# Patient Record
Sex: Male | Born: 1960 | Race: White | Hispanic: No | Marital: Married | Smoking: Former smoker
Health system: Southern US, Community
[De-identification: ages and names within clinical notes are randomized; demographics above are authoritative.]

## PROBLEM LIST (undated history)

## (undated) DIAGNOSIS — G4730 Sleep apnea, unspecified: Secondary | ICD-10-CM

## (undated) DIAGNOSIS — T7840XA Allergy, unspecified, initial encounter: Secondary | ICD-10-CM

## (undated) DIAGNOSIS — E663 Overweight: Secondary | ICD-10-CM

## (undated) DIAGNOSIS — E782 Mixed hyperlipidemia: Secondary | ICD-10-CM

## (undated) DIAGNOSIS — I10 Essential (primary) hypertension: Secondary | ICD-10-CM

## (undated) DIAGNOSIS — K219 Gastro-esophageal reflux disease without esophagitis: Secondary | ICD-10-CM

## (undated) DIAGNOSIS — I219 Acute myocardial infarction, unspecified: Secondary | ICD-10-CM

## (undated) DIAGNOSIS — I2510 Atherosclerotic heart disease of native coronary artery without angina pectoris: Secondary | ICD-10-CM

## (undated) HISTORY — DX: Acute myocardial infarction, unspecified: I21.9

## (undated) HISTORY — PX: OTHER SURGICAL HISTORY: SHX169

## (undated) HISTORY — DX: Sleep apnea, unspecified: G47.30

## (undated) HISTORY — DX: Allergy, unspecified, initial encounter: T78.40XA

## (undated) HISTORY — DX: Atherosclerotic heart disease of native coronary artery without angina pectoris: I25.10

## (undated) HISTORY — DX: Essential (primary) hypertension: I10

## (undated) HISTORY — DX: Overweight: E66.3

## (undated) HISTORY — DX: Gastro-esophageal reflux disease without esophagitis: K21.9

## (undated) HISTORY — DX: Mixed hyperlipidemia: E78.2

## (undated) HISTORY — PX: CORONARY ANGIOPLASTY: SHX604

## (undated) HISTORY — PX: CORONARY STENT PLACEMENT: SHX1402

---

## 2006-02-13 DIAGNOSIS — I219 Acute myocardial infarction, unspecified: Secondary | ICD-10-CM

## 2006-02-13 HISTORY — DX: Acute myocardial infarction, unspecified: I21.9

## 2006-06-08 ENCOUNTER — Ambulatory Visit: Payer: Self-pay | Admitting: Cardiology

## 2006-06-13 ENCOUNTER — Encounter: Payer: Self-pay | Admitting: Physician Assistant

## 2006-06-14 ENCOUNTER — Encounter: Payer: Self-pay | Admitting: Physician Assistant

## 2006-06-14 ENCOUNTER — Ambulatory Visit: Payer: Self-pay | Admitting: Cardiology

## 2006-07-20 ENCOUNTER — Ambulatory Visit: Payer: Self-pay | Admitting: Cardiology

## 2006-09-21 ENCOUNTER — Encounter: Payer: Self-pay | Admitting: Cardiology

## 2007-02-15 ENCOUNTER — Encounter: Payer: Self-pay | Admitting: Physician Assistant

## 2007-02-26 ENCOUNTER — Ambulatory Visit: Payer: Self-pay | Admitting: Cardiology

## 2007-07-05 ENCOUNTER — Encounter: Payer: Self-pay | Admitting: Cardiology

## 2007-10-04 ENCOUNTER — Ambulatory Visit: Payer: Self-pay | Admitting: Cardiology

## 2008-10-07 DIAGNOSIS — I10 Essential (primary) hypertension: Secondary | ICD-10-CM | POA: Insufficient documentation

## 2008-10-07 DIAGNOSIS — E663 Overweight: Secondary | ICD-10-CM | POA: Insufficient documentation

## 2008-10-07 DIAGNOSIS — E785 Hyperlipidemia, unspecified: Secondary | ICD-10-CM | POA: Insufficient documentation

## 2008-10-07 DIAGNOSIS — E1159 Type 2 diabetes mellitus with other circulatory complications: Secondary | ICD-10-CM | POA: Insufficient documentation

## 2008-10-07 DIAGNOSIS — I2510 Atherosclerotic heart disease of native coronary artery without angina pectoris: Secondary | ICD-10-CM | POA: Insufficient documentation

## 2008-10-07 DIAGNOSIS — Z9861 Coronary angioplasty status: Secondary | ICD-10-CM

## 2008-10-08 ENCOUNTER — Encounter (INDEPENDENT_AMBULATORY_CARE_PROVIDER_SITE_OTHER): Payer: Self-pay | Admitting: *Deleted

## 2008-11-12 ENCOUNTER — Encounter: Payer: Self-pay | Admitting: Physician Assistant

## 2008-11-12 ENCOUNTER — Ambulatory Visit: Payer: Self-pay | Admitting: Cardiology

## 2008-11-12 DIAGNOSIS — Z716 Tobacco abuse counseling: Secondary | ICD-10-CM | POA: Insufficient documentation

## 2008-12-01 ENCOUNTER — Encounter (INDEPENDENT_AMBULATORY_CARE_PROVIDER_SITE_OTHER): Payer: Self-pay | Admitting: *Deleted

## 2009-06-01 ENCOUNTER — Encounter (INDEPENDENT_AMBULATORY_CARE_PROVIDER_SITE_OTHER): Payer: Self-pay | Admitting: *Deleted

## 2009-06-24 ENCOUNTER — Encounter: Payer: Self-pay | Admitting: Cardiology

## 2010-03-02 ENCOUNTER — Ambulatory Visit
Admission: RE | Admit: 2010-03-02 | Discharge: 2010-03-02 | Payer: Self-pay | Source: Home / Self Care | Attending: Cardiology | Admitting: Cardiology

## 2010-03-02 ENCOUNTER — Encounter (INDEPENDENT_AMBULATORY_CARE_PROVIDER_SITE_OTHER): Payer: Self-pay | Admitting: *Deleted

## 2010-03-02 ENCOUNTER — Encounter: Payer: Self-pay | Admitting: Cardiology

## 2010-03-02 DIAGNOSIS — R072 Precordial pain: Secondary | ICD-10-CM | POA: Insufficient documentation

## 2010-03-04 ENCOUNTER — Encounter: Payer: Self-pay | Admitting: Cardiology

## 2010-03-15 NOTE — Letter (Signed)
Summary: Certified Letter-Returned "Unclaimed"  Certified Letter-Returned "Unclaimed"   Imported By: Cyril Loosen, RN, BSN 07/16/2009 10:19:50  _____________________________________________________________________  External Attachment:    Type:   Image     Comment:   External Document  Appended Document: Certified Letter-Returned "Unclaimed" Left message for pt to call regarding lab work that has been pending since 11/12/09.

## 2010-03-15 NOTE — Letter (Signed)
Summary: Generic Engineer, agricultural at [facility] [address]   [place], [place] [address]   Phone: [phone]  Fax: [phone]        June 01, 2009 MRN: [id number]    Scott Hatfield [address] [place], [place]  [address]    Dear [name],   You are past due for lab work to check your cholesterol and liver function. This was actually due the first of October. However, it does not appear this has been done.   Please take the enclosed order to the [facility] AS SOON AS POSSIBLE to have this done. Do not eat or drink after midnight. If you will not be able to do this lab work or have done it with your primary MD, please notify our office so that we can properly document this in your chart.    Sincerely,  Cyril Loosen, RN, BSN  This letter has been electronically signed by your physician.

## 2010-03-17 NOTE — Letter (Signed)
Summary: Stress Echocardiography  [facility] [address]   [place], [place] [address]   Phone: [phone]  Fax: [phone]      [facility] Cardiovascular Services  Stress Echocardiography    Vivi Martens  Appointment Date:_  Appointment Time:_   Your doctor has ordered a stress echo to help determine the condition of your heart during exercise. If you take blood pressure medication, ask your doctor if you should take it the day of your test. You should not have anything to eat or drink at least 4 hours before your test is scheduled.  You will be asked to undress from the waist up and given a hospital gown to wear, so dress comfortably from the waist down for example: Sweat pants, shorts, or skirt Rubber soled lace up shoes (tennis shoes)  You will need to register at the Outpatient/Main Entrance at the hospital 15 minutes before your appointment time. It is a good idea to bring a copy of your order with you. They will direct you to the Cardiovascular Department on the third floor.   Plan on about an hour and a half  from registration to release from the hospital

## 2010-03-17 NOTE — Assessment & Plan Note (Signed)
Summary: 1 yr ful  Medications Added AMLODIPINE BESYLATE 5 MG TABS (AMLODIPINE BESYLATE) Take 1 tablet by mouth once a day      Allergies Added: NKDA  Visit Type:  Follow-up Primary Provider:  Prudy Feeler   History of Present Illness: the patient is a 50 year old male with history of single vessel coronary artery disease status post stent placement to the circumflex in 2008. The patient had a negative Cardiolite stress study in May of 2008 following his procedure. The patient is scheduled for a DOT physical and has been noted to have high blood pressures of 180/87. He also needs a stress test prior to this encounter.  Cardiovascularperspective the patient appears to be doing well. He does report occasional intermittent symptoms of chest discomfort which appeared to be nonexertional. Had been going on for a few months. They have been very short lived in duration. There has been some pain also in the left arm but no associated shortness of breath or diaphoresis. He reports also no palpitations or syncope.  Unfortunate patient continues to smoke. He also has not had his lipid panel checked and liver function tests in quite some time.  Preventive Screening-Counseling & Management  Alcohol-Tobacco     Smoking Status: current     Smoking Cessation Counseling: yes     Packs/Day: 1 PPD  Current Medications (verified): 1)  Metoprolol Succinate 25 Mg Xr24h-Tab (Metoprolol Succinate) .... Take One Tablet By Mouth Daily 2)  Simvastatin 80 Mg Tabs (Simvastatin) .... Take 1 Tablet By Mouth Once A Day At Bedtime 3)  Lisinopril-Hydrochlorothiazide 20-25 Mg Tabs (Lisinopril-Hydrochlorothiazide) .... Take 1 Tablet By Mouth Once A Day 4)  Aspirin 81 Mg Tbec (Aspirin) .... Take One Tablet By Mouth Daily 5)  Fish Oil 1000 Mg Caps (Omega-3 Fatty Acids) .... Take 1 Tablet By Mouth Once A Day 6)  Nitroglycerin 0.4 Mg Subl (Nitroglycerin) .... One Tablet Under Tongue Every 5 Minutes As Needed For Chest  Pain---May Repeat Times Three 7)  Amlodipine Besylate 5 Mg Tabs (Amlodipine Besylate) .... Take 1 Tablet By Mouth Once A Day  Allergies (verified): No Known Drug Allergies  Comments:  Nurse/Medical Assistant: The patient's medication bottles and allergies were reviewed with the patient and were updated in the Medication and Allergy Lists.  Past History:  Family History: Last updated: 10/07/2008 Family History of Coronary Artery Disease:   Social History: Last updated: 10/07/2008 Full Time Married  Tobacco Use - Yes.  Alcohol Use - yes Regular Exercise - no Drug Use - no  Risk Factors: Smoking Status: current (03/02/2010) Packs/Day: 1 PPD (03/02/2010)  Past Medical History: OVERWEIGHT/OBESITY (ICD-278.02) HYPERTENSION, UNSPECIFIED (ICD-401.9) HYPERLIPIDEMIA-MIXED (ICD-272.4) CAD, NATIVE VESSEL (ICD-414.01) Single-vessel coronary artery disease with stent placement to the circumflex coronary artery in 2008 May 2008 negative stress Cardiolite study. GERD  Social History: Packs/Day:  1 PPD  Review of Systems       The patient complains of chest pain.  The patient denies fatigue, malaise, fever, weight gain/loss, vision loss, decreased hearing, hoarseness, palpitations, shortness of breath, prolonged cough, wheezing, sleep apnea, coughing up blood, abdominal pain, blood in stool, nausea, vomiting, diarrhea, heartburn, incontinence, blood in urine, muscle weakness, joint pain, leg swelling, rash, skin lesions, headache, fainting, dizziness, depression, anxiety, enlarged lymph nodes, easy bruising or bleeding, and environmental allergies.    Vital Signs:  Patient profile:   50 year old male Height:      71 inches Weight:      260 pounds BMI:  36.39 Pulse rate:   87 / minute BP sitting:   149 / 97  (left arm) Cuff size:   large  Vitals Entered By: Carlye Grippe (March 02, 2010 8:24 AM)  Nutrition Counseling: Patient's BMI is greater than 25 and therefore  counseled on weight management options.  Physical Exam  Additional Exam:  General: Well-developed, well-nourished in no distress head: Normocephalic and atraumatic eyes PERRLA/EOMI intact, conjunctiva and lids normal nose: No deformity or lesions mouth normal dentition, normal posterior pharynx neck: Supple, no JVD.  No masses, thyromegaly or abnormal cervical nodes lungs: Normal breath sounds bilaterally without wheezing.  Normal percussion heart: regular rate and rhythm with normal S1 and S2, no S3 or S4.  PMI is normal.  No pathological murmurs abdomen: Normal bowel sounds, abdomen is soft and nontender without masses, organomegaly or hernias noted.  No hepatosplenomegaly musculoskeletal: Back normal, normal gait muscle strength and tone normal pulsus: Pulse is normal in all 4 extremities Extremities: No peripheral pitting edema neurologic: Alert and oriented x 3 skin: Intact without lesions or rashes cervical nodes: No significant adenopathy psychologic: Normal affect    EKG  Procedure date:  03/02/2010  Findings:      normal sinus rhythm. Nonspecific ST-T wave changes. Heart rate 90 beats per minute  Impression & Recommendations:  Problem # 1:  HYPERTENSION, UNSPECIFIED (ICD-401.9) blood pressure poorly controlled. We will add amlodipine 5 mg p.o. q. daily. His updated medication list for this problem includes:    Metoprolol Succinate 25 Mg Xr24h-tab (Metoprolol succinate) .Marland Kitchen... Take one tablet by mouth daily    Lisinopril-hydrochlorothiazide 20-25 Mg Tabs (Lisinopril-hydrochlorothiazide) .Marland Kitchen... Take 1 tablet by mouth once a day    Aspirin 81 Mg Tbec (Aspirin) .Marland Kitchen... Take one tablet by mouth daily    Amlodipine Besylate 5 Mg Tabs (Amlodipine besylate) .Marland Kitchen... Take 1 tablet by mouth once a day  Problem # 2:  PRECORDIAL PAIN (ICD-786.51) chest pain appears atypical. 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 I have ordered an exercise echocardiogram and specifically told the patient not to take his beta blocker the day before and today off the procedure. He can continue his other antihypertensive medications prior to stress testing. His updated medication list for this problem includes:    Metoprolol Succinate 25 Mg Xr24h-tab (Metoprolol succinate) .Marland Kitchen... Take one tablet by mouth daily    Lisinopril-hydrochlorothiazide 20-25 Mg Tabs (Lisinopril-hydrochlorothiazide) .Marland Kitchen... Take 1 tablet by mouth once a day    Aspirin 81 Mg Tbec (Aspirin) .Marland Kitchen... Take one tablet by mouth daily    Nitroglycerin 0.4 Mg Subl (Nitroglycerin) ..... One tablet under tongue every 5 minutes as needed for chest pain---may repeat times three    Amlodipine Besylate 5 Mg Tabs (Amlodipine besylate) .Marland Kitchen... Take 1 tablet by mouth once a day  Orders: Echo- Stress (Stress Echo)  Problem # 3:  CAD, NATIVE VESSEL (ICD-414.01) single-vessel coronary artery disease with stent placement to circumflex coronary artery 2008. 12-lead electrocardiogram in the office today shows no acute ischemic changes. His updated medication list for this problem includes:    Metoprolol Succinate 25 Mg Xr24h-tab (Metoprolol succinate) .Marland Kitchen... Take one tablet by mouth daily    Lisinopril-hydrochlorothiazide 20-25 Mg Tabs (Lisinopril-hydrochlorothiazide) .Marland Kitchen... Take 1 tablet by mouth once a day    Aspirin 81 Mg Tbec (Aspirin) .Marland Kitchen... Take one tablet by mouth daily    Nitroglycerin 0.4 Mg Subl (Nitroglycerin) ..... One tablet under  tongue every 5 minutes as needed for chest pain---may repeat times three    Amlodipine Besylate 5 Mg Tabs (Amlodipine besylate) .Marland Kitchen... Take 1 tablet by mouth once a day  Orders: EKG w/ Interpretation ([address]) T-Lipid Profile ([address]-60454) T-Hepatic Function ([address]11-91478) Echo- Stress (Stress Echo)  Problem # 4:  HYPERLIPIDEMIA-MIXED (ICD-272.4) lipid panel and LFTs will be obtained. His updated medication list for this  problem includes:    Simvastatin 80 Mg Tabs (Simvastatin) .Marland Kitchen... Take 1 tablet by mouth once a day at bedtime  Orders: T-Lipid Profile ([address]562-13086) T-Hepatic Function [phone])  Patient Instructions: 1)  Begin Amlodipine 5mg  dailyl 2)  Stress Echo for DOT  3)  Labs:  FLP, LFT 4)  Follow up in  6 months Prescriptions: NITROGLYCERIN 0.4 MG SUBL (NITROGLYCERIN) One tablet under tongue every 5 minutes as needed for chest pain---may repeat times three  #25 x 2   Entered by:   Hoover Brunette, LPN   Authorized by:   Lewayne Bunting, MD, [facility]   Signed by:   Hoover Brunette, LPN on 28/41/3244   Method used:   Electronically to        [facility]  [address] (retail)       [address]       [place], [place]  [address]       Ph: [phone]       Fax: [phone]   RxID:   [id number] LISINOPRIL-HYDROCHLOROTHIAZIDE 20-25 MG TABS (LISINOPRIL-HYDROCHLOROTHIAZIDE) Take 1 tablet by mouth once a day  #30 Each x 6   Entered by:   Hoover Brunette, LPN   Authorized by:   Lewayne Bunting, MD, [facility]   Signed by:   Hoover Brunette, LPN on 63/02/6008   Method used:   Electronically to        [facility]  [address] (retail)       [address]       [place], [place]  [address]       Ph: [phone]       Fax: [phone]   RxID:   [id number] SIMVASTATIN 80 MG TABS (SIMVASTATIN) Take 1 tablet by mouth once a day at bedtime  #30 Each x 6   Entered by:   Hoover Brunette, LPN   Authorized by:   Lewayne Bunting, MD, [facility]   Signed by:   Hoover Brunette, LPN on 94/85/4627   Method used:   Electronically to        [facility]  [address] (retail)       [address]       [place], [place]  [address]       Ph: [phone]       Fax: [phone]   RxID:   [id number] METOPROLOL SUCCINATE 25 MG XR24H-TAB (METOPROLOL SUCCINATE) Take one tablet by mouth daily  #30 Each x 6   Entered by:   Hoover Brunette, LPN   Authorized by:   Lewayne Bunting, MD, [facility]   Signed by:   Hoover Brunette, LPN on  23/53/6144   Method used:   Electronically to        [facility]  [address] (retail)       [address]       [place], [place]  [address]       Ph: [phone]  Fax: [phone]   RxID:   [id number] AMLODIPINE BESYLATE 5 MG TABS (AMLODIPINE BESYLATE) Take 1 tablet by mouth once a day  #30 x 6   Entered by:   Hoover Brunette, LPN   Authorized by:   Lewayne Bunting, MD, [facility]   Signed by:   Hoover Brunette, LPN on 02/15/7251   Method used:   Electronically to        [facility]  [address] (retail)       [address]       [place], [place]  [address]       Ph: [phone]       Fax: [phone]   RxID:   [id number]   Handout requested.

## 2010-06-28 NOTE — Assessment & Plan Note (Signed)
[facility]                          [facility] NOTE   Scott Hatfield, Scott Hatfield                       MRN:          [id number]  DATE:10/04/2007                            DOB:          11-11-1960    PRIMARY CARDIOLOGIST:  Learta Codding, MD,FACC   REASON FOR VISIT:  A 51-month followup.   Scott Hatfield reports no interim development of symptoms suggestive of  unstable angina pectoris.  He has lost 16 pounds since his last visit.  Unfortunately, he continues to smoke.  He is otherwise compliant with  his medications.  He also continues to work as a Naval architect, but  reports that his hours have been diminished somewhat.   EKG in our office today shows  NSR at 80 bpm with nonspecific ST  changes.   CURRENT MEDICATIONS:  1. Plavix.  2. Aspirin 81 daily.  3. Toprol-XL 25 daily.  4. Lisinopril and hydrochlorothiazide 20/25 daily.  5. Simvastatin 80 daily.  6. Fish oil 1000 daily.   PHYSICAL EXAMINATION:  VITAL SIGNS:  Blood pressure 134/85, pulse 78,  regular, weight 259 (down 16).  GENERAL:  A 50 year old male, moderately obese, sitting upright, no  distress.  HEENT:  Normocephalic and atraumatic.  NECK:  Palpable bilateral carotid pulses without bruits; no JVD.  LUNGS:  Diminished breath sounds at bases, but without crackles or  wheezes.  HEART:  RRR (S1S2), no significant murmurs.  ABDOMEN:  Protuberant, nontender.  EXTREMITIES:  No significant edema.  NEURO:  No focal deficit.   IMPRESSION:  1. Single-vessel CAD, quiescent.      a.     Status post drug-eluting stenting of circumflex artery,       February 2008, [place], [place].      b.     Preserved LVF.      c.     Normal adequate exercise stress Cardiolite; EF 57%, May       2008.  2. Hypertension.  3. Dyslipidemia.  4. Ongoing tobacco.  5. Gastroesophageal reflux disease.  6. Obesity.   PLAN:  1. Complete current supply of Plavix, then stop.  The patient is now      one and half years out since undergoing drug-eluting stenting.  He      has significant financial constraints and, therefore, I feel that      he does not need to continue on this medication.  He has satisfied      the minimal requirements for remaining on Plavix for at least 1      year, as had been outlined at the time of his procedure.  He is,      however, to remain on low-dose aspirin indefinitely.  2. Aggressive lipid management as recommended.  Most recent profile      from this past May shows an LDL of 140 with an HDL of 24,      triglyceride 122, and cholesterol of 188.  He is currently on      simvastatin 80 daily and 1 g of fish oil.  We will increase fish  oil to 2 g a day and repeat a FLP/LFT in 12 weeks.  3. The patient is strongly advised to cut back on his saturated fat      intake.  He is encouraged to engage in a regular exercise program.  4. Prescribe Nicoderm transdermal nicotine patches (21 mg/hour), to      avail himself of the program which is currently being supported by      [facility].  5. Renew prescription for p.r.n. nitroglycerin.  6. Schedule return clinic follow up with myself and Dr. Andee Lineman in 1      year, or sooner as needed.      Rozell Searing, PA-C  Electronically Signed      Luis Abed, MD, [facility]  Electronically Signed   GS/MedQ  DD: 10/04/2007  DT: 10/05/2007  Job #: [id number]   cc:   Samuel Jester

## 2010-06-28 NOTE — Assessment & Plan Note (Signed)
[facility]                          [facility] NOTE   LUZ, BURCHER                       MRN:          [id number]  DATE:07/20/2006                            DOB:          1960-12-04    HISTORY OF PRESENT ILLNESS:  The patient is a 50 year old male with a  history of single-vessel coronary artery disease.  Patient is status  post stent placement to the circumflex at [facility].  The  patient is on aspirin and Plavix.  He has been doing well.  He did  report substernal chest pain during his last office visit, and was set  up for a Cardiolite perfusion study.  The ejection fraction was 57%, and  there was no ischemia.  The patient stated that the Saturday his stress  test, he had a single episode of chest pain lasting less than an hour.  However, he reports no recurrence, and no exertional chest pain.  He  also has no shortness of breath, orthopnea, or PND.  He was also started  on beta blocker last office visit, as well as Nexium.  He stated he is  doing quite well now.   MEDICATIONS:  1. Simvastatin 40 mg nightly.  2. Chantix 1mg  p.o. b.i.d.  3. Plavix 75 mg daily.  4. Lisinopril/hydrochlorothiazide 20/25 daily.  5. Aspirin 81 daily.  6. Toprol XL 25 daily.  7. Nexium 40 mg p.o. daily.   PHYSICAL EXAMINATION:  VITAL SIGNS:  Blood pressure 116/77.  Heart rate  85.  Weight is 265 pounds.  GENERAL:  A well-nourished white male, in no apparent distress.  HEENT:  Eyes are clear.  Conjunctivae clear.  NECK:  Supple.  Normal carotid upstroke.  No carotid bruits.  LUNGS:  Clear breath sounds bilaterally.  HEART:  Regular rate and rhythm.  Normal S1 and S2.  ABDOMEN:  Soft.  EXTREMITY EXAM:  No edema.   PROBLEM LIST:  1. Single-vessel coronary artery disease.      a.     Status post drug-eluting stent to the circumflex in       [place], [place] February 2008.      b.     Preserved left ventricular  function.      c.     Negative Cardiolite for ischemia 1 week ago.  2. Chest pain, resolved.  3. Hypertension.  4. Dyslipidemia.  5. Tobacco abuse.   PLAN:  1. Lipid panel was reviewed.  His LDL is 112.  He will continue to      adjust his diet.  We will check his lipids in 8 weeks.  If still      elevated LDL, we will increase simvastatin.  2. The patient's chest pain has resolved, and had no ischemic by      Cardiolite.  We will continue with current medical therapy.  3. The patient will follow up with [place] in 6 months.     Learta Codding, MD,FACC  Electronically Signed    GED/MedQ  DD: 07/20/2006  DT: 07/20/2006  Job #: [id number]   cc:  Dr. Charm Barges

## 2010-06-28 NOTE — Assessment & Plan Note (Signed)
[facility]                          [facility]   IRAM, LUNDBERG                       MRN:          [id number]  DATE:02/26/2007                            DOB:          [birth year]    HISTORY OF PRESENT ILLNESS:  Patient is a 50 year old male with history  of single vessel coronary artery status post stent placement to  circumflex coronary artery at [facility].  The patient had  last year a Cardiolite study done which was negative for ischemia.  Since that time he had one single episode of left-sided arm pain and  chest pain.  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  An EKG was done  which showed no acute ischemic changes.  The patient states he has been  doing well.  He has had no recurrent substernal chest on exertion.  Unfortunately, he is noncompliant with medical therapy, particularly his  cholesterol-lowering medications, and he also continues to smoke.  He  had recent laboratory work done including lipid panel, liver function  test and has demonstrated an LDL of 160 and a total cholesterol of 221,  HDL of 25.   Remainder of review of systems essentially within normal limits.  The  patient denies any orthopnea or PND.  I did note that his blood pressure  was elevated today but the patient assured me that on his blood pressure  cuff at home his readings have been otherwise within normal limits.   MEDICATIONS:  1. Simvastatin 40 mg p.o. nightly but the patient is not compliant.  2. Plavix 75 mg p.o. daily.  3. Lisinopril/hydrochlorothiazide 25 mg p.o. daily.  4. Aspirin 81 mg a day.  5. Toprol XL 25 mg a day.  6. Fish oil 1000 mg p.o. daily.   PHYSICAL EXAMINATION:  VITAL SIGNS:  Blood pressure 152/94, heart rate  89, weight 275 pounds.  NECK:  Normal carotid upstroke and no carotid bruits.  LUNGS:  Clear breath sounds bilaterally.  HEART:  Regular rate and rhythm with normal S1-S2.  No murmur, rubs or  gallops.  ABDOMEN:  Soft, nontender, no rebound or guarding.  Good bowel sounds.  EXTREMITY:  No cyanosis, clubbing or edema.   A 12 lead echocardiogram normal sinus rhythm with nonspecific T-wave  changes.   PROBLEM LIST:  1. Single vessel coronary artery disease.      a.     Status post drug-eluting stent to the circumflex in       [place], [place] February 2008.      b.     Normal left ventricular function.      c.     Negative Cardiolite stress study 1 year ago.  2. Atypical chest pain, resolved.  3. Hypertension.  4. Dyslipidemia.  5. Ongoing tobacco use.   PLAN:  The patient has had no recurrent substernal chest pain for the  last several weeks.  His EKG is within normal limits.  He does not need  any further stress testing at the present time.  1. The most important issues are the patient's noncompliance with his  statin drug therapy and his diet.  I have again stressed to the      patient that he needs to change his lifestyle and stop smoking.  He      also needs to start an exercise program.  The patient will increase      simvastatin to 80 mg p.o. nightly.  2. The patient will follow-up also with [place] in 6 months.  I asked him      to carefully review his blood pressure readings at home and to call      [place] back in case the blood pressure is elevated and we will give him      prescription of amlodipine 10 mg daily.     Learta Codding, MD,FACC  Electronically Signed    GED/MedQ  DD: 02/26/2007  DT: 02/26/2007  Job #: [id number]   cc:   Dr. Charm Barges

## 2010-07-01 NOTE — Assessment & Plan Note (Signed)
[facility] ([facility]) HEALTHCARE                          [facility] NOTE   Scott Hatfield, Scott Hatfield                       MRN:          [id number]  DATE:06/08/2006                            DOB:          [birth year]    REFERRING PHYSICIAN:  Samuel Jester   REASON FOR CONSULTATION:  Scott Hatfield is a very pleasant 50 year old male  with no prior history of heart disease, but with recently-documented  single-vessel coronary artery disease, treated with a drug eluting stent  of the circumflex artery at [facility] [place].   Patient has numerous cardiac risk factors, including tobacco-smoking,  and apparently was in [place] to be with his daughter, who was  expecting her first child at that time.  While he was in the hospital,  and in fact, was getting ready to leave, he experienced severe chest  pain with radiation down the left arm and was subsequently evaluated in  the emergency room.  He recalls having relief after the second  nitroglycerin tablet and was instructed to be admitted for overnight  observation, given that his resting electrocardiogram showed some  abnormalities, as well.  He was then cleared to proceed with an exercise  stress test, and he denies any associated chest pain, but does recall  feeling quite fatigued.  The perfusion images apparently were abnormal  and he was referred for a cardiac catheterization the following day,  revealing a significant mid-circumflex lesion.  A photo of the results,  as well as the accompanying DVD, have been provided, but the hospital  records are currently pending.   Since his hospitalization, patient denies any exertional chest  discomfort; however, he has had at least two episodes of chest pain,  which have been particularly significant and which he feels are new.  However, these both occurred at rest and the last one was about a week  ago, while he was  lying on the couch.  It is not similar to the  heartburn that he has experienced in the past, nor to the symptoms that  he had prior to his stenting.  Of note, there was no radiation down the  left arm, however, as there had been in the past.  The episode of last  week lasted approximately a few hours and he has not had any recurrent  symptoms.  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  He is also trying very hard to stop smoking and was discharged on  Chantix.   Of note, patient was not discharged with p.r.n. nitroglycerin.   ALLERGIES:  No known drug allergies.   CURRENT MEDICATIONS:  1. Plavix.  2. Aspirin 81 daily.  3. Lisinopril/HCTZ 20/25 daily.  4. Chantix as directed.  5. Simvastatin 40 daily.   PAST MEDICAL HISTORY:  1. Coronary artery disease.      a.     As outlined above.  2. Hypertension.  3. Hyperlipidemia.  4. Obesity.  5. GERD.   SOCIAL HISTORY:  Patient is married, has a total of three children.  He  has  resumed working as an Tax inspector.  He continues to smoke,  but is taking Chantix.  He drinks alcohol on occasion.   FAMILY HISTORY:  Mother age 37, status post myocardial infarction in her  early fifties.   REVIEW OF SYSTEMS:  Patient denies any current symptoms suggestive of  reflux disease.  Otherwise as per HPI.  Remaining systems negative.   PHYSICAL EXAM:  Blood pressure currently 147/85, pulse 91, weight 266.8.  GENERAL:  A 50 year old male, obese, sitting upright, in no distress.  HEENT:  Normocephalic, atraumatic.  NECK:  Palpable bilateral carotid pulses, without bruits.  LUNGS:  Clear to auscultation in all fields.  HEART:  Regular rate and rhythm (S1, S2).  No significant murmurs.  ABDOMEN:  Protuberant, nontender, with intact bowel sounds.  EXTREMITIES:  Palpable bilateral femoral pulses without bruits.  No  hematoma or ecchymoses overlying the right groin.  Distal extremities  with intact pulses and no  edema.  NEUROLOGIC:  No focal deficits.   IMPRESSION:  1. Single-vessel coronary artery disease.      a.     Status post drug eluting stent, mid-circumflex artery, in       [place], [place], February 2008.      b.     Preserved left ventricular function.  2. Recurrent chest pain.      a.     Atypical features.  3. GERD.  4. Hypertension.  5. Hyperlipidemia.  6. Ongoing tobacco.  7. Obesity.   PLAN:  1. Schedule exercise stress Cardiolite to exclude significant      underlying ischemia, particularly in the region corresponding with      recent stenting.  We feel, however, that the probability that these      symptoms represent ischemia is low.  2. Adjust medication regimen with the addition of Toprol XL 25 daily      for both blood pressure and high basal heart rate control, as well      as prophylaxis against recurrent angina pectoris.  3. Check followup fasting lipids, as well as liver profile.  4. Add Nexium 40 daily for reflux prophylaxis.  5. Encourage smoking cessation and compliance with Chantix.  6. Encourage initiation of a diet/exercise program.  7. Request records from [facility].  8. Schedule early clinic followup with myself and Dr. Andee Lineman in the      next several weeks, for review of stress test results and further      recommendations.      Gene Serpe, PA-C       Learta Codding, MD,FACC    GS/MedQ  DD: 06/08/2006  DT: 06/08/2006  Job #: [id number]   cc:   Prudy Feeler, P.A.-C.

## 2010-10-14 ENCOUNTER — Other Ambulatory Visit: Payer: Self-pay | Admitting: Orthopedic Surgery

## 2010-10-14 DIAGNOSIS — M25532 Pain in left wrist: Secondary | ICD-10-CM

## 2010-10-24 ENCOUNTER — Other Ambulatory Visit: Payer: Self-pay

## 2010-11-07 ENCOUNTER — Ambulatory Visit
Admission: RE | Admit: 2010-11-07 | Discharge: 2010-11-07 | Disposition: A | Discharge status: Home / Self Care | Payer: Private Health Insurance - Indemnity | Source: Ambulatory Visit | Attending: Orthopedic Surgery | Admitting: Orthopedic Surgery

## 2010-11-07 ENCOUNTER — Other Ambulatory Visit: Payer: Self-pay

## 2010-11-07 DIAGNOSIS — M25532 Pain in left wrist: Secondary | ICD-10-CM

## 2010-11-07 MED ORDER — IOHEXOL 180 MG/ML  SOLN
7.5000 mL | Freq: Once | INTRAMUSCULAR | Status: AC | PRN
  Administered 2010-11-07: 7.5 mL via INTRA_ARTICULAR

## 2010-11-18 ENCOUNTER — Encounter (HOSPITAL_BASED_OUTPATIENT_CLINIC_OR_DEPARTMENT_OTHER)
Admission: RE | Admit: 2010-11-18 | Discharge: 2010-11-18 | Disposition: A | Discharge status: Home / Self Care | Payer: Private Health Insurance - Indemnity | Source: Ambulatory Visit | Attending: Orthopedic Surgery | Admitting: Orthopedic Surgery

## 2010-11-18 LAB — BASIC METABOLIC PANEL
CO2: 27 mEq/L (ref 19–32)
Calcium: 9.5 mg/dL (ref 8.4–10.5)
Glucose, Bld: 105 mg/dL — ABNORMAL HIGH (ref 70–99)
Potassium: 4.1 mEq/L (ref 3.5–5.1)
Sodium: 138 mEq/L (ref 135–145)

## 2010-11-22 ENCOUNTER — Ambulatory Visit (HOSPITAL_BASED_OUTPATIENT_CLINIC_OR_DEPARTMENT_OTHER)
Admission: RE | Admit: 2010-11-22 | Discharge: 2010-11-22 | Disposition: A | Discharge status: Home / Self Care | Payer: Private Health Insurance - Indemnity | Source: Ambulatory Visit | Attending: Orthopedic Surgery | Admitting: Orthopedic Surgery

## 2010-11-22 DIAGNOSIS — I10 Essential (primary) hypertension: Secondary | ICD-10-CM | POA: Insufficient documentation

## 2010-11-22 DIAGNOSIS — F17200 Nicotine dependence, unspecified, uncomplicated: Secondary | ICD-10-CM | POA: Insufficient documentation

## 2010-11-22 DIAGNOSIS — I2510 Atherosclerotic heart disease of native coronary artery without angina pectoris: Secondary | ICD-10-CM | POA: Insufficient documentation

## 2010-11-22 DIAGNOSIS — M249 Joint derangement, unspecified: Secondary | ICD-10-CM | POA: Insufficient documentation

## 2010-11-22 DIAGNOSIS — Z01812 Encounter for preprocedural laboratory examination: Secondary | ICD-10-CM | POA: Insufficient documentation

## 2010-11-29 NOTE — Op Note (Signed)
Scott Hatfield, Scott Hatfield              ACCOUNT NO.:  [id number]  MEDICAL RECORD NO.:  [id number]  LOCATION:                                 FACILITY:  PHYSICIAN:  Cindee Salt, M.D.            DATE OF BIRTH:  DATE OF PROCEDURE:  11/22/2010 DATE OF DISCHARGE:                              OPERATIVE REPORT   PREOPERATIVE DIAGNOSIS:  Scapholunate tear, left wrist.  POSTOPERATIVE DIAGNOSIS:  Scapholunate tear, left wrist.  OPERATION:  Arthroscopy of the left wrist with shrinkage of scapholunate ligament.  SURGEON:  Cindee Salt, MD  ASSIST:  Betha Loa, MD  ANESTHESIA:  General with local infiltration.  HISTORY:  The patient is a 50 year old male with a history of injury to his left wrist with wrist pain.  He has had an MRI of his wrist revealing a injury to the scapholunate ligament.  He is admitted now for arthroscopy of the wrist with possible shrinkage and debridement as dictated by findings.  He is aware that there is no guarantee with the surgery; possibility of infection; recurrence injury to arteries, nerves, tendons; incomplete relief of symptoms; dystrophy; possibility of further procedures being necessary for the type of injury he has. Preoperative area, the patient is seen.  The extremity marked by both the patient and surgeon.  Antibiotic given.  PROCEDURE:  The patient was brought to the operating room where a general anesthetic was carried out without difficulty.  He was prepped using ChloraPrep, supine position, left arm free.  A 3-minute dry time was allowed.  Time-out taken confirming the patient and procedure.  The left arm was placed in the ARC arthroscopy tower and 10 pounds traction applied.  The joint inflated to 3/4 portal.  A transverse incision was made and deepened with a hemostat.  Blunt trocar used to enter the joint.  Joint was inspected.  The scapholunate ligament showed a Geissler type 1 with significant stretching with a very patulous SL ligament.   A moderate synovitis was present around the entire joint. The cartilage showed no significant changes on the of the distal radius or the proximal carpal row.  Lunotriquetral joint was intact.  An irrigation catheter was placed in 6-UA, 6-R portal then opened after localization with a 22-gauge needle.  Transverse incision made, again deepened with a hemostat.  Blunt trocar used to enter the joint.  An [place] Border wand was then inserted with adequate flow.  A debridement of the synovial tissue was then performed.  The scope was introduced in the 6R portal.  The patulous nature of the scapholunate was immediately apparent.  There was fraying of the volar radiocarpal ligaments, but these appeared to be intact.  The midcarpal joint was inspected next after localization with a 22-gauge needle inflation of the joint.  The joint was entered on the ulnar midcarpal portal.  Transverse incision was made deepened with a hemostat.  Blunt trocar used to enter the joint.  Joint was inspected.  The instability of scapholunate was apparent.  Cartilage showed no changes on the proximal or distal carpal row.  The hamate showed no changes.  There was no instability of the lunotriquetral joint.  The scope was reintroduced in the 6R portal.  The Arthur wand inserted through the 3/4 and a shrinkage to the scapholunate ligament complex was then performed and dorsal portion was then packed. A debridement of the volar synovial tissue was then performed.  No significant shrinkage of the volar ligaments was performed.  This significantly tightened the scapholunate ligament.  The joint was then inspected through the midcarpal portal.  No gross instability was noted. The instruments were removed.  The portals were closed with interrupted 5-0 Vicryl Rapide and a local infiltration with 0.25% Marcaine without epinephrine was then given, approximately 3 mL was used.  This was injected only into the portals not into the joint.   A dorsal-palmar- radial-thumb spica splint was then applied.  The tourniquet was not inflated throughout the procedure.  The limb removed from the arthroscopy tower and the patient was taken to the recovery room for observation in satisfactory condition.  He will be discharged home to return to the [facility] [place] in 1 week, on Vicodin.          ______________________________ Cindee Salt, M.D.     GK/MEDQ  D:  11/22/2010  T:  11/22/2010  Job:  [id number]  Electronically Signed by Cindee Salt M.D. on 11/29/2010 04:27:08 PM

## 2010-12-03 ENCOUNTER — Inpatient Hospital Stay (HOSPITAL_COMMUNITY)
Admission: EM | Admit: 2010-12-03 | Discharge: 2010-12-06 | DRG: 247 | Disposition: A | Discharge status: Home / Self Care | Payer: Private Health Insurance - Indemnity | Source: Ambulatory Visit | Attending: Cardiology | Admitting: Cardiology

## 2010-12-03 DIAGNOSIS — I2129 ST elevation (STEMI) myocardial infarction involving other sites: Principal | ICD-10-CM | POA: Diagnosis present

## 2010-12-03 DIAGNOSIS — Z23 Encounter for immunization: Secondary | ICD-10-CM

## 2010-12-03 DIAGNOSIS — K219 Gastro-esophageal reflux disease without esophagitis: Secondary | ICD-10-CM | POA: Diagnosis present

## 2010-12-03 DIAGNOSIS — R079 Chest pain, unspecified: Secondary | ICD-10-CM

## 2010-12-03 DIAGNOSIS — I2510 Atherosclerotic heart disease of native coronary artery without angina pectoris: Secondary | ICD-10-CM

## 2010-12-03 DIAGNOSIS — Z79899 Other long term (current) drug therapy: Secondary | ICD-10-CM

## 2010-12-03 DIAGNOSIS — E785 Hyperlipidemia, unspecified: Secondary | ICD-10-CM | POA: Diagnosis present

## 2010-12-03 DIAGNOSIS — Z7982 Long term (current) use of aspirin: Secondary | ICD-10-CM

## 2010-12-03 DIAGNOSIS — Z9861 Coronary angioplasty status: Secondary | ICD-10-CM

## 2010-12-03 DIAGNOSIS — Z8249 Family history of ischemic heart disease and other diseases of the circulatory system: Secondary | ICD-10-CM

## 2010-12-03 DIAGNOSIS — I10 Essential (primary) hypertension: Secondary | ICD-10-CM | POA: Diagnosis present

## 2010-12-03 DIAGNOSIS — F17200 Nicotine dependence, unspecified, uncomplicated: Secondary | ICD-10-CM | POA: Diagnosis present

## 2010-12-03 DIAGNOSIS — E669 Obesity, unspecified: Secondary | ICD-10-CM | POA: Diagnosis present

## 2010-12-03 LAB — APTT: aPTT: 29 seconds (ref 24–37)

## 2010-12-03 LAB — POCT I-STAT, CHEM 8
BUN: 20 mg/dL (ref 6–23)
Creatinine, Ser: 0.9 mg/dL (ref 0.50–1.35)
Glucose, Bld: 160 mg/dL — ABNORMAL HIGH (ref 70–99)
Sodium: 139 mEq/L (ref 135–145)
TCO2: 21 mmol/L (ref 0–100)

## 2010-12-03 LAB — COMPREHENSIVE METABOLIC PANEL
AST: 16 U/L (ref 0–37)
Albumin: 3.6 g/dL (ref 3.5–5.2)
Alkaline Phosphatase: 60 U/L (ref 39–117)
BUN: 18 mg/dL (ref 6–23)
Chloride: 103 mEq/L (ref 96–112)
Potassium: 3.7 mEq/L (ref 3.5–5.1)
Total Bilirubin: 0.3 mg/dL (ref 0.3–1.2)

## 2010-12-03 LAB — PROTIME-INR: INR: 0.89 (ref 0.00–1.49)

## 2010-12-03 LAB — CBC
MCH: 32.8 pg (ref 26.0–34.0)
MCV: 93.3 fL (ref 78.0–100.0)
Platelets: 225 10*3/uL (ref 150–400)
RBC: 5.25 MIL/uL (ref 4.22–5.81)

## 2010-12-04 LAB — BASIC METABOLIC PANEL
BUN: 15 mg/dL (ref 6–23)
Chloride: 105 mEq/L (ref 96–112)
Glucose, Bld: 111 mg/dL — ABNORMAL HIGH (ref 70–99)
Potassium: 4 mEq/L (ref 3.5–5.1)

## 2010-12-04 LAB — CBC
HCT: 46.9 % (ref 39.0–52.0)
Hemoglobin: 16.1 g/dL (ref 13.0–17.0)
WBC: 11.2 10*3/uL — ABNORMAL HIGH (ref 4.0–10.5)

## 2010-12-04 LAB — CARDIAC PANEL(CRET KIN+CKTOT+MB+TROPI)
Relative Index: 8 — ABNORMAL HIGH (ref 0.0–2.5)
Troponin I: 24.86 ng/mL (ref <0.30)

## 2010-12-04 LAB — MRSA PCR SCREENING: MRSA by PCR: NEGATIVE

## 2010-12-06 ENCOUNTER — Other Ambulatory Visit: Payer: Self-pay | Admitting: Cardiology

## 2010-12-06 DIAGNOSIS — I2129 ST elevation (STEMI) myocardial infarction involving other sites: Secondary | ICD-10-CM

## 2010-12-07 ENCOUNTER — Telehealth: Payer: Self-pay | Admitting: Cardiology

## 2010-12-07 NOTE — Telephone Encounter (Signed)
Pt needs to get effient filled at [facility] in [place] needs authorization they have faxed [place] twice please call back  Is there any coupons for this med?   Also check on chantix  Pills he needs

## 2010-12-07 NOTE — Telephone Encounter (Signed)
I have not received any faxed requests for this pt and in fact am in the [place] office today. (the faxes go to the [place] office).  I will call the pharmacy to see what they need.  Called pharmacy.  They need a prior authorization for Effient.  They will fax the paperwork to this office

## 2010-12-07 NOTE — Telephone Encounter (Signed)
Called insurance company at [phone] he was approved for Effient.  Called pharmacy and they are aware.

## 2010-12-08 ENCOUNTER — Telehealth: Payer: Self-pay | Admitting: Cardiology

## 2010-12-13 NOTE — H&P (Signed)
NAMEBRODAN, Scott Hatfield NO.:  [id number]  MEDICAL RECORD NO.:  [id number]  LOCATION:  2304                         FACILITY:  [facility]  PHYSICIAN:  Rollene Rotunda, MD, FACCDATE OF BIRTH:  September 21, 1960  DATE OF ADMISSION:  12/03/2010 DATE OF DISCHARGE:                             HISTORY & PHYSICAL   PRIMARY PHYSICIAN:  Samuel Jester, MD  CARDIOLOGIST:  Learta Codding, MD,FACC  REASON FOR PRESENTATION:  Evaluate the patient with chest pain.  HISTORY OF PRESENT ILLNESS:  The patient is a pleasant 50 year old gentleman with a past history of stenting to his circumflex in 2008 in [place].  He had been doing well up until about 8 p.m. this evening. He developed severe chest discomfort in his left chest with radiation down into his left arm.  This was persistent.  He was slightly short of breath.  He had a mild clamminess.  EMS was called.  There was inferior ST depression with some mild elevation in lead AVR.  The patient was treated with aspirin and sublingual nitroglycerin with some improvement. His pain was 4/10 by the time he presented to the emergency room at [facility].  Prior to this, he had not been having any shortness of breath, PND, or orthopnea.  He has not been complaining of any palpitations, presyncope or syncope.  This pain maybe similar to his previous angina, but is worst.  PAST MEDICAL HISTORY:  Hypertension, tobacco abuse, gastroesophageal reflux disease, dyslipidemia, obesity, coronary artery disease (stent to the circumflex in 2008), tendon surgery on his left wrist about 2 weeks ago.  ALLERGIES:  None.  MEDICATIONS: 1. Metoprolol 25 mg daily. 2. Zocor 80 mg daily. 3. Aspirin 81 mg daily. 4. Fish oil. 5. Amlodipine 5 mg daily. 6. Lisinopril/HCT 20/25.  SOCIAL HISTORY:  The patient works in [employer].  He is married.  He smokes 1 pack of cigarettes per day currently.  FAMILY HISTORY:  Contributory for his mother having  coronary artery disease in her 44s.  REVIEW OF SYSTEMS:  As stated in the HPI, negative for all other systems.  PHYSICAL EXAMINATION:  GENERAL:  The patient is in mild distress secondary to discomfort. VITAL SIGNS:  Blood pressure 151/90, heart rate 102 and 98% saturation on 2 L. HEENT:  Eyes are unremarkable.  Pupils are equal, round and reactive to light.  Fundi not visualized.  Oral mucosa unremarkable. NECK:  No jugular venous distention at 45 degrees.  Carotid upstroke brisk and symmetrical.  No bruits.  No thyromegaly. LYMPHATICS:  No cervical, axillary, inguinal adenopathy. LUNGS:  Clear to auscultation bilaterally. BACK:  No costovertebral angle tenderness. CHEST:  Unremarkable. HEART:  PMI not displaced or sustained, S1 and S2 within normal limits. No S3, no S4.  No clicks, no rubs, no murmurs. ABDOMEN:  Obese, positive bowel sounds.  Normal in frequency and pitch. No bruits, no rebound, no guarding, no midline pulsatile mass.  No hepatomegaly.  No splenomegaly. SKIN:  No rashes, no nodules. EXTREMITIES:  Pulses 2+ throughout.  No edema, no cyanosis, no clubbing. NEUROLOGIC:  Oriented to person, place and time.  Cranial nerves II through XII grossly intact, motor grossly intact.  DIAGNOSTIC STUDIES:  EKG sinus tachycardia, rate 102, axis within normal limits, intervals within normal limits, inferior ST depression about 3- mm, 2-3 aVF with 1-mm ST-elevation in lead AVL, poor anterior R-wave progression.  LABORATORY DATA:  Pending.  ASSESSMENT AND PLAN: 1. Acute coronary syndrome.  The patient has acute coronary syndrome,     possible circ involvement.  He is going recently to the cardiac     cath lab.  He will be started on heparin.  We will try and get a     nitroglycerin drip started.  Further treatment will be per Dr.     Kirke Corin. 2. Hypertension.  He will continue the meds as listed with adjustments     as indicated. 3. Dyslipidemia.  We will check a lipid profile  and treat accordingly. 4. Tobacco, probably most important will be cessation and we will     educate him.     Rollene Rotunda, MD, [facility]     JH/MEDQ  D:  12/03/2010  T:  12/04/2010  Job:  [id number]  cc:   Samuel Jester, MD  Electronically Signed by Rollene Rotunda MD [facility] on 12/13/2010 05:46:41 PM

## 2010-12-13 NOTE — Cardiovascular Report (Signed)
NAMEISSAIH, KAUS NO.:  [id number]  MEDICAL RECORD NO.:  [id number]  LOCATION:  2304                         FACILITY:  [facility]  PHYSICIAN:  Lorine Bears, MD     DATE OF BIRTH:  06-25-1960  DATE OF PROCEDURE:  12/03/2010 DATE OF DISCHARGE:                           CARDIAC CATHETERIZATION   PRIMARY CARDIOLOGIST:  Learta Codding, MD, [facility].  PROCEDURES PERFORMED: 1. Left heart catheterization. 2. Coronary angiography. 3. Left ventricular angiography. 4. Second diagonal angioplasty and drug-eluting stent placement.  ACCESS:  Right radial artery.  INDICATION AND CLINICAL HISTORY:  This is a 50 year old gentleman with known history of coronary artery disease, status post angioplasty and stent placement to the left circumflex 3 years ago.  The patient presented with acute symptoms of chest pain.  ECG showed some lateral ST elevation in lead 1 and aVL with ST depression in the inferior leads. The code STEMI was called and the patient was transferred for emergent cardiac catheterization.  STUDY DETAILS:  The right radial area was prepped in a sterile fashion. It was anesthetized with 1% lidocaine.  A 6-French sheath was placed in the right radial artery after an anterior puncture.  A 3 mg of verapamil was given through the sheath.  The patient was already received heparin before arrival to the Catheterization Lab.  IV Angiomax was initiated with therapeutic ACT.  He was given 60 mg of Effient orally.  Coronary angiography was performed with an Ikari 3.5 left, which engaged the left main coronary artery.  Angioplasty was performed through the guiding catheter.  Right coronary angiography was performed after the angioplasty with a JR4 diagnostic catheter.  Left ventricular angiography was performed with a pigtail catheter.  INTERVENTIONAL PROCEDURE NOTE:  The lesion in the 2nd diagonal was crossed with a [place] wire without much difficulty.  It was  dilated with a 2.5 x 15 mm balloon to 6 atmospheres x3 inflations.  I then placed a 2.5 x 20 mm PROMUS element stent and deployed it at 12 atmospheres.  This was post dilated with an [place] Quantum balloon 2.75 x 15 mm to 12 atmospheres x2 inflations.  Angiography showed good results. However, the ostium of the diagonal seemed to have residual 30-40% stenosis.  The location and the angulation with the LAD does not really allow ostial stent placement.  Thus, I decided to do a balloon angioplasty on the ostium with the same 2.75 balloon, which was inflated to 6 atmospheres.  The plaque rupture in the diagonal was did not really involve the ostium.  The location is where there is a moderate LAD stenosis.  STUDY FINDINGS:  Hemodynamic findings: Left ventricular pressure is 120/11 with a left ventricular end- diastolic pressure of 21 mmHg.  Aortic pressure is 122/83 with a mean pressure of 102 mmHg.  Left ventricular angiography:  This showed normal LV systolic function with an estimated ejection fraction of 55%.  Coronary angiography: 1. Left main coronary artery:  The vessel is normal in size without     any significant disease. 2. Left circumflex artery: The vessel is normal in size and     nondominant.  There is a 30% tubular stenosis in  the mid segment     after a small OM 1.  The rest of the left circumflex does not have     significant disease.  There is a stent noted in the distal segment     extending into OM 3, which does not have any significant     restenosis.  OM 2 and OM 3 are overall small in size. 3. Left anterior descending artery: The vessel is normal in size.  In     the mid segment, there is a diffuse 40-50% stenosis at the origin     of the occluded second diagonal.  The rest of the LAD is free of     significant disease.  The first diagonal is a large-sized branch     and gives 2 medium-sized branches.  It is free of significant     disease.  Second diagonal is normal  in size and appears to be     occluded right after the ostium with plaque rupture. 4. Right coronary artery: The vessel is normal in size and dominant.     It has minor luminal irregularities, but no evidence of obstructive     disease.  STUDY CONCLUSIONS: 1. Acute lateral ST-elevation myocardial infarction due to occlusion     of the second diagonal branch of the LAD. 2. Patent stent in the left circumflex. 3. Moderate disease in the mid left anterior descending artery. 4. Mildly elevated left ventricular end-diastolic pressure and normal     LV systolic function. 5. Successful angioplasty and drug-eluting stent placement to the     second diagonal with a 2.5 x 20 mm PROMUS Element drug-eluting     stent.  There was residual disease at the ostium, which was treated     with balloon angioplasty alone without stent placement.  The     location is not ideal for placing another stent due to the ostial     location and angulation with a left anterior descending artery.  RECOMMENDATIONS:  Recommend dual-antiplatelet therapy for at least 1 year.  Continue aggressive treatment of risk factors and smoking cessation.     Lorine Bears, MD     MA/MEDQ  D:  12/03/2010  T:  12/04/2010  Job:  [id number]  cc:   Learta Codding, MD,FACC  Electronically Signed by Lorine Bears MD on 12/13/2010 91:47:82 PM

## 2010-12-16 ENCOUNTER — Encounter: Payer: Self-pay | Admitting: Cardiology

## 2010-12-16 NOTE — Discharge Summary (Signed)
  NAMELAURIER, JASPERSON NO.:  [id number]  MEDICAL RECORD NO.:  [id number]  LOCATION:  3729                         FACILITY:  [facility]  PHYSICIAN:  Jesse Sans. Ewan Grau, MD, FACCDATE OF BIRTH:  [birth year]  DATE OF ADMISSION:  12/03/2010 DATE OF DISCHARGE:  12/06/2010                              DISCHARGE SUMMARY   ADDENDUM:  Mr. Chiappetta requested a prescription for Chantix to help him come off tobacco and a prescription for a Chantix starter kit was given to him.  All other discharge instructions and medications are unchanged.     Theodore Demark, PA-C   ______________________________ Jesse Sans Daleen Squibb, MD, [facility]    RB/MEDQ  D:  12/06/2010  T:  12/06/2010  Job:  [id number]  Electronically Signed by Theodore Demark PA-C on 12/15/2010 04:00:57 PM Electronically Signed by Valera Castle MD [facility] on 12/16/2010 01:39:02 PM

## 2010-12-16 NOTE — Discharge Summary (Signed)
NAMEADARIAN, BUR NO.:  [id number]  MEDICAL RECORD NO.:  [id number]  LOCATION:  3729                         FACILITY:  [facility]  PHYSICIAN:  Jesse Sans. Andrika Peraza, MD, FACCDATE OF BIRTH:  26-Jan-1961  DATE OF ADMISSION:  12/03/2010 DATE OF DISCHARGE:  12/06/2010                              DISCHARGE SUMMARY   PROCEDURES: 1. Cardiac catheterization. 2. Coronary arteriogram. 3. Left ventriculogram. 4. Percutaneous transluminal coronary angioplasty and 2.5 x 20 mm     Promus drug-eluting stent to Scott second diagonal.  FINAL DISCHARGE DIAGNOSIS:  Lateral ST-elevation myocardial infarction.  SECONDARY DIAGNOSES: 1. History of stent to Scott circumflex in 2008 in [place]. 2. Hypertension. 3. Ongoing tobacco use. 4. Gastroesophageal reflux disease. 5. History of dyslipidemia. 6. Obesity. 7. Status post left wrist tendon surgery. 8. Family history of coronary artery disease in his mother.  TIME AT DISCHARGE:  32 minutes.  HOSPITAL COURSE:  Scott Hatfield is a 50 year old male with a history of coronary artery disease.  He had chest pain and came [facility] where his EKG was abnormal and he was taken to Scott cath lab.  Scott cardiac catheterization showed Scott second diagonal totalled which was treated with PTCA and drug-eluting stent reducing Scott stenosis to zero.  Residual coronary artery disease of 50% in Scott LAD and 30% in Scott circumflex (circumflex stent patent) is to be treated medically.  EF of 55%.  Scott Hatfield was counseled on smoking cessation and seen by Cardiac Rehab. He is to follow up with Phase 2.  He is on Chantix for smoking cessation.  He was encouraged to increase his exercise tolerance per Cardiac Rehab guidelines and lose some weight.  On December 06, 2010, Scott Hatfield had progressed and was ambulating without chest pain or shortness of breath.  He was evaluated by Dr. Daleen Squibb and considered stable for discharge in improved condition, to follow up  as an outpatient.  BRIEF DISCHARGE INSTRUCTIONS:  His activity level is to be increased gradually with no driving for 4 days, no sexual activity for a week, and no lifting for 3 weeks.  He is encouraged to stick to a low-sodium, heart healthy diet.  He is not to use tobacco.  He is to call our office for problems with Scott cath site.  He wishes to do Phase 2 Cardiac Rehab in [name].  He will follow up with Dr. Antoine Poche and an appointment will be arranged.  He is to follow up with Dr. Charm Barges as needed.  DISCHARGE MEDICATIONS: 1. Lisinopril and hydrochlorothiazide is on hold. 2. Lopressor 50 mg b.i.d. 3. Toprol-XL 25 mg is discontinued. 4. Amlodipine 5 mg is discontinued. 5. Artificial Tears t.i.d. p.r.n. 6. Zocor 80 mg a day. 7. Sublingual nitroglycerin p.r.n. 8. Vicodin q.6 hours p.r.n. 9. Aspirin 81 mg a day. 10.Effient 10 mg a day.     Theodore Demark, PA-C   ______________________________ Jesse Sans Daleen Squibb, MD, [facility]    RB/MEDQ  D:  12/06/2010  T:  12/06/2010  Job:  [id number]  cc:   Samuel Jester, MD Electronically Signed by Theodore Demark PA-C on 12/15/2010 04:00:48 PM Electronically Signed by Valera Castle MD [facility] on 12/16/2010 01:38:58 PM

## 2010-12-21 ENCOUNTER — Ambulatory Visit (INDEPENDENT_AMBULATORY_CARE_PROVIDER_SITE_OTHER): Payer: Managed Care, Other (non HMO) | Admitting: Cardiology

## 2010-12-21 ENCOUNTER — Encounter: Payer: Self-pay | Admitting: Cardiology

## 2010-12-21 VITALS — BP 158/104 | HR 94 | Resp 16 | Ht 72.0 in | Wt 267.0 lb

## 2010-12-21 DIAGNOSIS — E663 Overweight: Secondary | ICD-10-CM

## 2010-12-21 DIAGNOSIS — I2510 Atherosclerotic heart disease of native coronary artery without angina pectoris: Secondary | ICD-10-CM

## 2010-12-21 DIAGNOSIS — I10 Essential (primary) hypertension: Secondary | ICD-10-CM

## 2010-12-21 DIAGNOSIS — Z720 Tobacco use: Secondary | ICD-10-CM

## 2010-12-21 DIAGNOSIS — E785 Hyperlipidemia, unspecified: Secondary | ICD-10-CM

## 2010-12-21 DIAGNOSIS — F17200 Nicotine dependence, unspecified, uncomplicated: Secondary | ICD-10-CM

## 2010-12-21 MED ORDER — LISINOPRIL 10 MG PO TABS: 10.0000 mg | ORAL_TABLET | Freq: Every day | ORAL | Status: DC

## 2010-12-21 NOTE — Assessment & Plan Note (Signed)
We discussed weight loss with diet and exercise and I will revisit this at future appointments.

## 2010-12-21 NOTE — Assessment & Plan Note (Signed)
We discussed at length the need for aggressive risk reduction given this his second MI by the age of 14. Hopefully he can comply.

## 2010-12-21 NOTE — Assessment & Plan Note (Signed)
I reviewed the hospital records but did not see a lipid profile. He will have one fasting. The goal will be an LDL less than 70 and HDL greater than 50.

## 2010-12-21 NOTE — Assessment & Plan Note (Signed)
I told him that this is his most significant modifiable risk factor at this point. I gave him a prescription for Chantix. I gave him a prescription for nicotine patches. He will use whichever one his insurance covers. He knows not to use both.   (Greater than 40 minutes reviewing all data with greater than 50% face to face with the patient).

## 2010-12-21 NOTE — Progress Notes (Signed)
HPI Patient presents for evaluation of coronary artery disease. He had a history of circumflex stenting in another town in 2008. He presented urgently recently with unstable angina and ST segment elevation and was found to have an occluded diagonal. He had a drug-eluting stent placed. He had 50% LAD stenosis and 30% circumflex in-stent stenosis that was managed medically. Since going home he has had none of the discomfort that was his previous angina. He has had some soreness with deep breathing.  He has tried to stop smoking but he is finding it difficult and still smokes a few cigarettes. He's not having any new shortness of breath, PND or orthopnea. He's not having any new palpitations, presyncope or syncope. He has had no new weight gain or edema.  No Known Allergies  Current Outpatient Prescriptions  Medication Sig Dispense Refill  . aspirin 81 MG tablet Take 81 mg by mouth daily.        . metoprolol (LOPRESSOR) 50 MG tablet Take 50 mg by mouth 2 (two) times daily.        . nitroGLYCERIN (NITROSTAT) 0.4 MG SL tablet Place 0.4 mg under the tongue every 5 (five) minutes as needed. May repeat for up to 3 doses.       . prasugrel (EFFIENT) 10 MG TABS Take 10 mg by mouth daily.        . simvastatin (ZOCOR) 80 MG tablet TAKE ONE TABLET BY MOUTH AT BEDTIME  30 tablet  1    Past Medical History  Diagnosis Date  . Overweight   . Hypertension   . Hyperlipidemia, mixed   . CAD in native artery     Single-vessel CAD with stent placement to the circumflex coronary artery in 2008, neg stress Cardiolite study  . GERD (gastroesophageal reflux disease)     Past Surgical History  Procedure Date  . Coronary stent placement 2008    ROS:  As stated in the HPI and negative for all other systems.   PHYSICAL EXAM BP 158/104  Pulse 94  Resp 16  Ht 6' (1.829 m)  Wt 267 lb (121.11 kg)  BMI 36.21 kg/m2 GENERAL:  Well appearing HEENT:  Pupils equal round and reactive, fundi not visualized, oral  mucosa unremarkable NECK:  No jugular venous distention, waveform within normal limits, carotid upstroke brisk and symmetric, no bruits, no thyromegaly LYMPHATICS:  No cervical, inguinal adenopathy LUNGS:  Clear to auscultation bilaterally BACK:  No CVA tenderness CHEST:  Unremarkable HEART:  PMI not displaced or sustained,S1 and S2 within normal limits, no S3, no S4, no clicks, no rubs, no murmurs ABD:  Flat, positive bowel sounds normal in frequency in pitch, no bruits, no rebound, no guarding, no midline pulsatile mass, no hepatomegaly, no splenomegaly EXT:  2 plus pulses throughout, no edema, no cyanosis no clubbing SKIN:  No rashes no nodules NEURO:  Cranial nerves II through XII grossly intact, motor grossly intact throughout PSYCH:  Cognitively intact, oriented to person place and time   ASSESSMENT AND PLAN

## 2010-12-21 NOTE — Assessment & Plan Note (Signed)
I will restart lisinopril 10 mg. In 2 weeks he will get a basic metabolic profile.

## 2010-12-21 NOTE — Patient Instructions (Addendum)
Please start Lisinopril 10 mg. Continue all other medications as listed. Please have fasting lipid profile and BMP in 2 weeks. Follow up with Dr Antoine Poche in 3 months

## 2011-01-10 ENCOUNTER — Encounter: Payer: Self-pay | Admitting: Cardiology

## 2011-01-27 ENCOUNTER — Other Ambulatory Visit: Payer: Self-pay | Admitting: *Deleted

## 2011-01-27 MED ORDER — ATORVASTATIN CALCIUM 80 MG PO TABS: 80.0000 mg | ORAL_TABLET | Freq: Every day | ORAL | Status: DC

## 2011-03-22 ENCOUNTER — Ambulatory Visit: Payer: Managed Care, Other (non HMO) | Admitting: Cardiology

## 2011-04-07 ENCOUNTER — Encounter: Payer: Self-pay | Admitting: Cardiology

## 2011-04-12 ENCOUNTER — Ambulatory Visit (INDEPENDENT_AMBULATORY_CARE_PROVIDER_SITE_OTHER): Payer: Managed Care, Other (non HMO) | Admitting: Cardiology

## 2011-04-12 ENCOUNTER — Encounter: Payer: Self-pay | Admitting: Cardiology

## 2011-04-12 DIAGNOSIS — F17200 Nicotine dependence, unspecified, uncomplicated: Secondary | ICD-10-CM

## 2011-04-12 DIAGNOSIS — E785 Hyperlipidemia, unspecified: Secondary | ICD-10-CM

## 2011-04-12 DIAGNOSIS — I10 Essential (primary) hypertension: Secondary | ICD-10-CM

## 2011-04-12 DIAGNOSIS — E663 Overweight: Secondary | ICD-10-CM

## 2011-04-12 DIAGNOSIS — I2510 Atherosclerotic heart disease of native coronary artery without angina pectoris: Secondary | ICD-10-CM

## 2011-04-12 NOTE — Assessment & Plan Note (Signed)
His lipids were drawn a few days ago with a total 142, LDL 84 and HDL 38. He will remain on the meds as listed participated risk reduction lifestyle changes. He is near target goal.

## 2011-04-12 NOTE — Patient Instructions (Signed)
The current medical regimen is effective;  continue present plan and medications.  Follow up in 6 months with Dr Hochrein.  You will receive a letter in the mail 2 months before you are due.  Please call us when you receive this letter to schedule your follow up appointment.  

## 2011-04-12 NOTE — Assessment & Plan Note (Signed)
The patient has no new sypmtoms.  No further cardiovascular testing is indicated.  We will continue with aggressive risk reduction and meds as listed.  

## 2011-04-12 NOTE — Assessment & Plan Note (Signed)
The patient understands the need to lose weight with diet and exercise. We have discussed specific strategies for this.  

## 2011-04-12 NOTE — Assessment & Plan Note (Signed)
His blood pressure is upper limits of normal here but he reports well controlled at home. I will make no change to his regimen.

## 2011-04-12 NOTE — Progress Notes (Signed)
   HPI Patient presents for evaluation of coronary artery disease. He had a history of circumflex stenting in another town in 2008. He presented urgently recently with unstable angina and ST segment elevation and was found to have an occluded diagonal. He had a drug-eluting stent placed. He had 50% LAD stenosis and 30% circumflex in-stent stenosis that was managed medically.   At the last appointment we had a long discussion about secondary risk reduction. We discussed tobacco cessation. I added a low dose of ACE inhibitor for blood pressure control. Since I last saw him he has continued to have some very mild dyspnea but he's not having any other cardiovascular symptoms. The patient denies any new symptoms such as chest discomfort, neck or arm discomfort. There has been no new PND or orthopnea. There have been no reported palpitations, presyncope or syncope.  He unfortunately is not participating in secondary risk reduction.  He is still smoking a few cigarettes and is not exercising.  No Known Allergies  Current Outpatient Prescriptions  Medication Sig Dispense Refill  . aspirin 81 MG tablet Take 81 mg by mouth daily.        Marland Kitchen atorvastatin (LIPITOR) 80 MG tablet Take 1 tablet (80 mg total) by mouth daily.  30 tablet  11  . lisinopril (PRINIVIL,ZESTRIL) 20 MG tablet Take 20 mg by mouth daily.      . metoprolol (LOPRESSOR) 50 MG tablet Take 50 mg by mouth 2 (two) times daily.        . nitroGLYCERIN (NITROSTAT) 0.4 MG SL tablet Place 0.4 mg under the tongue every 5 (five) minutes as needed. May repeat for up to 3 doses.       . prasugrel (EFFIENT) 10 MG TABS Take 10 mg by mouth daily.          Past Medical History  Diagnosis Date  . Overweight   . Hypertension   . Hyperlipidemia, mixed   . CAD in native artery     Single-vessel CAD with stent placement to the circumflex coronary artery in 2008,  DES to Diag 10/12  . GERD (gastroesophageal reflux disease)   . Tobacco abuse     Past  Surgical History  Procedure Date  . Coronary stent placement 2008, 2012   ROS:  As stated in the HPI and negative for all other systems.  PHYSICAL EXAM BP 140/92  Pulse 86  Ht 6' (1.829 m)  Wt 270 lb (122.471 kg)  BMI 36.62 kg/m2 GENERAL:  Well appearing HEENT:  Pupils equal round and reactive, fundi not visualized, oral mucosa unremarkable NECK:  No jugular venous distention, waveform within normal limits, carotid upstroke brisk and symmetric, no bruits, no thyromegaly LYMPHATICS:  No cervical, inguinal adenopathy LUNGS:  Clear to auscultation bilaterally BACK:  No CVA tenderness CHEST:  Unremarkable HEART:  PMI not displaced or sustained,S1 and S2 within normal limits, no S3, no S4, no clicks, no rubs, no murmurs ABD:  Flat, positive bowel sounds normal in frequency in pitch, no bruits, no rebound, no guarding, no midline pulsatile mass, no hepatomegaly, no splenomegaly, obese EXT:  2 plus pulses throughout, no edema, no cyanosis no clubbing SKIN:  No rashes no nodules NEURO:  Cranial nerves II through XII grossly intact, motor grossly intact throughout [facility]:  Cognitively intact, oriented to person place and time  EKG:  Sinus rhythm, rate 87, axis within normal limits, intervals within normal limits, nonspecific T-wave flattening 04/12/2011   ASSESSMENT AND PLAN

## 2011-04-12 NOTE — Assessment & Plan Note (Signed)
We discussed a specific strategy for tobacco cessation.  (Greater than three minutes discussing tobacco cessation.)  

## 2011-06-07 NOTE — Telephone Encounter (Signed)
Close  

## 2011-11-22 ENCOUNTER — Encounter: Payer: Self-pay | Admitting: Cardiology

## 2011-11-22 ENCOUNTER — Ambulatory Visit (INDEPENDENT_AMBULATORY_CARE_PROVIDER_SITE_OTHER): Payer: BC Managed Care – PPO | Admitting: Cardiology

## 2011-11-22 VITALS — BP 160/120 | HR 82 | Ht 72.0 in | Wt 270.0 lb

## 2011-11-22 DIAGNOSIS — R0989 Other specified symptoms and signs involving the circulatory and respiratory systems: Secondary | ICD-10-CM

## 2011-11-22 DIAGNOSIS — I10 Essential (primary) hypertension: Secondary | ICD-10-CM

## 2011-11-22 DIAGNOSIS — F17200 Nicotine dependence, unspecified, uncomplicated: Secondary | ICD-10-CM

## 2011-11-22 DIAGNOSIS — R0683 Snoring: Secondary | ICD-10-CM

## 2011-11-22 DIAGNOSIS — E785 Hyperlipidemia, unspecified: Secondary | ICD-10-CM

## 2011-11-22 DIAGNOSIS — I2510 Atherosclerotic heart disease of native coronary artery without angina pectoris: Secondary | ICD-10-CM

## 2011-11-22 DIAGNOSIS — R0609 Other forms of dyspnea: Secondary | ICD-10-CM

## 2011-11-22 DIAGNOSIS — E663 Overweight: Secondary | ICD-10-CM

## 2011-11-22 MED ORDER — VARENICLINE TARTRATE 0.5 MG X 11 & 1 MG X 42 PO MISC: ORAL | Status: DC

## 2011-11-22 MED ORDER — LISINOPRIL 40 MG PO TABS: 40.0000 mg | ORAL_TABLET | Freq: Every day | ORAL | Status: DC

## 2011-11-22 MED ORDER — VARENICLINE TARTRATE 1 MG PO TABS: 1.0000 mg | ORAL_TABLET | Freq: Two times a day (BID) | ORAL | Status: DC

## 2011-11-22 NOTE — Progress Notes (Signed)
HPI Patient presents for evaluation of coronary artery disease. He had a history of circumflex stenting in another town in 2008. He presented urgently in 10/12 with unstable angina and ST segment elevation and was found to have an occluded diagonal. He had a drug-eluting stent placed. He had 50% LAD stenosis and 30% circumflex in-stent stenosis that was managed medically.   Since I last saw him the patient has had no new symptoms consistent with his previous angina. He's had some fleeting chest discomfort. He unfortunately has not been able to quit smoking though he does want to. He's gained some weight. He denies any new shortness of breath, PND or orthopnea. He has had no new palpitations, presyncope or syncope. He has had some headaches and thinks his blood pressure has been elevated but he also might have some sinus trouble.  No Known Allergies  Current Outpatient Prescriptions  Medication Sig Dispense Refill  . aspirin 81 MG tablet Take 81 mg by mouth daily.        Marland Kitchen lisinopril (PRINIVIL,ZESTRIL) 20 MG tablet Take 20 mg by mouth daily.      . metoprolol (LOPRESSOR) 50 MG tablet Take 50 mg by mouth 2 (two) times daily.        . nitroGLYCERIN (NITROSTAT) 0.4 MG SL tablet Place 0.4 mg under the tongue every 5 (five) minutes as needed. May repeat for up to 3 doses.       . prasugrel (EFFIENT) 10 MG TABS Take 10 mg by mouth daily.        . simvastatin (ZOCOR) 80 MG tablet Take 80 mg by mouth at bedtime.        Past Medical History  Diagnosis Date  . Overweight   . Hypertension   . Hyperlipidemia, mixed   . CAD in native artery     Single-vessel CAD with stent placement to the circumflex coronary artery in 2008,  DES to Diag 10/12  . GERD (gastroesophageal reflux disease)   . Tobacco abuse     Past Surgical History  Procedure Date  . Coronary stent placement 2008, 2012   ROS: Positive for snoring, headaches, daytime somnolence.  Otherwise as stated in the HPI and negative for all  other systems.  PHYSICAL EXAM BP 160/120  Pulse 82  Ht 6' (1.829 m)  Wt 122.471 kg (270 lb)  BMI 36.62 kg/m2 GENERAL:  Well appearing HEENT:  Pupils equal round and reactive, fundi not visualized, oral mucosa unremarkable NECK:  No jugular venous distention, waveform within normal limits, carotid upstroke brisk and symmetric, no bruits, no thyromegaly LYMPHATICS:  No cervical, inguinal adenopathy LUNGS:  Clear to auscultation bilaterally BACK:  No CVA tenderness CHEST:  Unremarkable HEART:  PMI not displaced or sustained,S1 and S2 within normal limits, no S3, no S4, no clicks, no rubs, no murmurs ABD:  Flat, positive bowel sounds normal in frequency in pitch, no bruits, no rebound, no guarding, no midline pulsatile mass, no hepatomegaly, no splenomegaly, obese EXT:  2 plus pulses throughout, no edema, no cyanosis no clubbing   EKG:  Sinus rhythm, rate 82, axis within normal limits, intervals within normal limits, nonspecific T-wave flattening.  No change from previous.  11/22/2011   ASSESSMENT AND PLAN   CAD, NATIVE VESSEL -  The patient has no new sypmtoms. No further cardiovascular testing is indicated. We will continue with aggressive risk reduction and meds as listed. I will likely stop his Effient at the next visit. However, given the significant ongoing  risk factors I will continue it for now.  HYPERTENSION, UNSPECIFIED -  His blood pressure is not at target and I will increase his lisinopril to 40 mg daily.  HYPERLIPIDEMIA-MIXED -  His lipids most recently were total 142, LDL 84 and HDL 38. He will remain on the meds as listed participated risk reduction lifestyle changes. He is near target goal.  TOBACCO ABUSE -  We discussed a specific strategy for tobacco cessation. He does want to try Chantix.  We discussed all of the potential side effects and in particular I reviewed the [employer].  He has no depression.  He understands and wishes to try this  medication.  OVERWEIGHT/OBESITY -  He has gained about 30 pounds in the last year or so. We will continue discussed with specific suggestions and plans.  SLEEP APNEA -  He has symptoms consistent with sleep apnea and he will have a sleep study.

## 2011-11-22 NOTE — Patient Instructions (Addendum)
Increase Lisinopril to 40 mg a day Start Chantix as instructed Continue all other medications as listed  Your physician has recommended that you have a sleep study. This test records several body functions during sleep, including: brain activity, eye movement, oxygen and carbon dioxide blood levels, heart rate and rhythm, breathing rate and rhythm, the flow of air through your mouth and nose, snoring, body muscle movements, and chest and belly movement.  Follow up with Dr Antoine Poche in 3 months.

## 2011-12-08 ENCOUNTER — Other Ambulatory Visit: Payer: Self-pay | Admitting: Physician Assistant

## 2012-02-09 ENCOUNTER — Other Ambulatory Visit: Payer: Self-pay | Admitting: Cardiology

## 2012-02-21 ENCOUNTER — Encounter: Payer: Self-pay | Admitting: Cardiology

## 2012-02-21 ENCOUNTER — Ambulatory Visit (INDEPENDENT_AMBULATORY_CARE_PROVIDER_SITE_OTHER): Payer: BC Managed Care – PPO | Admitting: Cardiology

## 2012-02-21 VITALS — BP 168/110 | HR 86 | Ht 72.0 in | Wt 269.0 lb

## 2012-02-21 DIAGNOSIS — R0683 Snoring: Secondary | ICD-10-CM

## 2012-02-21 DIAGNOSIS — I10 Essential (primary) hypertension: Secondary | ICD-10-CM

## 2012-02-21 DIAGNOSIS — R0609 Other forms of dyspnea: Secondary | ICD-10-CM

## 2012-02-21 DIAGNOSIS — E663 Overweight: Secondary | ICD-10-CM

## 2012-02-21 DIAGNOSIS — E785 Hyperlipidemia, unspecified: Secondary | ICD-10-CM

## 2012-02-21 DIAGNOSIS — F17200 Nicotine dependence, unspecified, uncomplicated: Secondary | ICD-10-CM

## 2012-02-21 DIAGNOSIS — I2510 Atherosclerotic heart disease of native coronary artery without angina pectoris: Secondary | ICD-10-CM

## 2012-02-21 MED ORDER — VARENICLINE TARTRATE 1 MG PO TABS: 1.0000 mg | ORAL_TABLET | Freq: Two times a day (BID) | ORAL | Status: DC

## 2012-02-21 MED ORDER — VARENICLINE TARTRATE 0.5 MG X 11 & 1 MG X 42 PO MISC: ORAL | Status: DC

## 2012-02-21 MED ORDER — ATORVASTATIN CALCIUM 40 MG PO TABS: 40.0000 mg | ORAL_TABLET | Freq: Every day | ORAL | Status: DC

## 2012-02-21 MED ORDER — AMLODIPINE BESYLATE 5 MG PO TABS: 5.0000 mg | ORAL_TABLET | Freq: Every day | ORAL | Status: DC

## 2012-02-21 NOTE — Patient Instructions (Addendum)
Please stop Effient Stop Zocor (simvastatin)  Start Lipitor 40 mg a day Start Norvasc 5 mg a day  Take Chantix as directed  Continue all other medications as listed  Your physician has recommended that you have a sleep study. This test records several body functions during sleep, including: brain activity, eye movement, oxygen and carbon dioxide blood levels, heart rate and rhythm, breathing rate and rhythm, the flow of air through your mouth and nose, snoring, body muscle movements, and chest and belly movement. You will be contacted with at appointment for this.  Follow up with Dr Antoine Poche in 2 months.

## 2012-02-21 NOTE — Progress Notes (Signed)
HPI Patient presents for evaluation of coronary artery disease. He had a history of circumflex stenting in another town in 2008. He presented urgently in 10/12 with unstable angina and ST segment elevation and was found to have an occluded diagonal. He had a drug-eluting stent placed. He had 50% LAD stenosis and 30% circumflex in-stent stenosis that was managed medically.   Since I last saw him the patient has had no new symptoms consistent with his previous angina. Unfortunately he's really not compliant with any of his lifestyle changes that I suggested. He is unable to stop smoking. He is  eating poorly.  He doesn't exercise. Unfortunately his job as a [employer] does make all of this more difficult. The patient denies any new symptoms such as chest discomfort, neck or arm discomfort. There has been no new shortness of breath, PND or orthopnea. There have been no reported palpitations, presyncope or syncope.  No Known Allergies  Current Outpatient Prescriptions  Medication Sig Dispense Refill  . aspirin 81 MG tablet Take 81 mg by mouth daily.        Marland Kitchen atorvastatin (LIPITOR) 80 MG tablet TAKE ONE TABLET BY MOUTH EVERY DAY  30 tablet  10  . EFFIENT 10 MG TABS TAKE ONE TABLET BY MOUTH EVERY DAY  30 each  10  . lisinopril (PRINIVIL,ZESTRIL) 40 MG tablet Take 1 tablet (40 mg total) by mouth daily.  30 tablet  11  . metoprolol (LOPRESSOR) 50 MG tablet Take 50 mg by mouth 2 (two) times daily.        . nitroGLYCERIN (NITROSTAT) 0.4 MG SL tablet Place 0.4 mg under the tongue every 5 (five) minutes as needed. May repeat for up to 3 doses.       . simvastatin (ZOCOR) 80 MG tablet Take 80 mg by mouth at bedtime.      . varenicline (CHANTIX) 1 MG tablet Take 1 tablet (1 mg total) by mouth 2 (two) times daily.  60 tablet  3    Past Medical History  Diagnosis Date  . Overweight   . Hypertension   . Hyperlipidemia, mixed   . CAD in native artery     Single-vessel CAD with stent placement to the  circumflex coronary artery in 2008,  DES to Diag 10/12  . GERD (gastroesophageal reflux disease)   . Tobacco abuse     Past Surgical History  Procedure Date  . Coronary stent placement 2008, 2012   ROS: Positive for snoring, headaches, daytime somnolence.  Otherwise as stated in the HPI and negative for all other systems.  PHYSICAL EXAM BP 168/110  Pulse 86  Ht 6' (1.829 m)  Wt 269 lb (122.018 kg)  BMI 36.48 kg/m2  SpO2 94% GENERAL:  Well appearing HEENT:  Pupils equal round and reactive, fundi not visualized, oral mucosa unremarkable NECK:  No jugular venous distention, waveform within normal limits, carotid upstroke brisk and symmetric, no bruits, no thyromegaly LYMPHATICS:  No cervical, inguinal adenopathy LUNGS:  Clear to auscultation bilaterally BACK:  No CVA tenderness CHEST:  Unremarkable HEART:  PMI not displaced or sustained,S1 and S2 within normal limits, no S3, no S4, no clicks, no rubs, no murmurs ABD:  Flat, positive bowel sounds normal in frequency in pitch, no bruits, no rebound, no guarding, no midline pulsatile mass, no hepatomegaly, no splenomegaly, obese EXT:  2 plus pulses throughout, no edema, no cyanosis no clubbing   ASSESSMENT AND PLAN   CAD, NATIVE VESSEL -  The patient has  no new sypmtoms. No further cardiovascular testing is indicated. We will continue with aggressive risk reduction and meds as listed. Given the fact that he has now been greater than 12 months he can stop his Effient.   We had another long and very frank discussion about his risk of future cardiovascular events if he has not changed his lifestyle.   HYPERTENSION, UNSPECIFIED -  His blood pressure is not at target and I will add Norvasc 5 mg daily.  HYPERLIPIDEMIA-MIXED -  As on changing the Norvasc I will stop his Zocor and start Lipitor 40 mg daily.  TOBACCO ABUSE -  We had another long discussion about this. He didn't start the Chantix. He thinks he will try it now. He does want  to try Chantix.  We discussed all of the potential side effects and in particular I reviewed the [employer].  He has no depression.  He understands and wishes to try this medication.  OVERWEIGHT/OBESITY -  He lost 10 pounds when he had his teeth extracted. He gained 10 pounds over Christmas.  SLEEP APNEA -  He has symptoms consistent with sleep apnea but he canceled a recent sleep study. I will reschedule this.

## 2012-02-22 ENCOUNTER — Telehealth: Payer: Self-pay | Admitting: Cardiology

## 2012-02-22 NOTE — Telephone Encounter (Signed)
New problem:   Seen in the [facility] office  on yesterday. Clarification of medication  Atorvastatin 40 mg. Was taken 80 mg.

## 2012-02-22 NOTE — Telephone Encounter (Signed)
Tami, pts wife, is concerned b/c pt brought in the wrong medication list to his OV with Dr. Antoine Poche yesterday and there were adjustments made on his meds. She states that the pt was already taking Atorvastatin 80 mg (not Simvastatin as was listed on his med. List) and pt was advised to take Atorvastatin 40 mg. She wants to know what amount he should take. Lieutenant Diego is advised that pt. may take 40 mg until Dr. Antoine Poche gives recommendation, she verbalized understanding. Note forwarded to Dr. Antoine Poche.

## 2012-02-24 ENCOUNTER — Other Ambulatory Visit: Payer: Self-pay | Admitting: Physician Assistant

## 2012-02-25 NOTE — Telephone Encounter (Signed)
The patient should take the 80 of atorvastatin.

## 2012-02-28 NOTE — Telephone Encounter (Signed)
Pt is on 40 mg a day of Lipitor and will continue this.  Per wife- they will continue to monitor his BP at home as it continues to be elevated. Pt has not been contacted about scheduling sleep study. Staff message sent for pt to be called to be scheduled.

## 2012-03-04 ENCOUNTER — Other Ambulatory Visit: Payer: Self-pay

## 2012-03-15 ENCOUNTER — Ambulatory Visit: Payer: BC Managed Care – PPO | Attending: Cardiology | Admitting: Sleep Medicine

## 2012-03-15 DIAGNOSIS — Z6837 Body mass index (BMI) 37.0-37.9, adult: Secondary | ICD-10-CM | POA: Insufficient documentation

## 2012-03-15 DIAGNOSIS — G4733 Obstructive sleep apnea (adult) (pediatric): Secondary | ICD-10-CM | POA: Insufficient documentation

## 2012-03-15 DIAGNOSIS — R0683 Snoring: Secondary | ICD-10-CM

## 2012-03-24 NOTE — Procedures (Signed)
[facility] Sasha Rogel [name], MD     [email or web address]        [employer]              ACCOUNT NO.:  [id number]  MEDICAL RECORD NO.:  [id number]          PATIENT TYPE:  OUT  LOCATION:  SLEEP LAB                     FACILITY:  [facility]  PHYSICIAN:  Shivank Pinedo [name], M.D. DATE OF BIRTH:  1960/04/29  DATE OF STUDY:  03/15/2012                           NOCTURNAL POLYSOMNOGRAM  REFERRING PHYSICIAN:  Rollene Rotunda, MD, [facility]  INDICATIONS:  A 52 year old, who presents with hypersomnia, fatigue and obesity.  The study is being done to evaluate for obstructive sleep apnea syndrome.   MEDICATIONS:  Prinivil, aspirin, metoprolol, Lipitor, nitroglycerin, Norvasc, Chantix.  Epworth Sleepiness Scale 9.  BMI 37.  ARCHITECTURAL SUMMARY:  The total recording time is 418 minutes.  Sleep efficiency 73%.  Sleep latency 7.5 minutes.  REM latency 110 minutes. Stage N1 15%, N2 42%, N3 11%, and REM sleep 6%.  RESPIRATORY SUMMARY:  Baseline oxygen saturation is 93.  Lowest saturation 85 during REM sleep.  Diagnostic AHI is 12 and RDI 15.  LIMB MOVEMENT SUMMARY:  PLM index 0.  ELECTROCARDIOGRAM SUMMARY:  Average heart rate is 84 with no significant dysrhythmias observed.  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 Consider formal CPAP titration study.  Thanks for this referral.    [name] [name], M.D.    KAD/MEDQ  D:  03/24/2012 17:02:34  T:  03/24/2012 17:46:38  Job:  [id number]

## 2012-04-03 ENCOUNTER — Telehealth: Payer: Self-pay | Admitting: *Deleted

## 2012-04-03 DIAGNOSIS — G479 Sleep disorder, unspecified: Secondary | ICD-10-CM

## 2012-04-03 NOTE — Telephone Encounter (Signed)
Message copied by Eustace Moore on Wed Apr 03, 2012 10:36 AM ------      Message from: Rollene Rotunda      Created: Mon Apr 01, 2012  9:13 PM       Refer to Dr. Gerilyn Pilgrim for sleep management. ------

## 2012-04-03 NOTE — Telephone Encounter (Signed)
Left message for patient to call office.  

## 2012-04-11 NOTE — Telephone Encounter (Signed)
Left message for pt to call the office to discuss results of sleep study and need for referral to Dr Gerilyn Pilgrim for possible CPAP.

## 2012-04-12 ENCOUNTER — Telehealth: Payer: Self-pay | Admitting: Cardiology

## 2012-04-12 NOTE — Telephone Encounter (Signed)
New Problem:     Patient called in wanting to know the results of his recent sleep study.  Please call back.

## 2012-04-15 NOTE — Telephone Encounter (Signed)
Pt aware of results according to documentation by Carlye Grippe.  Referral has been placed for pt to go to be evaluated for CPAP  Left message for pt of this information.

## 2012-04-15 NOTE — Telephone Encounter (Signed)
Patient informed. 

## 2012-04-15 NOTE — Addendum Note (Signed)
Addended by: Sharin Grave on: 04/15/2012 11:28 AM   Modules accepted: Orders

## 2012-04-17 ENCOUNTER — Ambulatory Visit (INDEPENDENT_AMBULATORY_CARE_PROVIDER_SITE_OTHER): Payer: BC Managed Care – PPO | Admitting: Cardiology

## 2012-04-17 ENCOUNTER — Encounter: Payer: Self-pay | Admitting: Cardiology

## 2012-04-17 VITALS — BP 159/99 | HR 81 | Ht 72.0 in | Wt 270.0 lb

## 2012-04-17 DIAGNOSIS — I10 Essential (primary) hypertension: Secondary | ICD-10-CM

## 2012-04-17 DIAGNOSIS — R072 Precordial pain: Secondary | ICD-10-CM

## 2012-04-17 DIAGNOSIS — E663 Overweight: Secondary | ICD-10-CM

## 2012-04-17 DIAGNOSIS — I2510 Atherosclerotic heart disease of native coronary artery without angina pectoris: Secondary | ICD-10-CM

## 2012-04-17 DIAGNOSIS — F17200 Nicotine dependence, unspecified, uncomplicated: Secondary | ICD-10-CM

## 2012-04-17 DIAGNOSIS — E785 Hyperlipidemia, unspecified: Secondary | ICD-10-CM

## 2012-04-17 MED ORDER — HYDROCHLOROTHIAZIDE 12.5 MG PO CAPS: 12.5000 mg | ORAL_CAPSULE | Freq: Every day | ORAL | Status: DC

## 2012-04-17 NOTE — Patient Instructions (Addendum)
Please start hydrochlorothiazide 12.5 mg a day Continue all other medications as listed.  You have been scheduled to see Dr Jackelyn Knife in [place], [place] for sleep apnea The appointment is for March 25 at 3 pm [address] [place], [place]  [phone]  Follow up with Dr Antoine Poche in 4 months.

## 2012-04-17 NOTE — Progress Notes (Signed)
HPI Patient presents for evaluation of coronary artery disease. He had a history of circumflex stenting in another town in 2008. He presented urgently in 10/12 with unstable angina and ST segment elevation and was found to have an occluded diagonal. He had a drug-eluting stent placed. He had 50% LAD stenosis and 30% circumflex in-stent stenosis that was managed medically.   At the last visit we discussed tobacco cessation. He has quit smoking! I added Norvasc for blood pressure control. I ordered a sleep study which demonstrated moderate sleep apnea. I switched him from simvastatin to Lipitor. He has done well. He's had some fleeting chest discomfort at rest unlike his previous angina. He's not having any of his previous chest pressure and has no neck or arm discomfort. He's not having any palpitations, presyncope or syncope. He has no PND or orthopnea. He's had no new shortness of breath, weight gain or edema.   No Known Allergies  Current Outpatient Prescriptions  Medication Sig Dispense Refill  . AMLODIPINE BESYLATE PO Take 5 mg by mouth daily.      Marland Kitchen aspirin 81 MG tablet Take 81 mg by mouth daily.        Marland Kitchen atorvastatin (LIPITOR) 40 MG tablet Take 1 tablet (40 mg total) by mouth daily.  30 tablet  11  . lisinopril (PRINIVIL,ZESTRIL) 40 MG tablet Take 1 tablet (40 mg total) by mouth daily.  30 tablet  11  . metoprolol (LOPRESSOR) 50 MG tablet TAKE ONE TABLET BY MOUTH TWICE DAILY  60 tablet  11  . nitroGLYCERIN (NITROSTAT) 0.4 MG SL tablet Place 0.4 mg under the tongue every 5 (five) minutes as needed. May repeat for up to 3 doses.       . varenicline (CHANTIX) 1 MG tablet Take 1 tablet (1 mg total) by mouth 2 (two) times daily.  60 tablet  3   No current facility-administered medications for this visit.    Past Medical History  Diagnosis Date  . Overweight   . Hypertension   . Hyperlipidemia, mixed   . CAD in native artery     Single-vessel CAD with stent placement to the circumflex  coronary artery in 2008,  DES to Diag 10/12  . GERD (gastroesophageal reflux disease)   . Tobacco abuse     Past Surgical History  Procedure Laterality Date  . Coronary stent placement  2008, 2012   ROS: Positive for snoring, headaches, daytime somnolence.  Otherwise as stated in the HPI and negative for all other systems.  PHYSICAL EXAM BP 159/99  Pulse 81  Ht 6' (1.829 m)  Wt 270 lb (122.471 kg)  BMI 36.61 kg/m2 GENERAL:  Well appearing HEENT:  Pupils equal round and reactive, fundi not visualized, oral mucosa unremarkable NECK:  No jugular venous distention, waveform within normal limits, carotid upstroke brisk and symmetric, no bruits, no thyromegaly LYMPHATICS:  No cervical, inguinal adenopathy LUNGS:  Clear to auscultation bilaterally BACK:  No CVA tenderness CHEST:  Unremarkable HEART:  PMI not displaced or sustained,S1 and S2 within normal limits, no S3, no S4, no clicks, no rubs, no murmurs ABD:  Flat, positive bowel sounds normal in frequency in pitch, no bruits, no rebound, no guarding, no midline pulsatile mass, no hepatomegaly, no splenomegaly, obese EXT:  2 plus pulses throughout, no edema, no cyanosis no clubbing   ASSESSMENT AND PLAN   CAD, NATIVE VESSEL -  The patient has no new sypmtoms. No further cardiovascular testing is indicated. We will continue with aggressive  risk reduction and meds as listed.   I am proud of his progress.  HYPERTENSION, UNSPECIFIED -  His blood pressure is not at target I will HCTZ 12.5 mg daily.  HYPERLIPIDEMIA-MIXED -  He will continue on the meds as listed.  TOBACCO ABUSE -  He quit smoking!! I congratulated him.  OVERWEIGHT/OBESITY -  The patient understands the need to lose weight with diet and exercise.   SLEEP APNEA -  He had mild to moderate sleep apnea and it has requested followup with his sleep physician.

## 2012-05-16 ENCOUNTER — Other Ambulatory Visit: Payer: Self-pay

## 2012-05-16 DIAGNOSIS — G4730 Sleep apnea, unspecified: Secondary | ICD-10-CM

## 2012-06-07 ENCOUNTER — Ambulatory Visit: Payer: BC Managed Care – PPO | Attending: Cardiology | Admitting: Sleep Medicine

## 2012-06-07 ENCOUNTER — Other Ambulatory Visit: Payer: Self-pay

## 2012-06-07 DIAGNOSIS — G4730 Sleep apnea, unspecified: Secondary | ICD-10-CM

## 2012-06-07 DIAGNOSIS — G4733 Obstructive sleep apnea (adult) (pediatric): Secondary | ICD-10-CM | POA: Insufficient documentation

## 2012-06-07 DIAGNOSIS — G4710 Hypersomnia, unspecified: Secondary | ICD-10-CM

## 2012-06-07 DIAGNOSIS — Z6836 Body mass index (BMI) 36.0-36.9, adult: Secondary | ICD-10-CM | POA: Insufficient documentation

## 2012-06-08 NOTE — Patient Instructions (Signed)
CPAP and BIPAP  CPAP and BIPAP are methods of helping you breathe. CPAP stands for "continuous positive airway pressure." BIPAP stands for "bi-level positive airway pressure." Both CPAP and BIPAP are provided by a small machine with a flexible plastic tube that attaches to a plastic mask that goes over your nose or mouth. Air is blown into your air passages through your nose or mouth. This helps to keep your airways open and helps to keep you breathing well.  The amount of pressure that is used to blow the air into your air passages can be set on the machine. The pressure setting is based on your needs. With CPAP, the amount of pressure stays the same while you breathe in and out. With BIPAP, the amount of pressure changes when you inhale and exhale. Your caregiver will recommend whether CPAP or BIPAP would be more helpful for you.    CPAP and BIPAP can be helpful for both adults and children with:   Sleep apnea.   Chronic Obstructive Pulmonary Disease (COPD), a condition like emphysema.   Diseases which weaken the muscles of the chest such as muscular dystrophy or neurological diseases.   Other problems that cause breathing to be weak or difficult.  USE OF CPAP OR BIPAP  The respiratory therapist or technician will help you get used to wearing the mask. Some people feel claustrophobic (a trapped or closed in feeling) at first, because the mask needs to be fairly snug on your face.     It may help you to get used to the mask gradually, by first holding the mask loosely over your nose or mouth using a low pressure setting on the machine. Gradually the mask can be applied more snugly with increased pressure. You can also gradually increase the amount of time the mask is used.   People with sleep apnea will use the mask and machine at night when they are sleeping. Others, like those with ALS or other breathing difficulties, may need the CPAP or BIPAP all the time.    If the first mask you try does not fit well, or is uncomfortable, there are other types and sizes that can be tried.   If you tend to breathe through your mouth, a chin strap may be applied to help keep your mouth closed (if you are using a nasal mask).   The CPAP and BIPAP machines have alarms that may sound if the mask comes off or develops a leak.   You should not eat or drink while the CPAP or BIPAP is on. Food or fluids could get pushed into your lungs by the pressure of the CPAP or BIPAP.  Sometimes CPAP or BIPAP machines are ordered for home use. If you are going to use the CPAP or BIPAP machine at home, follow these instructions   CPAP or BIPAP machines can be rented or purchased through home health care companies. There are many different brands of machines available. If you rent a machine before purchasing you may find which particular machine works well for you.   Ask questions if there is something you do not understand when picking out your machine.   Place your CPAP or BIPAP machine on a secure table or stand near an electrical outlet.   Know where the On/Off switch is.   Follow your doctor's instructions for how to set the pressure on your machine and when you should use it.   Do not smoke! Tobacco smoke residue can damage the   machine.  SEEK IMMEDIATE MEDICAL CARE IF:     You have redness or open areas around your nose or mouth.   You have trouble operating the CPAP or BIPAP machine.   You cannot tolerate wearing the CPAP or BIPAP mask.   You have any questions or concerns.  Document Released: 10/29/2003 Document Revised: 04/24/2011 Document Reviewed: 01/28/2008  [employer] Patient Information 2013 [employer].

## 2012-06-08 NOTE — Procedures (Signed)
[facility] Demetrio Leighty [name], MD     [email or web address]        NAM[employer], [place]              ACCOUNT NO.:  [id number]  MEDICAL RECORD NO.:  [id number]          PATIENT TYPE:  OUT  LOCATION:  SLEEP LAB                     FACILITY:  [facility]  PHYSICIAN:  Prentiss Hammett [name], M.D. DATE OF BIRTH:  01/24/1961  DATE OF STUDY:  06/07/2012                           NOCTURNAL POLYSOMNOGRAM  REFERRING PHYSICIAN:  Samuel Jester, MD  REFERRING PHYSICIAN:  Dr. Rollene Rotunda.  INDICATION:  A 52 year old, who had a previous diagnostic study showed an obstructive sleep apnea syndrome.  This is a CPAP titration recording.  MEDICATIONS:  Metoprolol, lisinopril, Norvasc, hydrochlorothiazide, aspirin, nitroglycerin, Lipitor.  Epworth Sleepiness Scale 9.  BMI 36.  ARCHITECTURAL SUMMARY:  The total recording time is 423 minutes.  Sleep efficiency 90%.  Sleep latency 14 minutes.  REM latency 34 minutes. Stage N1 is 8%, N2 is 50%, N3 is 21%, and REM sleep 21%.  RESPIRATORY SUMMARY:  Baseline oxygen saturation is 94, lowest saturation 87 during non-REM sleep.  The patient was placed on positive pressure that started at a pressure of 5 and increased to a high of 12. The patient did well anywhere from 10-12.  LIMB MOVEMENT SUMMARY:  PLM index 0.  ELECTROCARDIOGRAM SUMMARY:  Average heart rate is 73 with no significant arrhythmias observed.  IMPRESSION: 1. Obstructive sleep apnea syndrome which responds well to a     continuous positive airway pressure levels between 10 and 12.  I     recommend the lowest effective pressure of 10 however. 2. 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     The differential diagnosis includes rapid eye movement rebound     phenomenon or narcolepsy in this situation, rapid eye movement     rebound phenomenon is a most likely etiology.  Thanks for this referral.   [name] [name], M.D.    KAD/MEDQ  D:  06/08/2012  78:29:56  T:  06/08/2012 21:30:86  Job:  [id number]

## 2012-07-11 ENCOUNTER — Telehealth: Payer: Self-pay | Admitting: Cardiology

## 2012-07-11 DIAGNOSIS — I2510 Atherosclerotic heart disease of native coronary artery without angina pectoris: Secondary | ICD-10-CM

## 2012-07-11 DIAGNOSIS — R072 Precordial pain: Secondary | ICD-10-CM

## 2012-07-11 NOTE — Telephone Encounter (Signed)
Per wife - pt needs a GXT for DOT physical.  Would like to have it done in [place].  Aware I will obtain order from Dr Antoine Poche and we will call back with an appointment.

## 2012-07-11 NOTE — Telephone Encounter (Signed)
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

## 2012-07-15 NOTE — Telephone Encounter (Signed)
OK to have ETT at [facility].

## 2012-07-17 ENCOUNTER — Encounter: Payer: Self-pay | Admitting: *Deleted

## 2012-07-17 NOTE — Telephone Encounter (Signed)
F/u    pts wife calling about stress test

## 2012-07-17 NOTE — Telephone Encounter (Signed)
Doesn't need to hold any meds

## 2012-07-24 DIAGNOSIS — I2510 Atherosclerotic heart disease of native coronary artery without angina pectoris: Secondary | ICD-10-CM

## 2012-07-29 ENCOUNTER — Telehealth: Payer: Self-pay | Admitting: *Deleted

## 2012-07-29 NOTE — Telephone Encounter (Signed)
Message copied by Eustace Moore on Mon Jul 29, 2012 10:31 AM ------      Message from: Rollene Rotunda      Created: Mon Jul 29, 2012  8:12 AM       This was a normal result.  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  Call Mr. Allman with the results and send results to Samuel Jester, DO       ------

## 2012-08-05 ENCOUNTER — Encounter: Payer: Self-pay | Admitting: *Deleted

## 2012-08-05 NOTE — Telephone Encounter (Signed)
Letter mailed and copy sent to PCP. 

## 2013-03-28 ENCOUNTER — Other Ambulatory Visit: Payer: Self-pay

## 2013-03-28 MED ORDER — ATORVASTATIN CALCIUM 40 MG PO TABS: 40.0000 mg | ORAL_TABLET | Freq: Every day | ORAL | Status: DC

## 2013-03-28 MED ORDER — AMLODIPINE BESYLATE 5 MG PO TABS: 5.0000 mg | ORAL_TABLET | Freq: Every day | ORAL | Status: DC

## 2013-09-05 ENCOUNTER — Other Ambulatory Visit: Payer: Self-pay

## 2013-09-05 MED ORDER — HYDROCHLOROTHIAZIDE 12.5 MG PO CAPS: 12.5000 mg | ORAL_CAPSULE | Freq: Every day | ORAL | Status: DC

## 2013-11-26 ENCOUNTER — Other Ambulatory Visit: Payer: Self-pay

## 2013-11-26 MED ORDER — ATORVASTATIN CALCIUM 40 MG PO TABS: 40.0000 mg | ORAL_TABLET | Freq: Every day | ORAL | Status: DC

## 2013-11-27 ENCOUNTER — Telehealth: Payer: Self-pay | Admitting: *Deleted

## 2013-11-27 ENCOUNTER — Other Ambulatory Visit: Payer: Self-pay | Admitting: *Deleted

## 2013-11-27 MED ORDER — NITROGLYCERIN 0.4 MG SL SUBL: 0.4000 mg | SUBLINGUAL_TABLET | SUBLINGUAL | Status: DC | PRN

## 2013-11-27 NOTE — Telephone Encounter (Signed)
[facility] in [place] requests nitro refill for patient. He is more that one year overdue. Please advise. Thanks, MI

## 2013-11-27 NOTE — Telephone Encounter (Signed)
Fill the ntg and make him an appt.

## 2014-04-17 ENCOUNTER — Other Ambulatory Visit: Payer: Self-pay | Admitting: Cardiology

## 2014-04-17 MED ORDER — AMLODIPINE BESYLATE 5 MG PO TABS: 5.0000 mg | ORAL_TABLET | Freq: Every day | ORAL | Status: DC

## 2014-04-27 ENCOUNTER — Telehealth: Payer: Self-pay

## 2014-04-27 ENCOUNTER — Other Ambulatory Visit: Payer: Self-pay

## 2014-04-27 MED ORDER — AMLODIPINE BESYLATE 5 MG PO TABS: 5.0000 mg | ORAL_TABLET | Freq: Every day | ORAL | Status: DC

## 2014-04-27 MED ORDER — HYDROCHLOROTHIAZIDE 12.5 MG PO CAPS: 12.5000 mg | ORAL_CAPSULE | Freq: Every day | ORAL | Status: DC

## 2014-04-27 MED ORDER — NITROGLYCERIN 0.4 MG SL SUBL: 0.4000 mg | SUBLINGUAL_TABLET | SUBLINGUAL | Status: DC | PRN

## 2014-04-27 MED ORDER — ATORVASTATIN CALCIUM 40 MG PO TABS: ORAL_TABLET | ORAL | Status: DC

## 2014-04-27 MED ORDER — METOPROLOL TARTRATE 50 MG PO TABS: 50.0000 mg | ORAL_TABLET | Freq: Two times a day (BID) | ORAL | Status: DC

## 2014-04-27 MED ORDER — LISINOPRIL 40 MG PO TABS: 40.0000 mg | ORAL_TABLET | Freq: Every day | ORAL | Status: DC

## 2014-04-27 NOTE — Telephone Encounter (Signed)
You can add him on.

## 2014-04-27 NOTE — Telephone Encounter (Signed)
Pt is not having any medical problems.  Was concerned because he needs refills on his medication which were sent into pharmacy today.  Pt will keep the appointment as scheduled 05/13/14 in [place].

## 2014-05-13 ENCOUNTER — Encounter: Payer: Self-pay | Admitting: Cardiology

## 2014-05-13 ENCOUNTER — Ambulatory Visit (INDEPENDENT_AMBULATORY_CARE_PROVIDER_SITE_OTHER): Payer: BLUE CROSS/BLUE SHIELD | Admitting: Cardiology

## 2014-05-13 VITALS — BP 180/82 | HR 92 | Ht 73.0 in | Wt 272.0 lb

## 2014-05-13 DIAGNOSIS — I10 Essential (primary) hypertension: Secondary | ICD-10-CM

## 2014-05-13 MED ORDER — VARENICLINE TARTRATE 1 MG PO TABS: 1.0000 mg | ORAL_TABLET | Freq: Two times a day (BID) | ORAL | Status: DC

## 2014-05-13 MED ORDER — NITROGLYCERIN 0.4 MG SL SUBL: 0.4000 mg | SUBLINGUAL_TABLET | SUBLINGUAL | Status: DC | PRN

## 2014-05-13 MED ORDER — AMLODIPINE BESYLATE 10 MG PO TABS: 10.0000 mg | ORAL_TABLET | Freq: Every day | ORAL | Status: DC

## 2014-05-13 MED ORDER — VARENICLINE TARTRATE 0.5 MG X 11 & 1 MG X 42 PO MISC: ORAL | Status: DC

## 2014-05-13 MED ORDER — SILDENAFIL CITRATE 50 MG PO TABS: 50.0000 mg | ORAL_TABLET | Freq: Every day | ORAL | Status: DC | PRN

## 2014-05-13 NOTE — Progress Notes (Signed)
HPI Patient presents for evaluation of coronary artery disease. He had a history of circumflex stenting in another town in 2008. He presented urgently in 10/12 with unstable angina and ST segment elevation and was found to have an occluded diagonal. He had a drug-eluting stent placed. He had 50% LAD stenosis and 30% circumflex in-stent stenosis that was managed medically.  I last saw him in 2014.  Since I last saw him he has started smoking again unfortunately. He does feel better because he was diagnosed with sleep apnea and is doing very well with Sedapap.The patient denies any new symptoms such as chest discomfort, neck or arm discomfort. There has been no new shortness of breath, PND or orthopnea. There have been no reported palpitations, presyncope or syncope.   No Known Allergies  Current Outpatient Prescriptions  Medication Sig Dispense Refill  . amLODipine (NORVASC) 5 MG tablet Take 1 tablet (5 mg total) by mouth daily. 30 tablet 0  . aspirin 81 MG tablet Take 81 mg by mouth daily.      Marland Kitchen atorvastatin (LIPITOR) 40 MG tablet TAKE ONE TABLET BY MOUTH ONCE DAILY 30 tablet 0  . buPROPion (WELLBUTRIN XL) 150 MG 24 hr tablet Take 150 mg by mouth daily. Take 3 tablets daily  0  . hydrochlorothiazide (MICROZIDE) 12.5 MG capsule Take 1 capsule (12.5 mg total) by mouth daily. 30 capsule 0  . lisinopril (PRINIVIL,ZESTRIL) 40 MG tablet Take 1 tablet (40 mg total) by mouth daily. 30 tablet 0  . metoprolol (LOPRESSOR) 50 MG tablet Take 1 tablet (50 mg total) by mouth 2 (two) times daily. 60 tablet 0  . nitroGLYCERIN (NITROSTAT) 0.4 MG SL tablet Place 1 tablet (0.4 mg total) under the tongue every 5 (five) minutes as needed. May repeat for up to 3 doses. 25 tablet 0  . varenicline (CHANTIX) 1 MG tablet Take 1 tablet (1 mg total) by mouth 2 (two) times daily. (Patient not taking: Reported on 05/13/2014) 60 tablet 3   No current facility-administered medications for this visit.    Past Medical  History  Diagnosis Date  . Overweight(278.02)   . Hypertension   . Hyperlipidemia, mixed   . CAD in native artery     Single-vessel CAD with stent placement to the circumflex coronary artery in 2008,  DES to Diag 10/12  . GERD (gastroesophageal reflux disease)   . Tobacco abuse     Past Surgical History  Procedure Laterality Date  . Coronary stent placement  2008, 2012   ROS: Positive for snoring, headaches, daytime somnolence.  Otherwise as stated in the HPI and negative for all other systems.  PHYSICAL EXAM BP 180/82 mmHg  Pulse 92  Ht 6\' 1"  (1.854 m)  Wt 272 lb (123.378 kg)  BMI 35.89 kg/m2 GENERAL:  Well appearing HEENT:  Pupils equal round and reactive, fundi not visualized, oral mucosa unremarkable NECK:  No jugular venous distention, waveform within normal limits, carotid upstroke brisk and symmetric, no bruits, no thyromegaly LYMPHATICS:  No cervical, inguinal adenopathy LUNGS:  Clear to auscultation bilaterally BACK:  No CVA tenderness CHEST:  Unremarkable HEART:  PMI not displaced or sustained,S1 and S2 within normal limits, no S3, no S4, no clicks, no rubs, no murmurs ABD:  Flat, positive bowel sounds normal in frequency in pitch, no bruits, no rebound, no guarding, no midline pulsatile mass, no hepatomegaly, no splenomegaly, obese EXT:  2 plus pulses throughout, no edema, no cyanosis no clubbing  EKG: Sinus rhythm, rate 92, axis  within normal limits, intervals within normal limits, nonspecific inferior T-wave.  05/13/2014   ASSESSMENT AND PLAN   CAD, NATIVE VESSEL -  The patient has no new sypmtoms. No further cardiovascular testing is indicated. We will continue with aggressive risk reduction and meds as listed.   I am proud of his progress.  HYPERTENSION, UNSPECIFIED -  I will have him increase the amlodipine to 10 mg.    HYPERLIPIDEMIA-MIXED -  He will continue on the meds as listed.  This is followed by BUTLER, CYNTHIA, DO  TOBACCO ABUSE -  He does  want to try Chantix.  We discussed all of the potential side effects and in particular I reviewed the [employer].  He has no depression.  He understands and wishes to try this medication.  He will be stopping his Wellbutrin which she was using for smoking.  OVERWEIGHT/OBESITY -  The patient understands the need to lose weight with diet and exercise.   SLEEP APNEA -  He feels much better on CPAP.  He will continue this.

## 2014-05-13 NOTE — Patient Instructions (Addendum)
Please stop Wellbutrin. Increase Amlodipine to 10 mg a day. You may start Chantix as directed. You may use Viagra as needed. Continue all other medications as listed.  Follow up in 1 year with Dr. Percival Spanish.  You will receive a letter in the mail 2 months before you are due.  Please call us when you receive this letter to schedule your follow up appointment.  Thank you for choosing [facility]!!

## 2014-06-16 ENCOUNTER — Other Ambulatory Visit: Payer: Self-pay | Admitting: *Deleted

## 2014-06-16 MED ORDER — LISINOPRIL 40 MG PO TABS: 40.0000 mg | ORAL_TABLET | Freq: Every day | ORAL | Status: DC

## 2014-07-03 ENCOUNTER — Encounter (HOSPITAL_COMMUNITY)
Admission: EM | Disposition: A | Discharge status: Home / Self Care | Payer: BLUE CROSS/BLUE SHIELD | Source: Home / Self Care | Attending: Internal Medicine

## 2014-07-03 ENCOUNTER — Encounter (HOSPITAL_COMMUNITY): Payer: Self-pay | Admitting: *Deleted

## 2014-07-03 ENCOUNTER — Inpatient Hospital Stay (HOSPITAL_COMMUNITY)
Admission: EM | Admit: 2014-07-03 | Discharge: 2014-07-04 | DRG: 247 | Disposition: A | Discharge status: Home / Self Care | Payer: BLUE CROSS/BLUE SHIELD | Attending: Internal Medicine | Admitting: Internal Medicine

## 2014-07-03 ENCOUNTER — Emergency Department (HOSPITAL_COMMUNITY): Payer: BLUE CROSS/BLUE SHIELD

## 2014-07-03 DIAGNOSIS — I252 Old myocardial infarction: Secondary | ICD-10-CM

## 2014-07-03 DIAGNOSIS — E782 Mixed hyperlipidemia: Secondary | ICD-10-CM | POA: Diagnosis present

## 2014-07-03 DIAGNOSIS — Z8249 Family history of ischemic heart disease and other diseases of the circulatory system: Secondary | ICD-10-CM | POA: Diagnosis not present

## 2014-07-03 DIAGNOSIS — Z7982 Long term (current) use of aspirin: Secondary | ICD-10-CM | POA: Diagnosis not present

## 2014-07-03 DIAGNOSIS — E669 Obesity, unspecified: Secondary | ICD-10-CM | POA: Diagnosis present

## 2014-07-03 DIAGNOSIS — I249 Acute ischemic heart disease, unspecified: Secondary | ICD-10-CM | POA: Diagnosis present

## 2014-07-03 DIAGNOSIS — J811 Chronic pulmonary edema: Secondary | ICD-10-CM | POA: Diagnosis present

## 2014-07-03 DIAGNOSIS — I10 Essential (primary) hypertension: Secondary | ICD-10-CM | POA: Diagnosis present

## 2014-07-03 DIAGNOSIS — Z9861 Coronary angioplasty status: Secondary | ICD-10-CM

## 2014-07-03 DIAGNOSIS — K219 Gastro-esophageal reflux disease without esophagitis: Secondary | ICD-10-CM | POA: Diagnosis present

## 2014-07-03 DIAGNOSIS — G4730 Sleep apnea, unspecified: Secondary | ICD-10-CM | POA: Diagnosis present

## 2014-07-03 DIAGNOSIS — I152 Hypertension secondary to endocrine disorders: Secondary | ICD-10-CM | POA: Diagnosis present

## 2014-07-03 DIAGNOSIS — E785 Hyperlipidemia, unspecified: Secondary | ICD-10-CM | POA: Diagnosis present

## 2014-07-03 DIAGNOSIS — Z79899 Other long term (current) drug therapy: Secondary | ICD-10-CM | POA: Diagnosis not present

## 2014-07-03 DIAGNOSIS — G4733 Obstructive sleep apnea (adult) (pediatric): Secondary | ICD-10-CM | POA: Diagnosis present

## 2014-07-03 DIAGNOSIS — I2510 Atherosclerotic heart disease of native coronary artery without angina pectoris: Secondary | ICD-10-CM

## 2014-07-03 DIAGNOSIS — R079 Chest pain, unspecified: Secondary | ICD-10-CM | POA: Diagnosis not present

## 2014-07-03 DIAGNOSIS — Z955 Presence of coronary angioplasty implant and graft: Secondary | ICD-10-CM

## 2014-07-03 DIAGNOSIS — Z6836 Body mass index (BMI) 36.0-36.9, adult: Secondary | ICD-10-CM

## 2014-07-03 DIAGNOSIS — I200 Unstable angina: Secondary | ICD-10-CM | POA: Diagnosis present

## 2014-07-03 DIAGNOSIS — E1159 Type 2 diabetes mellitus with other circulatory complications: Secondary | ICD-10-CM | POA: Diagnosis present

## 2014-07-03 DIAGNOSIS — I25110 Atherosclerotic heart disease of native coronary artery with unstable angina pectoris: Principal | ICD-10-CM | POA: Diagnosis present

## 2014-07-03 DIAGNOSIS — F17210 Nicotine dependence, cigarettes, uncomplicated: Secondary | ICD-10-CM | POA: Diagnosis present

## 2014-07-03 HISTORY — PX: CARDIAC CATHETERIZATION: SHX172

## 2014-07-03 LAB — I-STAT TROPONIN, ED: Troponin i, poc: 0 ng/mL (ref 0.00–0.08)

## 2014-07-03 LAB — CBC
HEMATOCRIT: 46.7 % (ref 39.0–52.0)
Hemoglobin: 15.7 g/dL (ref 13.0–17.0)
MCH: 31.9 pg (ref 26.0–34.0)
MCHC: 33.6 g/dL (ref 30.0–36.0)
MCV: 94.9 fL (ref 78.0–100.0)
PLATELETS: 254 10*3/uL (ref 150–400)
RBC: 4.92 MIL/uL (ref 4.22–5.81)
RDW: 12.6 % (ref 11.5–15.5)
WBC: 8.9 10*3/uL (ref 4.0–10.5)

## 2014-07-03 LAB — TSH: TSH: 1.532 u[IU]/mL (ref 0.350–4.500)

## 2014-07-03 LAB — CREATININE, SERUM: Creatinine, Ser: 0.9 mg/dL (ref 0.61–1.24)

## 2014-07-03 LAB — CBC WITH DIFFERENTIAL/PLATELET
BASOS PCT: 1 % (ref 0–1)
Basophils Absolute: 0.1 10*3/uL (ref 0.0–0.1)
EOS ABS: 0.5 10*3/uL (ref 0.0–0.7)
Eosinophils Relative: 7 % — ABNORMAL HIGH (ref 0–5)
HCT: 47.8 % (ref 39.0–52.0)
Hemoglobin: 15.9 g/dL (ref 13.0–17.0)
Lymphocytes Relative: 22 % (ref 12–46)
Lymphs Abs: 1.6 10*3/uL (ref 0.7–4.0)
MCH: 31.4 pg (ref 26.0–34.0)
MCHC: 33.3 g/dL (ref 30.0–36.0)
MCV: 94.3 fL (ref 78.0–100.0)
Monocytes Absolute: 0.6 10*3/uL (ref 0.1–1.0)
Monocytes Relative: 9 % (ref 3–12)
Neutro Abs: 4.5 10*3/uL (ref 1.7–7.7)
Neutrophils Relative %: 61 % (ref 43–77)
Platelets: 260 10*3/uL (ref 150–400)
RBC: 5.07 MIL/uL (ref 4.22–5.81)
RDW: 12.6 % (ref 11.5–15.5)
WBC: 7.3 10*3/uL (ref 4.0–10.5)

## 2014-07-03 LAB — TROPONIN I: Troponin I: <0.03 ng/mL (ref <0.031)

## 2014-07-03 LAB — BASIC METABOLIC PANEL
Anion gap: 8 (ref 5–15)
BUN: 13 mg/dL (ref 6–20)
CHLORIDE: 108 mmol/L (ref 101–111)
CO2: 23 mmol/L (ref 22–32)
CREATININE: 0.89 mg/dL (ref 0.61–1.24)
Calcium: 8.9 mg/dL (ref 8.9–10.3)
GFR calc non Af Amer: >60 mL/min (ref >60)
GLUCOSE: 95 mg/dL (ref 65–99)
POTASSIUM: 4.2 mmol/L (ref 3.5–5.1)
Sodium: 139 mmol/L (ref 135–145)

## 2014-07-03 LAB — BRAIN NATRIURETIC PEPTIDE: B NATRIURETIC PEPTIDE 5: 18.9 pg/mL (ref 0.0–100.0)

## 2014-07-03 LAB — PROTIME-INR
INR: 1.05 (ref 0.00–1.49)
PROTHROMBIN TIME: 13.9 s (ref 11.6–15.2)

## 2014-07-03 LAB — POCT ACTIVATED CLOTTING TIME: Activated Clotting Time: 447 seconds

## 2014-07-03 SURGERY — CORONARY STENT INTERVENTION

## 2014-07-03 MED ORDER — TICAGRELOR 90 MG PO TABS
ORAL_TABLET | ORAL | Status: AC
  Filled 2014-07-03: qty 2

## 2014-07-03 MED ORDER — ATORVASTATIN CALCIUM 40 MG PO TABS
40.0000 mg | ORAL_TABLET | Freq: Every day | ORAL | Status: DC
  Administered 2014-07-04: 10:00:00 40 mg via ORAL
  Filled 2014-07-03: qty 1

## 2014-07-03 MED ORDER — SODIUM CHLORIDE 0.9 % IV SOLN
0.2500 mg/kg/h | INTRAVENOUS | Status: DC
  Filled 2014-07-03: qty 250

## 2014-07-03 MED ORDER — BIVALIRUDIN 250 MG IV SOLR
INTRAVENOUS | Status: AC
  Filled 2014-07-03: qty 250

## 2014-07-03 MED ORDER — BIVALIRUDIN 250 MG IV SOLR
250.0000 mg | INTRAVENOUS | Status: DC | PRN
  Administered 2014-07-03: 1.75 mg/kg/h via INTRAVENOUS

## 2014-07-03 MED ORDER — SODIUM CHLORIDE 0.9 % IV SOLN: 250.0000 mL | INTRAVENOUS | Status: DC | PRN

## 2014-07-03 MED ORDER — BIVALIRUDIN BOLUS VIA INFUSION - CUPID
INTRAVENOUS | Status: DC | PRN
  Administered 2014-07-03: 91.875 mg via INTRAVENOUS

## 2014-07-03 MED ORDER — ASPIRIN 81 MG PO CHEW
81.0000 mg | CHEWABLE_TABLET | Freq: Every day | ORAL | Status: DC
  Filled 2014-07-03: qty 1

## 2014-07-03 MED ORDER — NITROGLYCERIN 0.4 MG SL SUBL: 0.4000 mg | SUBLINGUAL_TABLET | SUBLINGUAL | Status: DC | PRN

## 2014-07-03 MED ORDER — VARENICLINE TARTRATE 1 MG PO TABS
1.0000 mg | ORAL_TABLET | Freq: Two times a day (BID) | ORAL | Status: DC
  Administered 2014-07-03: 1 mg via ORAL
  Filled 2014-07-03 (×4): qty 1

## 2014-07-03 MED ORDER — ONDANSETRON HCL 4 MG/2ML IJ SOLN: 4.0000 mg | Freq: Four times a day (QID) | INTRAMUSCULAR | Status: DC | PRN

## 2014-07-03 MED ORDER — MIDAZOLAM HCL 2 MG/2ML IJ SOLN
INTRAMUSCULAR | Status: AC
  Filled 2014-07-03: qty 2

## 2014-07-03 MED ORDER — SODIUM CHLORIDE 0.9 % WEIGHT BASED INFUSION: 1.0000 mL/kg/h | INTRAVENOUS | Status: DC

## 2014-07-03 MED ORDER — ANGIOPLASTY BOOK
Freq: Once | Status: DC
  Filled 2014-07-03: qty 1

## 2014-07-03 MED ORDER — HYDROCHLOROTHIAZIDE 12.5 MG PO CAPS
12.5000 mg | ORAL_CAPSULE | Freq: Every day | ORAL | Status: DC
  Administered 2014-07-04: 10:00:00 12.5 mg via ORAL
  Filled 2014-07-03: qty 1

## 2014-07-03 MED ORDER — FENTANYL CITRATE (PF) 100 MCG/2ML IJ SOLN
INTRAMUSCULAR | Status: DC | PRN
  Administered 2014-07-03 (×2): 25 ug via INTRAVENOUS

## 2014-07-03 MED ORDER — SODIUM CHLORIDE 0.9 % IJ SOLN: 3.0000 mL | INTRAMUSCULAR | Status: DC | PRN

## 2014-07-03 MED ORDER — ASPIRIN 81 MG PO CHEW: 81.0000 mg | CHEWABLE_TABLET | ORAL | Status: AC

## 2014-07-03 MED ORDER — TICAGRELOR 90 MG PO TABS
180.0000 mg | ORAL_TABLET | Freq: Once | ORAL | Status: AC
  Administered 2014-07-03: 180 mg via ORAL

## 2014-07-03 MED ORDER — FENTANYL CITRATE (PF) 100 MCG/2ML IJ SOLN
INTRAMUSCULAR | Status: AC
  Filled 2014-07-03: qty 2

## 2014-07-03 MED ORDER — NITROGLYCERIN 0.4 MG SL SUBL
0.4000 mg | SUBLINGUAL_TABLET | SUBLINGUAL | Status: DC | PRN
  Administered 2014-07-03: 0.4 mg via SUBLINGUAL

## 2014-07-03 MED ORDER — LIDOCAINE HCL (PF) 1 % IJ SOLN
INTRAMUSCULAR | Status: AC
  Filled 2014-07-03: qty 30

## 2014-07-03 MED ORDER — LISINOPRIL 40 MG PO TABS
40.0000 mg | ORAL_TABLET | Freq: Every day | ORAL | Status: DC
  Administered 2014-07-04: 10:00:00 40 mg via ORAL
  Filled 2014-07-03: qty 1

## 2014-07-03 MED ORDER — SODIUM CHLORIDE 0.9 % IJ SOLN: 3.0000 mL | Freq: Two times a day (BID) | INTRAMUSCULAR | Status: DC

## 2014-07-03 MED ORDER — SODIUM CHLORIDE 0.9 % IJ SOLN
3.0000 mL | INTRAMUSCULAR | Status: DC | PRN
Start: 2014-07-03 — End: 2014-07-03

## 2014-07-03 MED ORDER — TICAGRELOR 90 MG PO TABS
ORAL_TABLET | ORAL | Status: DC | PRN
  Administered 2014-07-03: 180 mg via ORAL

## 2014-07-03 MED ORDER — SODIUM CHLORIDE 0.9 % WEIGHT BASED INFUSION
3.0000 mL/kg/h | INTRAVENOUS | Status: DC
  Administered 2014-07-03: 3 mL/kg/h via INTRAVENOUS

## 2014-07-03 MED ORDER — SODIUM CHLORIDE 0.9 % IJ SOLN
INTRAMUSCULAR | Status: DC | PRN
  Administered 2014-07-03: 18:00:00 via INTRA_ARTERIAL

## 2014-07-03 MED ORDER — HEPARIN SODIUM (PORCINE) 5000 UNIT/ML IJ SOLN: 5000.0000 [IU] | Freq: Three times a day (TID) | INTRAMUSCULAR | Status: DC

## 2014-07-03 MED ORDER — HEPARIN (PORCINE) IN NACL 2-0.9 UNIT/ML-% IJ SOLN
INTRAMUSCULAR | Status: AC
  Filled 2014-07-03: qty 1000

## 2014-07-03 MED ORDER — METOPROLOL TARTRATE 25 MG PO TABS
50.0000 mg | ORAL_TABLET | Freq: Two times a day (BID) | ORAL | Status: DC
  Administered 2014-07-03 – 2014-07-04 (×2): 50 mg via ORAL
  Filled 2014-07-03 (×2): qty 2

## 2014-07-03 MED ORDER — ASPIRIN EC 81 MG PO TBEC
81.0000 mg | DELAYED_RELEASE_TABLET | Freq: Every day | ORAL | Status: DC
  Administered 2014-07-04: 10:00:00 81 mg via ORAL
  Filled 2014-07-03: qty 1

## 2014-07-03 MED ORDER — AMLODIPINE BESYLATE 10 MG PO TABS
10.0000 mg | ORAL_TABLET | Freq: Every day | ORAL | Status: DC
  Administered 2014-07-04: 10 mg via ORAL
  Filled 2014-07-03: qty 1

## 2014-07-03 MED ORDER — IOHEXOL 350 MG/ML SOLN
INTRAVENOUS | Status: DC | PRN
Start: 2014-07-03 — End: 2014-07-03
  Administered 2014-07-03: 100 mL via INTRAVENOUS
  Administered 2014-07-03: 100 mL via INTRA_ARTERIAL

## 2014-07-03 MED ORDER — MIDAZOLAM HCL 2 MG/2ML IJ SOLN
INTRAMUSCULAR | Status: DC | PRN
  Administered 2014-07-03: 1 mg via INTRAVENOUS
  Administered 2014-07-03: 2 mg via INTRAVENOUS

## 2014-07-03 MED ORDER — ACETAMINOPHEN 325 MG PO TABS: 650.0000 mg | ORAL_TABLET | ORAL | Status: DC | PRN

## 2014-07-03 MED ORDER — TICAGRELOR 90 MG PO TABS
90.0000 mg | ORAL_TABLET | Freq: Two times a day (BID) | ORAL | Status: DC
  Administered 2014-07-04 (×2): 90 mg via ORAL
  Filled 2014-07-03 (×3): qty 1

## 2014-07-03 MED ORDER — HEPARIN SODIUM (PORCINE) 1000 UNIT/ML IJ SOLN
INTRAMUSCULAR | Status: DC | PRN
  Administered 2014-07-03: 6000 [IU] via INTRAVENOUS

## 2014-07-03 MED ORDER — ASPIRIN 81 MG PO TABS: 81.0000 mg | ORAL_TABLET | Freq: Every day | ORAL | Status: DC

## 2014-07-03 MED ORDER — NITROGLYCERIN 1 MG/10 ML FOR IR/CATH LAB
INTRA_ARTERIAL | Status: AC
  Filled 2014-07-03: qty 10

## 2014-07-03 MED ORDER — SODIUM CHLORIDE 0.9 % WEIGHT BASED INFUSION
3.0000 mL/kg/h | INTRAVENOUS | Status: AC
  Administered 2014-07-03: 20:00:00 3 mL/kg/h via INTRAVENOUS

## 2014-07-03 SURGICAL SUPPLY — 21 items
BALLN EUPHORA RX 2.25X15 (BALLOONS) ×2
BALLN ~~LOC~~ EUPHORA RX 2.75X12 (BALLOONS) ×2
BALLOON EUPHORA RX 2.25X15 (BALLOONS) IMPLANT
BALLOON ~~LOC~~ EUPHORA RX 2.75X12 (BALLOONS) IMPLANT
CATH INFINITI 5 FR JL3.5 (CATHETERS) ×2 IMPLANT
CATH INFINITI 5FR AL1 (CATHETERS) ×1 IMPLANT
CATH INFINITI 5FR ANG PIGTAIL (CATHETERS) ×2 IMPLANT
CATH INFINITI JR4 5F (CATHETERS) ×2 IMPLANT
CATH SITESEER 5F NTR (CATHETERS) ×1 IMPLANT
CATH VISTA GUIDE 6FR XBLAD3.5 (CATHETERS) ×1 IMPLANT
DEVICE RAD COMP TR BAND LRG (VASCULAR PRODUCTS) ×2 IMPLANT
GLIDESHEATH SLEND SS 6F .021 (SHEATH) ×2 IMPLANT
KIT ENCORE 26 ADVANTAGE (KITS) ×1 IMPLANT
KIT HEART LEFT (KITS) ×2 IMPLANT
PACK CARDIAC CATHETERIZATION (CUSTOM PROCEDURE TRAY) ×2 IMPLANT
STENT PROMUS PREM MR 2.5X16 (Permanent Stent) ×1 IMPLANT
SYR MEDRAD MARK V 150ML (SYRINGE) ×2 IMPLANT
TRANSDUCER W/STOPCOCK (MISCELLANEOUS) ×2 IMPLANT
TUBING CIL FLEX 10 FLL-RA (TUBING) ×2 IMPLANT
WIRE ASAHI SOFT 180CM (WIRE) ×1 IMPLANT
WIRE SAFE-T 1.5MM-J .035X260CM (WIRE) ×2 IMPLANT

## 2014-07-03 NOTE — ED Provider Notes (Signed)
CSN: [id number]     Arrival date & time 07/03/14  1207 History   First MD Initiated Contact with Patient 07/03/14 1211     Chief Complaint  Patient presents with  . Chest Pain     (Consider location/radiation/quality/duration/timing/severity/associated sxs/prior Treatment) HPI   Pt with hx CAD with two stents placed, MI, HTN, HLD, obesity, GERD p/w left arm pain, left chest pain, left face tingling.  The symptoms began yesterday with aching pain in the left arm, it has been constant since that point.  This morning the left sided of his chest started aching and his left face became numb.  No exacerbating symptoms.  Symptoms somewhat improved with home nitroglycerin.  Denies SOB, nausea, diaphoresis, lightheadedness, abdominal pain, change in baseline bilateral leg swelling.  States this is a milder version of what his MIs felt like.  When he had his MIs they always started with left arm pain, progressing to left chest pain.    Cardiologist Dr Percival Spanish.      Past Medical History  Diagnosis Date  . Overweight(278.02)   . Hypertension   . Hyperlipidemia, mixed   . CAD in native artery     Single-vessel CAD with stent placement to the circumflex coronary artery in 2008,  DES to Diag 10/12  . GERD (gastroesophageal reflux disease)   . Tobacco abuse   . Sleep apnea     CPAP   Past Surgical History  Procedure Laterality Date  . Coronary stent placement  2008, 2012   Family History  Problem Relation Age of Onset  . Coronary artery disease Other    History  Substance Use Topics  . Smoking status: Current Every Day Smoker    Start date: 03/20/2012  . Smokeless tobacco: Not on file  . Alcohol Use: 0.0 oz/week    0 Standard drinks or equivalent per week    Review of Systems  All other systems reviewed and are negative.     Allergies  Review of patient's allergies indicates no known allergies.  Home Medications   Prior to Admission medications   Medication Sig Start Date  End Date Taking? Authorizing Provider  amLODipine (NORVASC) 10 MG tablet Take 1 tablet (10 mg total) by mouth daily. 05/13/14   Minus Breeding, MD  aspirin 81 MG tablet Take 81 mg by mouth daily.      Historical Provider, MD  atorvastatin (LIPITOR) 40 MG tablet TAKE ONE TABLET BY MOUTH ONCE DAILY 04/27/14   Minus Breeding, MD  hydrochlorothiazide (MICROZIDE) 12.5 MG capsule Take 1 capsule (12.5 mg total) by mouth daily. 04/27/14   Minus Breeding, MD  lisinopril (PRINIVIL,ZESTRIL) 40 MG tablet Take 1 tablet (40 mg total) by mouth daily. 06/16/14   Minus Breeding, MD  metoprolol (LOPRESSOR) 50 MG tablet Take 1 tablet (50 mg total) by mouth 2 (two) times daily. 04/27/14   Minus Breeding, MD  nitroGLYCERIN (NITROSTAT) 0.4 MG SL tablet Place 1 tablet (0.4 mg total) under the tongue every 5 (five) minutes as needed. May repeat for up to 3 doses. 05/13/14   Minus Breeding, MD  sildenafil (VIAGRA) 50 MG tablet Take 1 tablet (50 mg total) by mouth daily as needed for erectile dysfunction. 05/13/14   Minus Breeding, MD  varenicline (CHANTIX STARTING MONTH PAK) 0.5 MG X 11 & 1 MG X 42 tablet Take 0.5 mg tab by mouth QD for 3 days, then take 0.5 mg tab twice daily for 4 days, then increase to 1 mg tab twice daily. 05/13/14  Minus Breeding, MD  varenicline (CHANTIX) 1 MG tablet Take 1 tablet (1 mg total) by mouth 2 (two) times daily. 05/13/14   Minus Breeding, MD   BP 130/85 mmHg  Pulse 76  Temp(Src) 98 F (36.7 C) (Oral)  Resp 15  Ht 6' (1.829 m)  Wt 270 lb (122.471 kg)  BMI 36.61 kg/m2  SpO2 98% Physical Exam  Constitutional: He appears well-developed and well-nourished. No distress.  HENT:  Head: Normocephalic and atraumatic.  Neck: Neck supple.  Cardiovascular: Normal rate and regular rhythm.   Pulmonary/Chest: Effort normal and breath sounds normal. No respiratory distress. He has no wheezes. He has no rales.  Abdominal: Soft. He exhibits no distension and no mass. There is no tenderness. There is no  rebound and no guarding.  obese  Musculoskeletal: He exhibits no edema.  Neurological: He is alert. He exhibits normal muscle tone.  Skin: He is not diaphoretic.  Nursing note and vitals reviewed.   ED Course  Procedures (including critical care time) Labs Review Labs Reviewed  CBC WITH DIFFERENTIAL/PLATELET - Abnormal; Notable for the following:    Eosinophils Relative 7 (*)    All other components within normal limits  TROPONIN I  BRAIN NATRIURETIC PEPTIDE  BASIC METABOLIC PANEL  I-STAT TROPOININ, ED    Imaging Review No results found.   EKG Interpretation   Date/Time:  Friday Jul 03 2014 12:09:35 EDT Ventricular Rate:  77 PR Interval:  175 QRS Duration: 87 QT Interval:  367 QTC Calculation: 415 R Axis:   51 Text Interpretation:  Sinus rhythm Borderline T wave abnormalities  Baseline wander in lead(s) V1 No significant change since last tracing  Confirmed by Mingo Amber  MD, Lindsay ([id number]) on 07/03/2014 12:21:26 PM       1:16 PM I spoke with Trish, cardmaster.  Cardiology will see patient in consult.    MDM   Final diagnoses:  Chest pain   Afebrile nontoxic patient with CAD s/p STEMI and two stents placed with obesity, HTN, HLD p/w left sided chest pain and left arm pain.  Initial testing is negative, no EKG changes, first troponin negative, labs unremarkable.  Admitted to cardiology for further evaluation and treatment.     Clayton Bibles, PA-C 07/03/14 1508  Evelina Bucy, MD 07/03/14 1515

## 2014-07-03 NOTE — ED Notes (Signed)
Pt has been having chest pain since mid morning yesterday, Went to [facility] for further eval.  PA called EMS to bring him to hospital. Pt took 1 nitro before going to urgent care and was given 324mg  of ASA while at urgent care. VS are as follows: BP:169/51 HR:82 Resp:22 O2sat:98% on 3L

## 2014-07-03 NOTE — Interval H&P Note (Signed)
History and Physical Interval Note:  07/03/2014 6:21 PM  Scott Hatfield  has presented today for surgery, with the diagnosis of cp  The various methods of treatment have been discussed with the patient and family. After consideration of risks, benefits and other options for treatment, the patient has consented to  Procedure(s): Left Heart Cath and Coronary Angiography (N/A) as a surgical intervention .  The patient's history has been reviewed, patient examined, no change in status, stable for surgery.  I have reviewed the patient's chart and labs.  Questions were answered to the patient's satisfaction.   Cath Lab Visit (complete for each Cath Lab visit)  Clinical Evaluation Leading to the Procedure:   ACS: Yes.    Non-ACS:    Anginal Classification: CCS III  Anti-ischemic medical therapy: Maximal Therapy (2 or more classes of medications)  Non-Invasive Test Results: No non-invasive testing performed  Prior CABG: No previous CABG        [name] 07/03/2014 6:21 PM

## 2014-07-03 NOTE — ED Notes (Signed)
Appointment time 16:00p

## 2014-07-03 NOTE — Progress Notes (Signed)
Patient ID: Scott Hatfield MRN: [id number], DOB/AGE: 54-02-1960   Admit date: 07/03/2014   Primary Physician: Octavio Graves, DO Primary Cardiologist: Dr. Percival Spanish  Pt. Profile:  Scott Hatfield is a 54 y.o. male with a history of CAD s/p stenting to LCx (2008), STEMI s/p DES to [place] (2012), OSA on CPAP, tobacco abuse (quit recently), HTN, HLD, GERD and obesity who presented to [facility] ED today with chest pain.   He had a history of circumflex stenting in another town in 2008. He presented urgently in 10/12 with unstable angina and ST segment elevation and was found to have an occluded diagonal. He had a drug-eluting stent placed. He had 50% LAD stenosis and 30% circumflex in-stent stenosis that was managed medically.  DATE OF PROCEDURE:  12/03/2010   STUDY CONCLUSIONS: 1. Acute lateral ST-elevation myocardial infarction due to occlusion     of the second diagonal branch of the LAD. 2. Patent stent in the left circumflex. 3. Moderate disease in the mid left anterior descending artery. 4. Mildly elevated left ventricular end-diastolic pressure and normal     LV systolic function. 5. Successful angioplasty and drug-eluting stent placement to the     second diagonal with a 2.5 x 20 mm PROMUS Element drug-eluting     stent.  There was residual disease at the ostium, which was treated     with balloon angioplasty alone without stent placement. The     location is not ideal for placing another stent due to the ostial     location and angulation with a left anterior descending artery.  He was seen by Dr. Percival Spanish 05/13/14 and felt to be doing well from a cardiac standpoint. His BP was elevated and his amlodipine was increased. He was also started on chantix for smoking cessation.   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 It feels like a "toothache". This then progressed into left sided chest  burning and some left facial numbness. These symptoms are similar in character to his cardiac chest pain prior to his previous interventions; however, less severe. He denies radiation, SOB, orthopnea, PND. He has had some slight LE edema on increased amlodipine but not bothersome. He woke up this morning with some chest discomfort. This is now resolved. He still has some left arm pain. While in the room with the patient he received 1 SL NTG which did help alleviate his arm pain. He has been compliant with all his medications and has quit smoking since starting chantix.  Problem List  Past Medical History  Diagnosis Date  . Overweight(278.02)   . Hypertension   . Hyperlipidemia, mixed   . CAD in native artery     Single-vessel CAD with stent placement to the circumflex coronary artery in 2008,  DES to Diag 10/12  . GERD (gastroesophageal reflux disease)   . Tobacco abuse   . Sleep apnea     CPAP    Past Surgical History  Procedure Laterality Date  . Coronary stent placement  2008, 2012     Allergies  No Known Allergies   Home Medications  Prior to Admission medications   Medication Sig Start Date End Date Taking? Authorizing Provider  amLODipine (NORVASC) 10 MG tablet Take 1 tablet (10 mg total) by mouth daily. 05/13/14  Yes Minus Breeding, MD  aspirin 81 MG tablet Take 81 mg by mouth  daily.     Yes Historical Provider, MD  atorvastatin (LIPITOR) 40 MG tablet TAKE ONE TABLET BY MOUTH ONCE DAILY Patient taking differently: Take 40 mg by mouth daily.  04/27/14  Yes Minus Breeding, MD  hydrochlorothiazide (MICROZIDE) 12.5 MG capsule Take 1 capsule (12.5 mg total) by mouth daily. 04/27/14  Yes Minus Breeding, MD  lisinopril (PRINIVIL,ZESTRIL) 40 MG tablet Take 1 tablet (40 mg total) by mouth daily. 06/16/14  Yes Minus Breeding, MD  metoprolol (LOPRESSOR) 50 MG tablet Take 1 tablet (50 mg total) by mouth 2 (two) times daily. 04/27/14  Yes Minus Breeding, MD  nitroGLYCERIN (NITROSTAT) 0.4 MG  SL tablet Place 1 tablet (0.4 mg total) under the tongue every 5 (five) minutes as needed. May repeat for up to 3 doses. 05/13/14  Yes Minus Breeding, MD  varenicline (CHANTIX) 1 MG tablet Take 1 tablet (1 mg total) by mouth 2 (two) times daily. 05/13/14  Yes Minus Breeding, MD  sildenafil (VIAGRA) 50 MG tablet Take 1 tablet (50 mg total) by mouth daily as needed for erectile dysfunction. 05/13/14   Minus Breeding, MD  varenicline (CHANTIX STARTING MONTH PAK) 0.5 MG X 11 & 1 MG X 42 tablet Take 0.5 mg tab by mouth QD for 3 days, then take 0.5 mg tab twice daily for 4 days, then increase to 1 mg tab twice daily. Patient not taking: Reported on 07/03/2014 05/13/14   Minus Breeding, MD    Family History  Family History  Problem Relation Age of Onset  . Coronary artery disease Other    No family status information on file.     Social History  History   Social History  . Marital Status: Married    Spouse Name: N/A  . Number of Children: N/A  . Years of Education: N/A   Occupational History  . Full time    Social History Main Topics  . Smoking status: Current Every Day Smoker    Start date: 03/20/2012  . Smokeless tobacco: Not on file  . Alcohol Use: 0.0 oz/week    0 Standard drinks or equivalent per week  . Drug Use: No  . Sexual Activity: Not on file   Other Topics Concern  . Not on file   Social History Narrative   Married   No regular exercise      All other systems reviewed and are otherwise negative except as noted above.  Physical Exam  Blood pressure 130/85, pulse 76, temperature 98 F (36.7 C), temperature source Oral, resp. rate 15, height 6' (1.829 m), weight 270 lb (122.471 kg), SpO2 98 %.  General: Pleasant, NAD. overweight Psych: Normal affect. Neuro: Alert and oriented X 3. Moves all extremities spontaneously. HEENT: Normal  Neck: Supple without bruits or JVD. Lungs:  Resp regular and unlabored, CTA. Heart: RRR no s3, s4, or murmurs. Abdomen: Soft,  non-tender, non-distended, BS + x 4.  Extremities: No clubbing, cyanosis or edema. DP/PT/Radials 2+ and equal bilaterally.  Labs  No results for input(s): CKTOTAL, CKMB, TROPONINI in the last 72 hours. Lab Results  Component Value Date   WBC 7.3 07/03/2014   HGB 15.9 07/03/2014   HCT 47.8 07/03/2014   MCV 94.3 07/03/2014   PLT 260 07/03/2014     Radiology/Studies  Dg Chest Port 1 View  07/03/2014   CLINICAL DATA:  Mid chest pain  EXAM: PORTABLE CHEST - 1 VIEW  COMPARISON:  None.  FINDINGS: Mild cardiomegaly is noted. Mild vascular congestion is seen with mild interstitial  edema. No focal confluent infiltrate is noted. No bony abnormality is seen.  IMPRESSION: Mild CHF.   Electronically Signed   By: Inez Catalina M.D.   On: 07/03/2014 13:27    ECG  HR 77 NSR. No acute ST or TW changes  ASSESSMENT AND PLAN  Scott Hatfield is a 54 y.o. male with a history of CAD s/p stenting to LCx (2008), DES to Diag (2012), OSA on CPAP, tobacco abuse, HTN, HLD, GERD and obesity who presented to [facility] ED today with chest pain.   Chest pain -- Troponin x1 negative, ECG with no acute ST or TW changes -- Will admit to telemtry for observation -- Cycle Troponin and serial ECGs -- Continue ASA, statin, BB  -- CXR with pulmonary edema but BNP normal, no SOB and no physical exam findings suggestive of acute CHF. -- Will add on cath board for today. Remain NPO   HTN- well controlled. Continue amlodipine 10mg , HCTZ 12.5mg  qd and lopressor 50mg  BID.  HLD- continue statin  Tobacco abuse- continue chantix.  Judy Pimple, PA-C 07/03/2014, 1:32 PM  Pager [phone]  Patient seen and examined with Nell Range, PA-C. We discussed all aspects of the encounter. I agree with the assessment and plan as stated above.  CP with typical and atypical features but reminiscent of previous angina. We discussed risks/indications of stress testing vs cath. He would like to proceed with cath.   I  have reviewed the risks, indications, and alternatives to angioplasty and stenting with the patient. Risks include but are not limited to bleeding, infection, vascular injury, stroke, myocardial infection, arrhythmia, kidney injury, radiation-related injury in the case of prolonged fluoroscopy use, emergency cardiac surgery, and death. The patient understands the risks of serious complication is low (<9%) and he agrees to proceed.   Jaeliana Lococo,MD 3:03 PM

## 2014-07-03 NOTE — H&P (View-Only) (Signed)
Patient ID: AB LEAMING MRN: [id number], DOB/AGE: 1961-01-11   Admit date: 07/03/2014   Primary Physician: Octavio Graves, DO Primary Cardiologist: Dr. Percival Spanish  Pt. Profile:  Scott Hatfield is a 54 y.o. male with a history of CAD s/p stenting to LCx (2008), STEMI s/p DES to [place] (2012), OSA on CPAP, tobacco abuse (quit recently), HTN, HLD, GERD and obesity who presented to [facility] ED today with chest pain.   He had a history of circumflex stenting in another town in 2008. He presented urgently in 10/12 with unstable angina and ST segment elevation and was found to have an occluded diagonal. He had a drug-eluting stent placed. He had 50% LAD stenosis and 30% circumflex in-stent stenosis that was managed medically.  DATE OF PROCEDURE:  12/03/2010   STUDY CONCLUSIONS: 1. Acute lateral ST-elevation myocardial infarction due to occlusion     of the second diagonal branch of the LAD. 2. Patent stent in the left circumflex. 3. Moderate disease in the mid left anterior descending artery. 4. Mildly elevated left ventricular end-diastolic pressure and normal     LV systolic function. 5. Successful angioplasty and drug-eluting stent placement to the     second diagonal with a 2.5 x 20 mm PROMUS Element drug-eluting     stent.  There was residual disease at the ostium, which was treated     with balloon angioplasty alone without stent placement. The     location is not ideal for placing another stent due to the ostial     location and angulation with a left anterior descending artery.  He was seen by Dr. Percival Spanish 05/13/14 and felt to be doing well from a cardiac standpoint. His BP was elevated and his amlodipine was increased. He was also started on chantix for smoking cessation.   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 It feels like a "toothache". This then progressed into left sided chest  burning and some left facial numbness. These symptoms are similar in character to his cardiac chest pain prior to his previous interventions; however, less severe. He denies radiation, SOB, orthopnea, PND. He has had some slight LE edema on increased amlodipine but not bothersome. He woke up this morning with some chest discomfort. This is now resolved. He still has some left arm pain. While in the room with the patient he received 1 SL NTG which did help alleviate his arm pain. He has been compliant with all his medications and has quit smoking since starting chantix.  Problem List  Past Medical History  Diagnosis Date  . Overweight(278.02)   . Hypertension   . Hyperlipidemia, mixed   . CAD in native artery     Single-vessel CAD with stent placement to the circumflex coronary artery in 2008,  DES to Diag 10/12  . GERD (gastroesophageal reflux disease)   . Tobacco abuse   . Sleep apnea     CPAP    Past Surgical History  Procedure Laterality Date  . Coronary stent placement  2008, 2012     Allergies  No Known Allergies   Home Medications  Prior to Admission medications   Medication Sig Start Date End Date Taking? Authorizing Provider  amLODipine (NORVASC) 10 MG tablet Take 1 tablet (10 mg total) by mouth daily. 05/13/14  Yes Minus Breeding, MD  aspirin 81 MG tablet Take 81 mg by mouth  daily.     Yes Historical Provider, MD  atorvastatin (LIPITOR) 40 MG tablet TAKE ONE TABLET BY MOUTH ONCE DAILY Patient taking differently: Take 40 mg by mouth daily.  04/27/14  Yes Minus Breeding, MD  hydrochlorothiazide (MICROZIDE) 12.5 MG capsule Take 1 capsule (12.5 mg total) by mouth daily. 04/27/14  Yes Minus Breeding, MD  lisinopril (PRINIVIL,ZESTRIL) 40 MG tablet Take 1 tablet (40 mg total) by mouth daily. 06/16/14  Yes Minus Breeding, MD  metoprolol (LOPRESSOR) 50 MG tablet Take 1 tablet (50 mg total) by mouth 2 (two) times daily. 04/27/14  Yes Minus Breeding, MD  nitroGLYCERIN (NITROSTAT) 0.4 MG  SL tablet Place 1 tablet (0.4 mg total) under the tongue every 5 (five) minutes as needed. May repeat for up to 3 doses. 05/13/14  Yes Minus Breeding, MD  varenicline (CHANTIX) 1 MG tablet Take 1 tablet (1 mg total) by mouth 2 (two) times daily. 05/13/14  Yes Minus Breeding, MD  sildenafil (VIAGRA) 50 MG tablet Take 1 tablet (50 mg total) by mouth daily as needed for erectile dysfunction. 05/13/14   Minus Breeding, MD  varenicline (CHANTIX STARTING MONTH PAK) 0.5 MG X 11 & 1 MG X 42 tablet Take 0.5 mg tab by mouth QD for 3 days, then take 0.5 mg tab twice daily for 4 days, then increase to 1 mg tab twice daily. Patient not taking: Reported on 07/03/2014 05/13/14   Minus Breeding, MD    Family History  Family History  Problem Relation Age of Onset  . Coronary artery disease Other    No family status information on file.     Social History  History   Social History  . Marital Status: Married    Spouse Name: N/A  . Number of Children: N/A  . Years of Education: N/A   Occupational History  . Full time    Social History Main Topics  . Smoking status: Current Every Day Smoker    Start date: 03/20/2012  . Smokeless tobacco: Not on file  . Alcohol Use: 0.0 oz/week    0 Standard drinks or equivalent per week  . Drug Use: No  . Sexual Activity: Not on file   Other Topics Concern  . Not on file   Social History Narrative   Married   No regular exercise      All other systems reviewed and are otherwise negative except as noted above.  Physical Exam  Blood pressure 130/85, pulse 76, temperature 98 F (36.7 C), temperature source Oral, resp. rate 15, height 6' (1.829 m), weight 270 lb (122.471 kg), SpO2 98 %.  General: Pleasant, NAD. overweight Psych: Normal affect. Neuro: Alert and oriented X 3. Moves all extremities spontaneously. HEENT: Normal  Neck: Supple without bruits or JVD. Lungs:  Resp regular and unlabored, CTA. Heart: RRR no s3, s4, or murmurs. Abdomen: Soft,  non-tender, non-distended, BS + x 4.  Extremities: No clubbing, cyanosis or edema. DP/PT/Radials 2+ and equal bilaterally.  Labs  No results for input(s): CKTOTAL, CKMB, TROPONINI in the last 72 hours. Lab Results  Component Value Date   WBC 7.3 07/03/2014   HGB 15.9 07/03/2014   HCT 47.8 07/03/2014   MCV 94.3 07/03/2014   PLT 260 07/03/2014     Radiology/Studies  Dg Chest Port 1 View  07/03/2014   CLINICAL DATA:  Mid chest pain  EXAM: PORTABLE CHEST - 1 VIEW  COMPARISON:  None.  FINDINGS: Mild cardiomegaly is noted. Mild vascular congestion is seen with mild interstitial  edema. No focal confluent infiltrate is noted. No bony abnormality is seen.  IMPRESSION: Mild CHF.   Electronically Signed   By: Inez Catalina M.D.   On: 07/03/2014 13:27    ECG  HR 77 NSR. No acute ST or TW changes  ASSESSMENT AND PLAN  GILMER KAMINSKY is a 54 y.o. male with a history of CAD s/p stenting to LCx (2008), DES to Diag (2012), OSA on CPAP, tobacco abuse, HTN, HLD, GERD and obesity who presented to [facility] ED today with chest pain.   Chest pain -- Troponin x1 negative, ECG with no acute ST or TW changes -- Will admit to telemtry for observation -- Cycle Troponin and serial ECGs -- Continue ASA, statin, BB  -- CXR with pulmonary edema but BNP normal, no SOB and no physical exam findings suggestive of acute CHF. -- Will add on cath board for today. Remain NPO   HTN- well controlled. Continue amlodipine 10mg , HCTZ 12.5mg  qd and lopressor 50mg  BID.  HLD- continue statin  Tobacco abuse- continue chantix.  Judy Pimple, PA-C 07/03/2014, 1:32 PM  Pager [phone]  Patient seen and examined with Nell Range, PA-C. We discussed all aspects of the encounter. I agree with the assessment and plan as stated above.  CP with typical and atypical features but reminiscent of previous angina. We discussed risks/indications of stress testing vs cath. He would like to proceed with cath.   I  have reviewed the risks, indications, and alternatives to angioplasty and stenting with the patient. Risks include but are not limited to bleeding, infection, vascular injury, stroke, myocardial infection, arrhythmia, kidney injury, radiation-related injury in the case of prolonged fluoroscopy use, emergency cardiac surgery, and death. The patient understands the risks of serious complication is low (<6%) and he agrees to proceed.   Bensimhon, Daniel,MD 3:03 PM

## 2014-07-04 DIAGNOSIS — I2510 Atherosclerotic heart disease of native coronary artery without angina pectoris: Secondary | ICD-10-CM

## 2014-07-04 DIAGNOSIS — G4730 Sleep apnea, unspecified: Secondary | ICD-10-CM | POA: Diagnosis present

## 2014-07-04 DIAGNOSIS — I200 Unstable angina: Secondary | ICD-10-CM

## 2014-07-04 DIAGNOSIS — I249 Acute ischemic heart disease, unspecified: Secondary | ICD-10-CM | POA: Diagnosis present

## 2014-07-04 DIAGNOSIS — Z9861 Coronary angioplasty status: Secondary | ICD-10-CM

## 2014-07-04 LAB — COMPREHENSIVE METABOLIC PANEL
ALK PHOS: 59 U/L (ref 38–126)
ALT: 22 U/L (ref 17–63)
ANION GAP: 7 (ref 5–15)
AST: 15 U/L (ref 15–41)
Albumin: 3.4 g/dL — ABNORMAL LOW (ref 3.5–5.0)
BUN: 10 mg/dL (ref 6–20)
CALCIUM: 8.5 mg/dL — AB (ref 8.9–10.3)
CO2: 25 mmol/L (ref 22–32)
Chloride: 106 mmol/L (ref 101–111)
Creatinine, Ser: 0.84 mg/dL (ref 0.61–1.24)
GFR calc Af Amer: >60 mL/min (ref >60)
Glucose, Bld: 92 mg/dL (ref 65–99)
POTASSIUM: 3.6 mmol/L (ref 3.5–5.1)
Sodium: 138 mmol/L (ref 135–145)
TOTAL PROTEIN: 6.3 g/dL — AB (ref 6.5–8.1)
Total Bilirubin: 0.8 mg/dL (ref 0.3–1.2)

## 2014-07-04 LAB — CBC
HCT: 46.5 % (ref 39.0–52.0)
Hemoglobin: 15.6 g/dL (ref 13.0–17.0)
MCH: 31.8 pg (ref 26.0–34.0)
MCHC: 33.5 g/dL (ref 30.0–36.0)
MCV: 94.7 fL (ref 78.0–100.0)
PLATELETS: 249 10*3/uL (ref 150–400)
RBC: 4.91 MIL/uL (ref 4.22–5.81)
RDW: 12.7 % (ref 11.5–15.5)
WBC: 8.5 10*3/uL (ref 4.0–10.5)

## 2014-07-04 LAB — TROPONIN I: Troponin I: <0.03 ng/mL (ref <0.031)

## 2014-07-04 LAB — LIPID PANEL
CHOL/HDL RATIO: 4.3 ratio
Cholesterol: 128 mg/dL (ref 0–200)
HDL: 30 mg/dL — ABNORMAL LOW (ref >40)
LDL Cholesterol: 73 mg/dL (ref 0–99)
TRIGLYCERIDES: 127 mg/dL (ref <150)
VLDL: 25 mg/dL (ref 0–40)

## 2014-07-04 LAB — PROTIME-INR
INR: 1.07 (ref 0.00–1.49)
PROTHROMBIN TIME: 14.1 s (ref 11.6–15.2)

## 2014-07-04 MED ORDER — SILDENAFIL CITRATE 50 MG PO TABS: 50.0000 mg | ORAL_TABLET | Freq: Every day | ORAL | Status: DC | PRN

## 2014-07-04 MED ORDER — HYDROCHLOROTHIAZIDE 12.5 MG PO CAPS: 25.0000 mg | ORAL_CAPSULE | Freq: Every day | ORAL | Status: DC

## 2014-07-04 MED ORDER — TICAGRELOR 90 MG PO TABS: 90.0000 mg | ORAL_TABLET | Freq: Two times a day (BID) | ORAL | Status: DC

## 2014-07-04 NOTE — Discharge Summary (Signed)
Patient ID: Scott Hatfield,  MRN: [id number], DOB/AGE: 1960/07/02 54 y.o.  Admit date: 07/03/2014 Discharge date: 07/04/2014  Primary Care Provider: Octavio Graves, DO Primary Cardiologist: Dr Percival Spanish  Discharge Diagnoses Principal Problem:   Unstable angina Active Problems:   CAD- S/P PCI '08, 2012, Dx DES 07/03/14   Essential hypertension   Dyslipidemia   Sleep apnea-on C-pap   Acute coronary syndrome    Procedures: Cath/ Dx DES 07/03/14   Hospital Course:  54 y.o. male with a history of CAD s/p stenting to LCx (2008), STEMI s/p DES to [place] (2012), OSA on CPAP, tobacco abuse (quit recently), HTN, HLD, GERD and obesity who presented to [facility] ED today with chest pain. He ruled out for an MI. Cathd one 07/03/14 results noted below.   Prox RCA lesion, 25% stenosed.  Prox LAD lesion, 30% stenosed.  Ramus lesion, 45% stenosed.  Dist Cx lesion, 10% stenosed.  1st Diag lesion, 95% stenosed. There is a 0% residual stenosis post intervention. A drug-eluting stent was placed.  A drug-eluting stent was placed.  1, Single vessel obstructive CAD. New stenosis in the first diagonal at distal stent margin. 2. Normal LV function 3. Successful stenting of the first diagonal with a DES  We feel he is stable for discharge 07/04/14. We did increase his HCTZ to 25 mg daily for HTN. He will follow up in [place] with Dr Percival Spanish.     Discharge Vitals:  Blood pressure 150/95, pulse 75, temperature 97.4 F (36.3 C), temperature source Oral, resp. rate 16, height 6' (1.829 m), weight 266 lb 15.6 oz (121.1 kg), SpO2 96 %.  Chest: clear CV: RRR Rt radial site without hematoma  Labs: Results for orders placed or performed during the hospital encounter of 07/03/14 (from the past 24 hour(s))  CBC with Differential     Status: Abnormal   Collection Time: 07/03/14 12:51 PM  Result Value Ref Range   WBC 7.3 4.0 - 10.5 K/uL   RBC 5.07 4.22 - 5.81 MIL/uL   Hemoglobin 15.9 13.0 - 17.0 g/dL    HCT 47.8 39.0 - 52.0 %   MCV 94.3 78.0 - 100.0 fL   MCH 31.4 26.0 - 34.0 pg   MCHC 33.3 30.0 - 36.0 g/dL   RDW 12.6 11.5 - 15.5 %   Platelets 260 150 - 400 K/uL   Neutrophils Relative % 61 43 - 77 %   Neutro Abs 4.5 1.7 - 7.7 K/uL   Lymphocytes Relative 22 12 - 46 %   Lymphs Abs 1.6 0.7 - 4.0 K/uL   Monocytes Relative 9 3 - 12 %   Monocytes Absolute 0.6 0.1 - 1.0 K/uL   Eosinophils Relative 7 (H) 0 - 5 %   Eosinophils Absolute 0.5 0.0 - 0.7 K/uL   Basophils Relative 1 0 - 1 %   Basophils Absolute 0.1 0.0 - 0.1 K/uL  Troponin I     Status: None   Collection Time: 07/03/14 12:51 PM  Result Value Ref Range   Troponin I <0.03 <0.031 ng/mL  Brain natriuretic peptide     Status: None   Collection Time: 07/03/14 12:51 PM  Result Value Ref Range   B Natriuretic Peptide 18.9 0.0 - 100.0 pg/mL  Basic metabolic panel     Status: None   Collection Time: 07/03/14 12:51 PM  Result Value Ref Range   Sodium 139 135 - 145 mmol/L   Potassium 4.2 3.5 - 5.1 mmol/L   Chloride 108 101 -  111 mmol/L   CO2 23 22 - 32 mmol/L   Glucose, Bld 95 65 - 99 mg/dL   BUN 13 6 - 20 mg/dL   Creatinine, Ser 0.89 0.61 - 1.24 mg/dL   Calcium 8.9 8.9 - 10.3 mg/dL   GFR calc non Af Amer >60 >60 mL/min   GFR calc Af Amer >60 >60 mL/min   Anion gap 8 5 - 15  I-stat troponin, ED     Status: None   Collection Time: 07/03/14 12:57 PM  Result Value Ref Range   Troponin i, poc 0.00 0.00 - 0.08 ng/mL   Comment 3          Protime-INR     Status: None   Collection Time: 07/03/14  4:51 PM  Result Value Ref Range   Prothrombin Time 13.9 11.6 - 15.2 seconds   INR 1.05 0.00 - 1.49  TSH     Status: None   Collection Time: 07/03/14  4:51 PM  Result Value Ref Range   TSH 1.532 0.350 - 4.500 uIU/mL  CBC     Status: None   Collection Time: 07/03/14  4:51 PM  Result Value Ref Range   WBC 8.9 4.0 - 10.5 K/uL   RBC 4.92 4.22 - 5.81 MIL/uL   Hemoglobin 15.7 13.0 - 17.0 g/dL   HCT 46.7 39.0 - 52.0 %   MCV 94.9 78.0 -  100.0 fL   MCH 31.9 26.0 - 34.0 pg   MCHC 33.6 30.0 - 36.0 g/dL   RDW 12.6 11.5 - 15.5 %   Platelets 254 150 - 400 K/uL  Creatinine, serum     Status: None   Collection Time: 07/03/14  4:51 PM  Result Value Ref Range   Creatinine, Ser 0.90 0.61 - 1.24 mg/dL   GFR calc non Af Amer >60 >60 mL/min   GFR calc Af Amer >60 >60 mL/min  Troponin I (q 6hr x 3)     Status: None   Collection Time: 07/03/14  4:51 PM  Result Value Ref Range   Troponin I <0.03 <0.031 ng/mL  POCT Activated clotting time     Status: None   Collection Time: 07/03/14  7:07 PM  Result Value Ref Range   Activated Clotting Time 447 seconds  Troponin I (q 6hr x 3)     Status: None   Collection Time: 07/03/14  9:55 PM  Result Value Ref Range   Troponin I <0.03 <0.031 ng/mL  Troponin I (q 6hr x 3)     Status: None   Collection Time: 07/04/14  4:02 AM  Result Value Ref Range   Troponin I <0.03 <0.031 ng/mL  Comprehensive metabolic panel     Status: Abnormal   Collection Time: 07/04/14  4:02 AM  Result Value Ref Range   Sodium 138 135 - 145 mmol/L   Potassium 3.6 3.5 - 5.1 mmol/L   Chloride 106 101 - 111 mmol/L   CO2 25 22 - 32 mmol/L   Glucose, Bld 92 65 - 99 mg/dL   BUN 10 6 - 20 mg/dL   Creatinine, Ser 0.84 0.61 - 1.24 mg/dL   Calcium 8.5 (L) 8.9 - 10.3 mg/dL   Total Protein 6.3 (L) 6.5 - 8.1 g/dL   Albumin 3.4 (L) 3.5 - 5.0 g/dL   AST 15 15 - 41 U/L   ALT 22 17 - 63 U/L   Alkaline Phosphatase 59 38 - 126 U/L   Total Bilirubin 0.8 0.3 - 1.2 mg/dL  GFR calc non Af Amer >60 >60 mL/min   GFR calc Af Amer >60 >60 mL/min   Anion gap 7 5 - 15  Lipid panel     Status: Abnormal   Collection Time: 07/04/14  4:02 AM  Result Value Ref Range   Cholesterol 128 0 - 200 mg/dL   Triglycerides 127 <150 mg/dL   HDL 30 (L) >40 mg/dL   Total CHOL/HDL Ratio 4.3 RATIO   VLDL 25 0 - 40 mg/dL   LDL Cholesterol 73 0 - 99 mg/dL  CBC     Status: None   Collection Time: 07/04/14  4:02 AM  Result Value Ref Range   WBC 8.5  4.0 - 10.5 K/uL   RBC 4.91 4.22 - 5.81 MIL/uL   Hemoglobin 15.6 13.0 - 17.0 g/dL   HCT 46.5 39.0 - 52.0 %   MCV 94.7 78.0 - 100.0 fL   MCH 31.8 26.0 - 34.0 pg   MCHC 33.5 30.0 - 36.0 g/dL   RDW 12.7 11.5 - 15.5 %   Platelets 249 150 - 400 K/uL  Protime-INR     Status: None   Collection Time: 07/04/14  4:02 AM  Result Value Ref Range   Prothrombin Time 14.1 11.6 - 15.2 seconds   INR 1.07 0.00 - 1.49    Disposition:      Follow-up Information    Follow up with Minus Breeding, MD.   Specialty:  Cardiology   Why:  office will call   Contact information:   [place] [id number] [phone]       Discharge Medications:    Medication List    TAKE these medications        amLODipine 10 MG tablet  Commonly known as:  NORVASC  Take 1 tablet (10 mg total) by mouth daily.     aspirin 81 MG tablet  Take 81 mg by mouth daily.     atorvastatin 40 MG tablet  Commonly known as:  LIPITOR  TAKE ONE TABLET BY MOUTH ONCE DAILY     hydrochlorothiazide 12.5 MG capsule  Commonly known as:  MICROZIDE  Take 2 capsules (25 mg total) by mouth daily.     lisinopril 40 MG tablet  Commonly known as:  PRINIVIL,ZESTRIL  Take 1 tablet (40 mg total) by mouth daily.     metoprolol 50 MG tablet  Commonly known as:  LOPRESSOR  Take 1 tablet (50 mg total) by mouth 2 (two) times daily.     nitroGLYCERIN 0.4 MG SL tablet  Commonly known as:  NITROSTAT  Place 1 tablet (0.4 mg total) under the tongue every 5 (five) minutes as needed. May repeat for up to 3 doses.     sildenafil 50 MG tablet  Commonly known as:  VIAGRA  Take 1 tablet (50 mg total) by mouth daily as needed for erectile dysfunction (Do not take if you are taking Nitroglycerin).     ticagrelor 90 MG Tabs tablet  Commonly known as:  BRILINTA  Take 1 tablet (90 mg total) by mouth 2 (two) times daily.     varenicline 0.5 MG X 11 & 1 MG X 42 tablet  Commonly known as:  CHANTIX STARTING MONTH PAK  Take 0.5 mg tab  by mouth QD for 3 days, then take 0.5 mg tab twice daily for 4 days, then increase to 1 mg tab twice daily.     varenicline 1 MG tablet  Commonly known as:  CHANTIX  Take  1 tablet (1 mg total) by mouth 2 (two) times daily.         Duration of Discharge Encounter: Greater than 30 minutes including physician time.  Signed, Kerin Ransom PA-C 07/04/2014 10:41 AM   I have seen, examined the patient, and reviewed the above assessment and plan.  On exam, RRR Changes to above are made where necessary.   DC to home today with follow-up as above.  Co Sign: Thompson Grayer, MD 07/04/2014 10:48 AM

## 2014-07-04 NOTE — Progress Notes (Signed)
TR BAND REMOVAL  LOCATION:    right radial  DEFLATED PER PROTOCOL:    Yes.    TIME BAND OFF / DRESSING APPLIED:    00:00   SITE UPON ARRIVAL:    Level 0  SITE AFTER BAND REMOVAL:    Level 0  REVERSE ALLEN'S TEST:     positive  CIRCULATION SENSATION AND MOVEMENT:    Within Normal Limits   Yes.    COMMENTS:   Pt tolerated removal of TR band without complication, will continue to monitor patient.

## 2014-07-04 NOTE — Discharge Instructions (Signed)
Coronary Angiogram With Stent, Care After °Refer to this sheet in the next few weeks. These instructions provide you with information on caring for yourself after your procedure. Your health care provider may also give you more specific instructions. Your treatment has been planned according to current medical practices, but problems sometimes occur. Call your health care provider if you have any problems or questions after your procedure.  °WHAT TO EXPECT AFTER THE PROCEDURE  °The insertion site may be tender for a few days after your procedure. °HOME CARE INSTRUCTIONS  °· Take medicines only as directed by your health care provider. Blood thinners may be prescribed after your procedure to improve blood flow through the stent. °· Change any bandages (dressings) as directed by your health care provider.   °· Check your insertion site every day for redness, swelling, or fluid leaking from the insertion.   °· Do not take baths, swim, or use a hot tub until your health care provider approves. You may shower. Pat the insertion area dry. Do not rub the insertion area with a washcloth or towel.   °· Eat a heart-healthy diet. This should include plenty of fresh fruits and vegetables. Meat should be lean cuts. Avoid the following types of food:   °¨ Food that is high in salt.   °¨ Canned or highly processed food.   °¨ Food that is high in saturated fat or sugar.   °¨ Fried food.   °· Make any other lifestyle changes recommended by your health care provider. This may include:   °¨ Not using any tobacco products including cigarettes, chewing tobacco, or electronic cigarettes.  °¨ Managing your weight.   °¨ Getting regular exercise.   °¨ Managing your blood pressure.   °¨ Limiting your alcohol intake.   °¨ Managing other health problems, such as diabetes.   °· If you need an MRI after your heart stent was placed, be sure to tell the health care provider who orders the MRI that you have a heart stent.   °· Keep all follow-up  visits as directed by your health care provider.   °SEEK IMMEDIATE MEDICAL CARE IF:  °· You develop chest pain, shortness of breath, feel faint, or pass out. °· You have bleeding, swelling larger than a walnut, or drainage from the catheter insertion site. °· You develop pain, discoloration, coldness, or severe bruising in the leg or arm that held the catheter. °· You develop bleeding from any other place such as from the bowels. There may be bright red blood in the urine or stools, or it may appear as black, tarry stools. °· You have a fever or chills. °MAKE SURE YOU: °· Understand these instructions. °· Will watch your condition. °· Will get help right away if you are not doing well or get worse. °Document Released: 08/19/2004 Document Revised: 06/16/2013 Document Reviewed: 07/03/2012 °ExitCare® Patient Information ©2015 [employer]. This information is not intended to replace advice given to you by your health care provider. Make sure you discuss any questions you have with your health care provider. ° °

## 2014-07-04 NOTE — Progress Notes (Signed)
CARDIAC REHAB PHASE I   PRE:  Rate/Rhythm: 86  BP:  Supine:   Sitting: 152/95  Standing:    SaO2: 96 RA  MODE:  Ambulation: 600 ft   POST:  Rate/Rhythm: 95 SR  BP:  Supine:   Sitting: 163/90  Standing:    SaO2: 98 RA Tolerated ambulation well with angina or difficulty.  Education completed re: angina symptoms, NTG usage, when to call 911 or the doctor, activity restrictions, exercise progression, nutrition tips, and phase II cardiac rehab.  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  Is currently taking Chantix and has been smoke free x 2 months.  Reviewed delay and distraction techniques for quitting, given a "Better Quit" cigarette. Reinforced aspirin and antiplatelet drugs and importance.   [id number]  Liliane Channel RN, BSN 07/04/2014 9:40 AM

## 2014-07-04 NOTE — Care Management Note (Addendum)
Case Management Note  Patient Details  Name: Scott Hatfield MRN: [id number] Date of Birth: 06-Oct-1960  Subjective/Objective:                   chest pain Action/Plan: Discharge planning  Expected Discharge Date:  07/04/14               Expected Discharge Plan:  Home/Self Care  In-House Referral:     Discharge planning Services  Medication Assistance  Post Acute Care Choice:    Choice offered to:     DME Arranged:    DME Agency:     HH Arranged:    HH Agency:     Status of Service:  Completed, signed off  Medicare Important Message Given:    Date Medicare IM Given:    Medicare IM give by:    Date Additional Medicare IM Given:    Additional Medicare Important Message give by:     If discussed at [place] of Stay Meetings, dates discussed:    Additional Comments: CM met with pt and gave pt brilinta free 30 day trial card and pt verbalized understanding the 3o days will give the office staff at his follow up visit time to have the medication authorized and refills will be covered.  No other CM needs were communicated. Dellie Catholic, RN 07/04/2014, 9:15 AM

## 2014-07-06 ENCOUNTER — Encounter (HOSPITAL_COMMUNITY): Payer: Self-pay | Admitting: Cardiology

## 2014-07-06 MED FILL — Lidocaine HCl Local Preservative Free (PF) Inj 1%: INTRAMUSCULAR | Qty: 30 | Status: AC

## 2014-07-06 MED FILL — Heparin Sodium (Porcine) 2 Unit/ML in Sodium Chloride 0.9%: INTRAMUSCULAR | Qty: 1000 | Status: AC

## 2014-07-27 ENCOUNTER — Other Ambulatory Visit: Payer: Self-pay

## 2014-07-27 MED ORDER — ATORVASTATIN CALCIUM 40 MG PO TABS: ORAL_TABLET | ORAL | Status: DC

## 2014-08-19 ENCOUNTER — Encounter: Payer: Self-pay | Admitting: Cardiology

## 2014-08-19 ENCOUNTER — Ambulatory Visit (INDEPENDENT_AMBULATORY_CARE_PROVIDER_SITE_OTHER): Payer: BLUE CROSS/BLUE SHIELD | Admitting: Cardiology

## 2014-08-19 VITALS — BP 134/100 | HR 75 | Ht 72.0 in | Wt 281.0 lb

## 2014-08-19 DIAGNOSIS — I10 Essential (primary) hypertension: Secondary | ICD-10-CM

## 2014-08-19 MED ORDER — CLOPIDOGREL BISULFATE 75 MG PO TABS: 75.0000 mg | ORAL_TABLET | Freq: Every day | ORAL | Status: DC

## 2014-08-19 NOTE — Patient Instructions (Signed)
Medication Instructions:  Please stop Brilinta.  Start Plavix 75 mg a day. Continue all other medications as listed.  Follow-Up: Follow up in 4 months with Dr Percival Spanish in [place].  Thank you for choosing [facility]!!

## 2014-08-19 NOTE — Progress Notes (Signed)
HPI Patient presents for evaluation of coronary artery disease. He had a history of circumflex stenting in another town in 2008. He presented urgently in 10/12 with unstable angina and ST segment elevation and was found to have an occluded diagonal. He had a drug-eluting stent placed.  He was most recently in the hospital again with unstable angina and was found to have 95% stenosis in the diagonal again treated with a drug-eluting stent.  He says since going home he just hasn't felt as well. He's not had any of the arm discomfort that he was having was likely his previous angina. He just says he doesn't have the energy did have. He's had some fleeting sharp chest pain. He has had some mildly increased dyspnea. He is not describing PND or orthopnea. He is not describing chest pressure or jaw discomfort. He's not been particularly active though occasionally he will do some yard work. He has gained 14 pounds since he stopped smoking.  No Known Allergies  Current Outpatient Prescriptions  Medication Sig Dispense Refill  . amLODipine (NORVASC) 10 MG tablet Take 1 tablet (10 mg total) by mouth daily. 30 tablet 11  . aspirin 81 MG tablet Take 81 mg by mouth daily.      Marland Kitchen atorvastatin (LIPITOR) 40 MG tablet TAKE ONE TABLET BY MOUTH ONCE DAILY 30 tablet 6  . hydrochlorothiazide (MICROZIDE) 12.5 MG capsule Take 2 capsules (25 mg total) by mouth daily. 180 capsule 3  . lisinopril (PRINIVIL,ZESTRIL) 40 MG tablet Take 1 tablet (40 mg total) by mouth daily. 30 tablet 11  . metoprolol (LOPRESSOR) 50 MG tablet Take 1 tablet (50 mg total) by mouth 2 (two) times daily. 60 tablet 0  . nitroGLYCERIN (NITROSTAT) 0.4 MG SL tablet Place 1 tablet (0.4 mg total) under the tongue every 5 (five) minutes as needed. May repeat for up to 3 doses. 25 tablet prn  . sildenafil (VIAGRA) 50 MG tablet Take 1 tablet (50 mg total) by mouth daily as needed for erectile dysfunction (Do not take if you are taking Nitroglycerin). 10  tablet 2  . ticagrelor (BRILINTA) 90 MG TABS tablet Take 1 tablet (90 mg total) by mouth 2 (two) times daily. 180 tablet 3  . varenicline (CHANTIX) 1 MG tablet Take 1 tablet (1 mg total) by mouth 2 (two) times daily. 60 tablet 3   No current facility-administered medications for this visit.    Past Medical History  Diagnosis Date  . Overweight(278.02)   . Hypertension   . Hyperlipidemia, mixed   . CAD in native artery     Single-vessel CAD with stent placement to the circumflex coronary artery in 2008,  DES to Diag 10/12.  Cath 2016 RCA 25% stenosis, LAD 30% stenosis, ramus intermediate 45% stenosis, D1 95% stenosis treated with a drug-eluting stent  . GERD (gastroesophageal reflux disease)   . Tobacco abuse   . Sleep apnea     CPAP    Past Surgical History  Procedure Laterality Date  . Coronary stent placement  2008, 2012  . Cardiac catheterization N/A 07/03/2014    Procedure: Left Heart Cath and Coronary Angiography;  Surgeon: Peter M Martinique, MD;  Location: [facility];  Service: Cardiovascular;  Laterality: N/A;  . Cardiac catheterization N/A 07/03/2014    Procedure: Coronary Stent Intervention;  Surgeon: Peter M Martinique, MD;  Location: [facility];  Service: Cardiovascular;  Laterality: N/A;   ROS: As stated in the HPI and negative for all other systems.  PHYSICAL EXAM BP 134/100 mmHg  Pulse 75  Ht 6' (1.829 m)  Wt 281 lb (127.461 kg)  BMI 38.10 kg/m2 GENERAL:  Well appearing HEENT:  Pupils equal round and reactive, fundi not visualized, oral mucosa unremarkable NECK:  No jugular venous distention, waveform within normal limits, carotid upstroke brisk and symmetric, no bruits, no thyromegaly LYMPHATICS:  No cervical, inguinal adenopathy LUNGS:  Clear to auscultation bilaterally BACK:  No CVA tenderness CHEST:  Unremarkable HEART:  PMI not displaced or sustained,S1 and S2 within normal limits, no S3, no S4, no clicks, no rubs, no murmurs ABD:  Flat, positive  bowel sounds normal in frequency in pitch, no bruits, no rebound, no guarding, no midline pulsatile mass, no hepatomegaly, no splenomegaly, obese EXT:  2 plus pulses throughout, no edema, no cyanosis no clubbing  EKG: Sinus rhythm, rate 75  axis within normal limits, intervals within normal limits, nonspecific inferior T-wave.  08/19/2014   ASSESSMENT AND PLAN   CAD, NATIVE VESSEL -  He has no unstable symptoms. We discussed at great length secondary risk reduction and hopefully he will start to comply with this. At this point no further testing is suggested.  Of note I will switch from Brilinta to Plavix as he does have some dyspnea.    HYPERTENSION, UNSPECIFIED -  His blood pressure is mildly elevated. He's going to keep a blood pressure diary however, changes if needed. Of note he's probably getting some edema from his max dose of Norvasc but I would like to continue this.  HYPERLIPIDEMIA-MIXED -  He will continue on the meds as listed.  This is followed by BUTLER, CYNTHIA, DO  TOBACCO ABUSE -  He quit smoking!  OVERWEIGHT/OBESITY -  We had a long discussion about this and I gave him specifics.  SLEEP APNEA -  He feels much better on CPAP.  He will continue this.  Hospital records reviewed.

## 2014-10-06 ENCOUNTER — Encounter: Payer: Self-pay | Admitting: Cardiology

## 2014-10-06 ENCOUNTER — Ambulatory Visit (INDEPENDENT_AMBULATORY_CARE_PROVIDER_SITE_OTHER): Payer: BLUE CROSS/BLUE SHIELD | Admitting: Cardiology

## 2014-10-06 VITALS — BP 120/84 | HR 78 | Ht 72.0 in | Wt 280.1 lb

## 2014-10-06 DIAGNOSIS — R5383 Other fatigue: Secondary | ICD-10-CM

## 2014-10-06 MED ORDER — AMLODIPINE BESYLATE 10 MG PO TABS: 5.0000 mg | ORAL_TABLET | Freq: Every day | ORAL | Status: DC

## 2014-10-06 NOTE — Patient Instructions (Signed)
Your physician recommends that you return for lab work today.  Your physician has recommended you make the following change in your medication: decrease the amlodipine to 5 mg daily. ( 1/2 tablet)  Your physician recommends that you schedule a follow-up appointment in: 1 month with Dr. Percival Spanish in the [place] location

## 2014-10-06 NOTE — Progress Notes (Signed)
HPI Patient presents for evaluation of coronary artery disease. He had a history of circumflex stenting in another town in 2008. He presented urgently in 10/12 with unstable angina and ST segment elevation and was found to have an occluded diagonal. He had a drug-eluting stent placed.  He was most recently in the hospital again with unstable angina and was found to have 95% stenosis in the diagonal again treated with a drug-eluting stent.  He called to be added to the schedule today.  He has had some increased lower extremity swelling. This has been slowly progressive. It's making him somewhat uncomfortable. He has gained about 14 pounds since he stopped smoking. He's not having any new shortness of breath, PND or orthopnea. His biggest complaint has been fatigue. He doesn't have the energy he used to have. He's not doing as much around the house. He gets tired if he does something such as working out in his yard trimming. She does have some fleeting left arm discomfort but nothing similar to his previous angina. He's not having any chest pressure or neck pain.  No Known Allergies  Current Outpatient Prescriptions  Medication Sig Dispense Refill  . amLODipine (NORVASC) 10 MG tablet Take 1 tablet (10 mg total) by mouth daily. 30 tablet 11  . aspirin 81 MG tablet Take 81 mg by mouth daily.      Marland Kitchen atorvastatin (LIPITOR) 40 MG tablet TAKE ONE TABLET BY MOUTH ONCE DAILY 30 tablet 6  . clopidogrel (PLAVIX) 75 MG tablet Take 1 tablet (75 mg total) by mouth daily. 90 tablet 3  . hydrochlorothiazide (MICROZIDE) 12.5 MG capsule Take 2 capsules (25 mg total) by mouth daily. 180 capsule 3  . lisinopril (PRINIVIL,ZESTRIL) 40 MG tablet Take 1 tablet (40 mg total) by mouth daily. 30 tablet 11  . metoprolol (LOPRESSOR) 50 MG tablet Take 1 tablet (50 mg total) by mouth 2 (two) times daily. 60 tablet 0  . nitroGLYCERIN (NITROSTAT) 0.4 MG SL tablet Place 1 tablet (0.4 mg total) under the tongue every 5 (five)  minutes as needed. May repeat for up to 3 doses. 25 tablet prn  . sildenafil (VIAGRA) 50 MG tablet Take 1 tablet (50 mg total) by mouth daily as needed for erectile dysfunction ([place] not take if you are taking Nitroglycerin). 10 tablet 2  . varenicline (CHANTIX) 1 MG tablet Take 1 tablet (1 mg total) by mouth 2 (two) times daily. 60 tablet 3   No current facility-administered medications for this visit.    Past Medical History  Diagnosis Date  . Overweight(278.02)   . Hypertension   . Hyperlipidemia, mixed   . CAD in native artery     Single-vessel CAD with stent placement to the circumflex coronary artery in 2008,  DES to Diag 10/12.  Cath 2016 RCA 25% stenosis, LAD 30% stenosis, ramus intermediate 45% stenosis, D1 95% stenosis treated with a drug-eluting stent  . GERD (gastroesophageal reflux disease)   . Tobacco abuse   . Sleep apnea     CPAP    Past Surgical History  Procedure Laterality Date  . Coronary stent placement  2008, 2012  . Cardiac catheterization N/A 07/03/2014    Procedure: Left Heart Cath and Coronary Angiography;  Surgeon: Scott M Martinique, MD;  Location: [facility];  Service: Cardiovascular;  Laterality: N/A;  . Cardiac catheterization N/A 07/03/2014    Procedure: Coronary Stent Intervention;  Surgeon: Scott M Martinique, MD;  Location: [facility];  Service: Cardiovascular;  Laterality: N/A;   ROS: As stated in the HPI and negative for all other systems.  PHYSICAL EXAM BP 120/84 mmHg  Pulse 78  Ht 6' (1.829 m)  Wt 280 lb 2 oz (127.064 kg)  BMI 37.98 kg/m2 GENERAL:  Well appearing HEENT:  Pupils equal round and reactive, fundi not visualized, oral mucosa unremarkable NECK:  No jugular venous distention, waveform within normal limits, carotid upstroke brisk and symmetric, no bruits, no thyromegaly LYMPHATICS:  No cervical, inguinal adenopathy LUNGS:  Clear to auscultation bilaterally BACK:  No CVA tenderness CHEST:  Unremarkable HEART:  PMI not  displaced or sustained,S1 and S2 within normal limits, no S3, no S4, no clicks, no rubs, no murmurs ABD:  Flat, positive bowel sounds normal in frequency in pitch, no bruits, no rebound, no guarding, no midline pulsatile mass, no hepatomegaly, no splenomegaly, obese EXT:  2 plus pulses throughout, mild bilateral leg edema, no cyanosis no clubbing   ASSESSMENT AND PLAN   CAD, NATIVE VESSEL -  He has no unstable symptoms. He will continue with risk reduction. No further cardiac testing testing is suggested.  HYPERTENSION, UNSPECIFIED -  I'm going to reduce his Norvasc as I believe this is causing his edema. He will keep a record of his blood pressure.  HYPERLIPIDEMIA-MIXED -  He will continue on the meds as listed.  This is followed by BUTLER, Scott Hatfield  TOBACCO ABUSE -  He is still not smoking.   OVERWEIGHT/OBESITY -  We have discussed this at length.  SLEEP APNEA -  He feels much better on CPAP.  He will continue this.  EDEMA - I will treat this by lowering the Norvasc.  FATIGUE - I will check a Vit D and testosterone levels.

## 2014-10-07 LAB — TESTOSTERONE: Testosterone: 337 ng/dL (ref 300–890)

## 2014-10-09 LAB — VITAMIN D 1,25 DIHYDROXY
VITAMIN D3 1, 25 (OH): 49 pg/mL
Vitamin D 1, 25 (OH)2 Total: 49 pg/mL (ref 18–72)

## 2014-10-15 ENCOUNTER — Telehealth: Payer: Self-pay | Admitting: Cardiology

## 2014-10-15 NOTE — Telephone Encounter (Signed)
Lab results, Vit D and testosterone results to [employer]

## 2014-10-15 NOTE — Telephone Encounter (Signed)
Would like his last results from last week.

## 2014-10-15 NOTE — Telephone Encounter (Signed)
Would like pt's lab results from last week please.

## 2014-11-04 ENCOUNTER — Encounter: Payer: Self-pay | Admitting: Cardiology

## 2014-11-04 ENCOUNTER — Ambulatory Visit (INDEPENDENT_AMBULATORY_CARE_PROVIDER_SITE_OTHER): Payer: BLUE CROSS/BLUE SHIELD | Admitting: Cardiology

## 2014-11-04 VITALS — BP 125/83 | HR 83 | Ht 72.0 in | Wt 283.0 lb

## 2014-11-04 DIAGNOSIS — I2510 Atherosclerotic heart disease of native coronary artery without angina pectoris: Secondary | ICD-10-CM

## 2014-11-04 MED ORDER — AMLODIPINE BESYLATE 5 MG PO TABS: 5.0000 mg | ORAL_TABLET | Freq: Every day | ORAL | Status: DC

## 2014-11-04 NOTE — Progress Notes (Signed)
HPI Patient presents for evaluation of coronary artery disease. He had a history of circumflex stenting in another town in 2008. He presented urgently in 10/12 with unstable angina and ST segment elevation and was found to have an occluded diagonal. He had a drug-eluting stent placed.  He was in the hospital in May of this year with unstable angina and was found to have 95% stenosis in the diagonal again treated with a drug-eluting stent.   At the last visit he was having some lower externally swelling and fatigue. His Norvasc was decreased.   Labs included a low normal testosterone level and a normal vitamin D level.   Since that time his swelling is slightly improved. His tiredness is slightly improved as well. He denies any other cardiovascular symptoms. . He says he's not been able to do as much activity as he was doing because he hurt his knee.    No Known Allergies  Current Outpatient Prescriptions  Medication Sig Dispense Refill  . amLODipine (NORVASC) 10 MG tablet Take 0.5 tablets (5 mg total) by mouth daily. 30 tablet 11  . aspirin 81 MG tablet Take 81 mg by mouth daily.      Marland Kitchen atorvastatin (LIPITOR) 40 MG tablet TAKE ONE TABLET BY MOUTH ONCE DAILY 30 tablet 6  . clopidogrel (PLAVIX) 75 MG tablet Take 1 tablet (75 mg total) by mouth daily. 90 tablet 3  . hydrochlorothiazide (MICROZIDE) 12.5 MG capsule Take 2 capsules (25 mg total) by mouth daily. 180 capsule 3  . lisinopril (PRINIVIL,ZESTRIL) 40 MG tablet Take 1 tablet (40 mg total) by mouth daily. 30 tablet 11  . metoprolol (LOPRESSOR) 50 MG tablet Take 1 tablet (50 mg total) by mouth 2 (two) times daily. 60 tablet 0  . nitroGLYCERIN (NITROSTAT) 0.4 MG SL tablet Place 1 tablet (0.4 mg total) under the tongue every 5 (five) minutes as needed. May repeat for up to 3 doses. 25 tablet prn  . sildenafil (VIAGRA) 50 MG tablet Take 1 tablet (50 mg total) by mouth daily as needed for erectile dysfunction (Do not take if you are taking  Nitroglycerin). 10 tablet 2  . varenicline (CHANTIX) 1 MG tablet Take 1 mg by mouth daily.     No current facility-administered medications for this visit.    Past Medical History  Diagnosis Date  . Overweight(278.02)   . Hypertension   . Hyperlipidemia, mixed   . CAD in native artery     Single-vessel CAD with stent placement to the circumflex coronary artery in 2008,  DES to Diag 10/12.  Cath 2016 RCA 25% stenosis, LAD 30% stenosis, ramus intermediate 45% stenosis, D1 95% stenosis treated with a drug-eluting stent  . GERD (gastroesophageal reflux disease)   . Sleep apnea     CPAP    Past Surgical History  Procedure Laterality Date  . Coronary stent placement  2008, 2012  . Cardiac catheterization N/A 07/03/2014    Procedure: Left Heart Cath and Coronary Angiography;  Surgeon: Peter M Martinique, MD;  Location: [facility];  Service: Cardiovascular;  Laterality: N/A;  . Cardiac catheterization N/A 07/03/2014    Procedure: Coronary Stent Intervention;  Surgeon: Peter M Martinique, MD;  Location: [facility];  Service: Cardiovascular;  Laterality: N/A;   ROS: As stated in the HPI and negative for all other systems.  PHYSICAL EXAM BP 125/83 mmHg  Pulse 83  Ht 6' (1.829 m)  Wt 283 lb (128.368 kg)  BMI 38.37 kg/m2 GENERAL:  Well appearing HEENT:  Pupils equal round and reactive, fundi not visualized, oral mucosa unremarkable NECK:  No jugular venous distention, waveform within normal limits, carotid upstroke brisk and symmetric, no bruits, no thyromegaly LYMPHATICS:  No cervical, inguinal adenopathy LUNGS:  Clear to auscultation bilaterally BACK:  No CVA tenderness CHEST:  Unremarkable HEART:  PMI not displaced or sustained,S1 and S2 within normal limits, no S3, no S4, no clicks, no rubs, no murmurs ABD:  Flat, positive bowel sounds normal in frequency in pitch, no bruits, no rebound, no guarding, no midline pulsatile mass, no hepatomegaly, no splenomegaly, obese EXT:  2  plus pulses throughout, trace bilateral leg edema, no cyanosis no clubbing   ASSESSMENT AND PLAN   CAD, NATIVE VESSEL -  He has no unstable symptoms. He will continue with risk reduction. No further cardiac testing testing is suggested.  HYPERTENSION, UNSPECIFIED -  His blood pressure is OK with the reduced Norvasc.   He will continue the meds as listed.  HYPERLIPIDEMIA-MIXED -  He will continue on the meds as listed.  This is followed by BUTLER, CYNTHIA, DO  TOBACCO ABUSE -  He is still not smoking.   OVERWEIGHT/OBESITY -  We have discussed this at length at previous visits.  Marland Kitchen  SLEEP APNEA -  He feels much better on CPAP.  He will continue this.  EDEMA - No further workup is planned. This is improved.  FATIGUE - If this continues in the future I would suggest follow-up sleep study.

## 2014-11-04 NOTE — Patient Instructions (Signed)
Medication Instructions:  The current medical regimen is effective;  continue present plan and medications.  Follow-Up: Follow up in 6 months with Dr. Percival Spanish.  You will receive a letter in the mail 2 months before you are due.  Please call us when you receive this letter to schedule your follow up appointment.  Thank you for choosing [facility]!!

## 2014-11-05 ENCOUNTER — Encounter: Payer: Self-pay | Admitting: Physician Assistant

## 2014-11-05 ENCOUNTER — Ambulatory Visit (INDEPENDENT_AMBULATORY_CARE_PROVIDER_SITE_OTHER): Payer: Worker's Compensation | Admitting: Physician Assistant

## 2014-11-05 VITALS — BP 140/92 | HR 91 | Temp 98.5°F | Ht 72.0 in | Wt 283.6 lb

## 2014-11-05 DIAGNOSIS — M25562 Pain in left knee: Secondary | ICD-10-CM | POA: Diagnosis not present

## 2014-11-05 MED ORDER — PREDNISONE 10 MG (21) PO TBPK: ORAL_TABLET | ORAL | Status: DC

## 2014-11-05 MED ORDER — DICLOFENAC SODIUM 75 MG PO TBEC: 75.0000 mg | DELAYED_RELEASE_TABLET | Freq: Two times a day (BID) | ORAL | Status: DC

## 2014-11-05 NOTE — Progress Notes (Signed)
   Subjective:    Patient ID: Scott Hatfield, male    DOB: 09/22/1960, 54 y.o.   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 He felt his knee pop as it twisted. He has been taking ibuprofen occasionally, however the pain has not improved. Pain is worse with standing or walking up stairs. He is unable to apply any pressure to the knee or bend down on it. He also has pain in his upper calf and feels like it "loose".     Review of Systems  Constitutional: Negative.   HENT: Negative.   Eyes: Negative.   Respiratory: Negative.   Cardiovascular: Negative.   Gastrointestinal: Negative.   Endocrine: Negative.   Genitourinary: Negative.   Musculoskeletal: Positive for arthralgias (left knee pain, swelling ).       Objective:   Physical Exam  Constitutional: He appears well-developed and well-nourished.  Pulmonary/Chest: Effort normal.  Musculoskeletal: Normal range of motion. He exhibits edema (trace , left knee ) and tenderness.  Neurological: He is alert.  Psychiatric: He has a normal mood and affect. His behavior is normal. Judgment and thought content normal.  Nursing note and vitals reviewed.         Assessment & Plan:  1. Left knee pain  - diclofenac (VOLTAREN) 75 MG EC tablet; Take 1 tablet (75 mg total) by mouth 2 (two) times daily.  Dispense: 30 tablet; Refill: 0 - predniSONE (STERAPRED UNI-PAK 21 TAB) 10 MG (21) TBPK tablet; 6 pills PO on day 1, 5 on day 2, 4 on day 3, 3 on day 4, 2 on day 5, 1 on day 6  Dispense: 21 tablet; Refill: 0   F/U 2 weeks if no improvement Will refer to ortho   Tiffany A. Benjamin Stain PA-C

## 2014-11-05 NOTE — Patient Instructions (Signed)
DO NOT TAKE ASPIRIN WHILE TAKING THE DICLOFENAC X 2 WEEKS   Knee Pain The knee is the complex joint between your thigh and your lower leg. It is made up of bones, tendons, ligaments, and cartilage. The bones that make up the knee are:  The femur in the thigh.  The tibia and fibula in the lower leg.  The patella or kneecap riding in the groove on the lower femur. CAUSES  Knee pain is a common complaint with many causes. A few of these causes are:  Injury, such as:  A ruptured ligament or tendon injury.  Torn cartilage.  Medical conditions, such as:  Gout  Arthritis  Infections  Overuse, over training, or overdoing a physical activity. Knee pain can be minor or severe. Knee pain can accompany debilitating injury. Minor knee problems often respond well to self-care measures or get well on their own. More serious injuries may need medical intervention or even surgery. SYMPTOMS The knee is complex. Symptoms of knee problems can vary widely. Some of the problems are:  Pain with movement and weight bearing.  Swelling and tenderness.  Buckling of the knee.  Inability to straighten or extend your knee.  Your knee locks and you cannot straighten it.  Warmth and redness with pain and fever.  Deformity or dislocation of the kneecap. DIAGNOSIS  Determining what is wrong may be very straight forward such as when there is an injury. It can also be challenging because of the complexity of the knee. Tests to make a diagnosis may include:  Your caregiver taking a history and doing a physical exam.  Routine X-rays can be used to rule out other problems. X-rays will not reveal a cartilage tear. Some injuries of the knee can be diagnosed by:  Arthroscopy a surgical technique by which a small video camera is inserted through tiny incisions on the sides of the knee. This procedure is used to examine and repair internal knee joint problems. Tiny instruments can be used during  arthroscopy to repair the torn knee cartilage (meniscus).  Arthrography is a radiology technique. A contrast liquid is directly injected into the knee joint. Internal structures of the knee joint then become visible on X-ray film.  An MRI scan is a non X-ray radiology procedure in which magnetic fields and a computer produce two- or three-dimensional images of the inside of the knee. Cartilage tears are often visible using an MRI scanner. MRI scans have largely replaced arthrography in diagnosing cartilage tears of the knee.  Blood work.  Examination of the fluid that helps to lubricate the knee joint (synovial fluid). This is done by taking a sample out using a needle and a syringe. TREATMENT The treatment of knee problems depends on the cause. Some of these treatments are:  Depending on the injury, proper casting, splinting, surgery, or physical therapy care will be needed.  Give yourself adequate recovery time. Do not overuse your joints. If you begin to get sore during workout routines, back off. Slow down or do fewer repetitions.  For repetitive activities such as cycling or running, maintain your strength and nutrition.  Alternate muscle groups. For example, if you are a weight lifter, work the upper body on one day and the lower body the next.  Either tight or weak muscles do not give the proper support for your knee. Tight or weak muscles do not absorb the stress placed on the knee joint. Keep the muscles surrounding the knee strong.  Take care of mechanical  problems.  If you have flat feet, orthotics or special shoes may help. See your caregiver if you need help.  Arch supports, sometimes with wedges on the inner or outer aspect of the heel, can help. These can shift pressure away from the side of the knee most bothered by osteoarthritis.  A brace called an "unloader" brace also may be used to help ease the pressure on the most arthritic side of the knee.  If your caregiver has  prescribed crutches, braces, wraps or ice, use as directed. The acronym for this is PRICE. This means protection, rest, ice, compression, and elevation.  Nonsteroidal anti-inflammatory drugs (NSAIDs), can help relieve pain. But if taken immediately after an injury, they may actually increase swelling. Take NSAIDs with food in your stomach. Stop them if you develop stomach problems. Do not take these if you have a history of ulcers, stomach pain, or bleeding from the bowel. Do not take without your caregiver's approval if you have problems with fluid retention, heart failure, or kidney problems.  For ongoing knee problems, physical therapy may be helpful.  Glucosamine and chondroitin are over-the-counter dietary supplements. Both may help relieve the pain of osteoarthritis in the knee. These medicines are different from the usual anti-inflammatory drugs. Glucosamine may decrease the rate of cartilage destruction.  Injections of a corticosteroid drug into your knee joint may help reduce the symptoms of an arthritis flare-up. They may provide pain relief that lasts a few months. You may have to wait a few months between injections. The injections do have a small increased risk of infection, water retention, and elevated blood sugar levels.  Hyaluronic acid injected into damaged joints may ease pain and provide lubrication. These injections may work by reducing inflammation. A series of shots may give relief for as long as 6 months.  Topical painkillers. Applying certain ointments to your skin may help relieve the pain and stiffness of osteoarthritis. Ask your pharmacist for suggestions. Many over the-counter products are approved for temporary relief of arthritis pain.  In some countries, doctors often prescribe topical NSAIDs for relief of chronic conditions such as arthritis and tendinitis. A review of treatment with NSAID creams found that they worked as well as oral medications but without the serious  side effects. PREVENTION  Maintain a healthy weight. Extra pounds put more strain on your joints.  Get strong, stay limber. Weak muscles are a common cause of knee injuries. Stretching is important. Include flexibility exercises in your workouts.  Be smart about exercise. If you have osteoarthritis, chronic knee pain or recurring injuries, you may need to change the way you exercise. This does not mean you have to stop being active. If your knees ache after jogging or playing basketball, consider switching to swimming, water aerobics, or other low-impact activities, at least for a few days a week. Sometimes limiting high-impact activities will provide relief.  Make sure your shoes fit well. Choose footwear that is right for your sport.  Protect your knees. Use the proper gear for knee-sensitive activities. Use kneepads when playing volleyball or laying carpet. Buckle your seat belt every time you drive. Most shattered kneecaps occur in car accidents.  Rest when you are tired. SEEK MEDICAL CARE IF:  You have knee pain that is continual and does not seem to be getting better.  SEEK IMMEDIATE MEDICAL CARE IF:  Your knee joint feels hot to the touch and you have a high fever. MAKE SURE YOU:   Understand these instructions.  Will  watch your condition.  Will get help right away if you are not doing well or get worse. Document Released: 11/27/2006 Document Revised: 04/24/2011 Document Reviewed: 11/27/2006 [facility] Patient Information 2015 [place], [place]. This information is not intended to replace advice given to you by your health care provider. Make sure you discuss any questions you have with your health care provider.

## 2014-11-10 ENCOUNTER — Telehealth: Payer: Self-pay | Admitting: Cardiology

## 2014-11-10 NOTE — Telephone Encounter (Signed)
Pt's wife called in stating that the pt was prescribed Diclofenac by his workman's comp doctor and when this medication was sent in to the pharmacy the pharmacist thought that his cardiologist should be notified because of a possible drug interaction. Please call [facility] in [place].   Thanks

## 2014-11-10 NOTE — Telephone Encounter (Signed)
Opened accidentally 

## 2014-11-10 NOTE — Telephone Encounter (Signed)
Ok to use diclofenac WITH FOOD for 3-4 days while on clopidogrel.  Have patient be aware of increased bruising risk.  Would recommend to then hold for another 3-4 days before repeating.

## 2014-11-10 NOTE — Telephone Encounter (Signed)
Pt's wife called in stating that the pt was prescribed Diclofenac by his workman's comp doctor and when this medication was sent in to the pharmacy the pharmacist thought that his cardiologist should be notified because of a possible drug interaction   Routed to Tommy Medal, pharmacist

## 2014-11-10 NOTE — Telephone Encounter (Signed)
Told patients wife that he may take it with food for 3-4 days while on clopidogrel and to expect increased risk of bruising  Would hold the diclofenac for 3-4 days before repeating again for 3-4 days

## 2014-11-17 NOTE — Telephone Encounter (Signed)
Can this encounter be closed?

## 2014-12-08 ENCOUNTER — Ambulatory Visit (INDEPENDENT_AMBULATORY_CARE_PROVIDER_SITE_OTHER): Payer: BLUE CROSS/BLUE SHIELD | Admitting: *Deleted

## 2014-12-08 DIAGNOSIS — Z23 Encounter for immunization: Secondary | ICD-10-CM

## 2014-12-17 ENCOUNTER — Ambulatory Visit: Payer: Worker's Compensation | Attending: Orthopedic Surgery | Admitting: Physical Therapy

## 2014-12-17 DIAGNOSIS — M25562 Pain in left knee: Secondary | ICD-10-CM | POA: Diagnosis not present

## 2014-12-17 NOTE — Therapy (Signed)
[facility] [place], [place], [address] Phone: [phone]   Fax:  [phone]  Physical Therapy Evaluation  Patient Details  Name: Scott Hatfield MRN: [id number] Date of Birth: Apr 03, 1960 Referring Provider: Tamsen Roers MD.  Encounter Date: 12/17/2014      PT End of Session - 12/17/14 1648    Visit Number 1   Number of Visits 12   Date for PT Re-Evaluation 02/04/15   PT Start Time 0230   PT Stop Time 0317   PT Time Calculation (min) 47 min   Activity Tolerance Patient tolerated treatment well   Behavior During Therapy [facility] for tasks assessed/performed      Past Medical History  Diagnosis Date  . Overweight(278.02)   . Hypertension   . Hyperlipidemia, mixed   . CAD in native artery     Single-vessel CAD with stent placement to the circumflex coronary artery in 2008,  DES to Diag 10/12.  Cath 2016 RCA 25% stenosis, LAD 30% stenosis, ramus intermediate 45% stenosis, D1 95% stenosis treated with a drug-eluting stent  . GERD (gastroesophageal reflux disease)   . Sleep apnea     CPAP    Past Surgical History  Procedure Laterality Date  . Coronary stent placement  2008, 2012  . Cardiac catheterization N/A 07/03/2014    Procedure: Left Heart Cath and Coronary Angiography;  Surgeon: Peter M Martinique, MD;  Location: [place] [facility];  Service: Cardiovascular;  Laterality: N/A;  . Cardiac catheterization N/A 07/03/2014    Procedure: Coronary Stent Intervention;  Surgeon: Peter M Martinique, MD;  Location: [place] [facility];  Service: Cardiovascular;  Laterality: N/A;    There were no vitals filed for this visit.  Visit Diagnosis:  Left knee pain - Plan: PT plan of care cert/re-cert      Subjective Assessment - 12/17/14 1710    Patient Stated Goals Get my knee back to normal.   Currently in Pain? Yes   Pain Score 4    Pain Location Knee   Pain Orientation Left   Pain Descriptors / Indicators Aching   Pain Onset  More than a month ago   Pain Frequency Constant   Aggravating Factors  Bending and walking upstairs.   Pain Relieving Factors Rest.            [facility] PT Assessment - 12/17/14 0001    Assessment   Medical Diagnosis Chondromalacia; left MCL sprain.   Referring Provider Tamsen Roers MD.   Onset Date/Surgical Date --  September 28, 2014.   Precautions   Precautions None   Restrictions   Weight Bearing Restrictions No   Balance Screen   Has the patient fallen in the past 6 months No   Has the patient had a decrease in activity level because of a fear of falling?  No   Is the patient reluctant to leave their home because of a fear of falling?  No   Home Environment   Living Environment Private residence   ROM / Strength   AROM / PROM / Strength AROM;Strength   AROM   Overall AROM Comments Full active left knee range of motion though crepitus palpated during active left knee ROM.   Strength   Overall Strength Comments Normal left knee strength.   Palpation   Palpation comment Tender to palpation over left knee medial joint line and in region of left distal lateral hamstring region.   Special Tests    Special Tests --  Mild valgus instability of left knee.   Ambulation/Gait   Gait Comments Essentially normal gait cycle.                   [facility] Adult PT Treatment/Exercise - 12/17/14 0001    Modalities   Modalities Electrical Stimulation   Electrical Stimulation   Electrical Stimulation Location Left knee.   Electrical Stimulation Action IFC at 80-150 HZ x 15 minutes.   Electrical Stimulation Goals Pain                     PT Long Term Goals - 12/17/14 1718    PT LONG TERM GOAL #1   Title Ind with an HEP.   Time 4   Period Weeks   Status New   PT LONG TERM GOAL #2   Title Perform ADL's with pain not > 2-3/10.   Time 4   Period Weeks   Status New   PT LONG TERM GOAL #3   Title Perform a flight of stairs in a reciprocating fashion with pain not >  2-3/10.               Plan - 12/17/14 1653    Clinical Impression Statement The patient stepped out of his work truck in August (2016) and felt pain in his left knee.  He has been diagnosed with chondromalacia and a sprain of hisleft MCL.  He had a cortisone injection which helped a lot.  His pain-level is a 3-4/10 at rest today and higher (5-6/10) with bending and walking upstairs.  The patient further states his knee just "doesn't feel right."   Pt will benefit from skilled therapeutic intervention in order to improve on the following deficits Pain;Decreased activity tolerance   Rehab Potential Excellent   PT Frequency 3x / week   PT Duration 4 weeks   PT Treatment/Interventions ADLs/Self Care Home Management;Cryotherapy;Electrical Stimulation;Moist Heat;Therapeutic exercise;Therapeutic activities;Patient/family education;Manual techniques;Vasopneumatic Device   PT Next Visit Plan Pain-free left quadriceps strengthening.  Stationary bike and modalities PRN.   Consulted and Agree with Plan of Care Patient         Problem List Patient Active Problem List   Diagnosis Date Noted  . Sleep apnea-on C-pap 07/04/2014  . Acute coronary syndrome ([place]) 07/04/2014  . Chest pain 07/03/2014  . Unstable angina ([place])   . PRECORDIAL PAIN 03/02/2010  . TOBACCO ABUSE 11/12/2008  . Dyslipidemia 10/07/2008  . OVERWEIGHT/OBESITY 10/07/2008  . Essential hypertension 10/07/2008  . CAD- S/P PCI '08, 2012, Dx DES 07/03/14 10/07/2008    Cherisa Brucker, [place] MPT 12/17/2014, 5:24 PM  [facility] [address] [place], [place], [address] Phone: [phone]   Fax:  [phone]  Name: Scott Hatfield MRN: [id number] Date of Birth: [birth year]

## 2014-12-22 ENCOUNTER — Encounter: Payer: Self-pay | Admitting: Physical Therapy

## 2014-12-24 ENCOUNTER — Ambulatory Visit: Payer: Worker's Compensation | Admitting: Physical Therapy

## 2014-12-24 DIAGNOSIS — M25562 Pain in left knee: Secondary | ICD-10-CM

## 2014-12-24 NOTE — Therapy (Signed)
[facility] [place], [place], [address] Phone: [phone]   Fax:  [phone]  Physical Therapy Treatment  Patient Details  Name: Scott Hatfield MRN: [id number] Date of Birth: Nov 03, 1960 Referring Provider: Tamsen Roers MD.  Encounter Date: 12/24/2014      PT End of Session - 12/24/14 1824    Visit Number 2   Number of Visits 12   Date for PT Re-Evaluation 02/04/15   PT Start Time 0315   PT Stop Time 0407   PT Time Calculation (min) 52 min      Past Medical History  Diagnosis Date  . Overweight(278.02)   . Hypertension   . Hyperlipidemia, mixed   . CAD in native artery     Single-vessel CAD with stent placement to the circumflex coronary artery in 2008,  DES to Diag 10/12.  Cath 2016 RCA 25% stenosis, LAD 30% stenosis, ramus intermediate 45% stenosis, D1 95% stenosis treated with a drug-eluting stent  . GERD (gastroesophageal reflux disease)   . Sleep apnea     CPAP    Past Surgical History  Procedure Laterality Date  . Coronary stent placement  2008, 2012  . Cardiac catheterization N/A 07/03/2014    Procedure: Left Heart Cath and Coronary Angiography;  Surgeon: Peter M Martinique, MD;  Location: [place] [facility];  Service: Cardiovascular;  Laterality: N/A;  . Cardiac catheterization N/A 07/03/2014    Procedure: Coronary Stent Intervention;  Surgeon: Peter M Martinique, MD;  Location: [place] [facility];  Service: Cardiovascular;  Laterality: N/A;    There were no vitals filed for this visit.  Visit Diagnosis:  Left knee pain      Subjective Assessment - 12/24/14 1816    Subjective No new complaints.   Patient Stated Goals Get my knee back to normal.   Pain Score 4    Pain Location Knee   Pain Orientation Left   Pain Descriptors / Indicators Aching   Pain Onset More than a month ago                                      PT Long Term Goals - 12/17/14 1718    PT LONG TERM  GOAL #1   Title Ind with an HEP.   Time 4   Period Weeks   Status New   PT LONG TERM GOAL #2   Title Perform ADL's with pain not > 2-3/10.   Time 4   Period Weeks   Status New   PT LONG TERM GOAL #3   Title Perform a flight of stairs in a reciprocating fashion with pain not > 2-3/10.               Problem List Patient Active Problem List   Diagnosis Date Noted  . Sleep apnea-on C-pap 07/04/2014  . Acute coronary syndrome ([place]) 07/04/2014  . Chest pain 07/03/2014  . Unstable angina ([place])   . PRECORDIAL PAIN 03/02/2010  . TOBACCO ABUSE 11/12/2008  . Dyslipidemia 10/07/2008  . OVERWEIGHT/OBESITY 10/07/2008  . Essential hypertension 10/07/2008  . CAD- S/P PCI '08, 2012, Dx DES 07/03/14 10/07/2008   Treatment:  Stationary bike x 10 minutes (level 2) f/b SAQ's with 5# facilitated with VMS to left quadriceps x 15 minutes (10 sec extension holds and 10 sec rest) f/b pre-mod e'stim at 80-150 HZ and medium vasopneumatic x 15 minutes.  Patient  tolerated treatment well.  Shawnique Mariotti, [place] MPT 12/24/2014, 6:32 PM  [facility] [address] [place], [place], [address] Phone: [phone]   Fax:  [phone]  Name: SAVION DUNAVIN MRN: [id number] Date of Birth: 08/04/1960

## 2014-12-29 ENCOUNTER — Encounter: Payer: Self-pay | Admitting: Physical Therapy

## 2014-12-31 ENCOUNTER — Ambulatory Visit: Payer: Worker's Compensation | Admitting: *Deleted

## 2014-12-31 DIAGNOSIS — M25562 Pain in left knee: Secondary | ICD-10-CM | POA: Diagnosis not present

## 2014-12-31 NOTE — Therapy (Cosign Needed)
[place], [place], [address] Phone: [phone]   Fax:  [phone]  Physical Therapy Treatment  Patient Details  Name: Scott Hatfield MRN: [id number] Date of Birth: 1960/05/18 Referring Provider: Tamsen Roers MD.  Encounter Date: 12/31/2014      PT End of Session - 12/31/14 1615    Visit Number 3   Number of Visits 12   Date for PT Re-Evaluation 02/04/15   PT Start Time 1500   PT Stop Time 1600   PT Time Calculation (min) 60 min      Past Medical History  Diagnosis Date  . Overweight(278.02)   . Hypertension   . Hyperlipidemia, mixed   . CAD in native artery     Single-vessel CAD with stent placement to the circumflex coronary artery in 2008,  DES to Diag 10/12.  Cath 2016 RCA 25% stenosis, LAD 30% stenosis, ramus intermediate 45% stenosis, D1 95% stenosis treated with a drug-eluting stent  . GERD (gastroesophageal reflux disease)   . Sleep apnea     CPAP    Past Surgical History  Procedure Laterality Date  . Coronary stent placement  2008, 2012  . Cardiac catheterization N/A 07/03/2014    Procedure: Left Heart Cath and Coronary Angiography;  Surgeon: Peter M Martinique, MD;  Location: [place] [facility];  Service: Cardiovascular;  Laterality: N/A;  . Cardiac catheterization N/A 07/03/2014    Procedure: Coronary Stent Intervention;  Surgeon: Peter M Martinique, MD;  Location: [place] [facility];  Service: Cardiovascular;  Laterality: N/A;    There were no vitals filed for this visit.  Visit Diagnosis:  Left knee pain      Subjective Assessment - 12/31/14 1617    Subjective LT knee is doing better, But now the RT leg is starting to hurt and feel tight   Patient Stated Goals Get my knee back to normal.   Currently in Pain? Yes   Pain Score 4    Pain Location Knee   Pain Orientation Left   Pain Descriptors / Indicators Aching   Pain Onset More than a month ago   Pain Frequency Constant    Aggravating Factors  Going upstairs   Pain Relieving Factors Rxs and rest                         [facility] Adult PT Treatment/Exercise - 12/31/14 0001    Exercises   Exercises Knee/Hip   Knee/Hip Exercises: Aerobic   Stationary Bike L1 x 15 mins   Knee/Hip Exercises: Supine   Short Arc Quad Sets Strengthening  5# x 15 mins with VMS   Modalities   Modalities Electrical Stimulation;Vasopneumatic;Ultrasound   Acupuncturist Stimulation Location Left knee.VMS 10 sec on/off x 15 min with SAQs 5# wt,   IFC x 15 min 1-10hz  with Vaso   Electrical Stimulation Goals Pain   Ultrasound   Ultrasound Location LT knee   Ultrasound Parameters Combo 1.5 w/cm2 x 10 min  to medial aspect   Ultrasound Goals Pain;Edema   Vasopneumatic   Number Minutes Vasopneumatic  15 minutes   Vasopnuematic Location  Knee   Vasopneumatic Pressure Medium   Vasopneumatic Temperature  36                     PT Long Term Goals - 12/17/14 1718    PT LONG TERM GOAL #1   Title Ind with an HEP.  Time 4   Period Weeks   Status New   PT LONG TERM GOAL #2   Title Perform ADL's with pain not > 2-3/10.   Time 4   Period Weeks   Status New   PT LONG TERM GOAL #3   Title Perform a flight of stairs in a reciprocating fashion with pain not > 2-3/10.               Plan - 12/31/14 1616    Pt will benefit from skilled therapeutic intervention in order to improve on the following deficits Pain;Decreased activity tolerance   Rehab Potential Excellent   PT Frequency 3x / week   PT Duration 4 weeks   PT Treatment/Interventions ADLs/Self Care Home Management;Cryotherapy;Electrical Stimulation;Moist Heat;Therapeutic exercise;Therapeutic activities;Patient/family education;Manual techniques;Vasopneumatic Device;Ultrasound  [place] added by PT. ROUTE for cosign   PT Next Visit Plan Pain-free left quadriceps strengthening.  Stationary bike and modalities PRN.         Problem List Patient Active Problem List   Diagnosis Date Noted  . Sleep apnea-on C-pap 07/04/2014  . Acute coronary syndrome ([place]) 07/04/2014  . Chest pain 07/03/2014  . Unstable angina ([place])   . PRECORDIAL PAIN 03/02/2010  . TOBACCO ABUSE 11/12/2008  . Dyslipidemia 10/07/2008  . OVERWEIGHT/OBESITY 10/07/2008  . Essential hypertension 10/07/2008  . CAD- S/P PCI '08, 2012, Dx DES 07/03/14 10/07/2008    Domnick Chervenak,CHRIS, PTA 12/31/2014, 6:18 PM  [facility] [address] [place], [place], [address] Phone: [phone]   Fax:  [phone]  Name: Scott Hatfield MRN: [id number] Date of Birth: 02/19/1960

## 2014-12-31 NOTE — Therapy (Signed)
[facility] [place], [place], [address] Phone: [phone]   Fax:  [phone]  Physical Therapy Treatment  Patient Details  Name: Scott Hatfield MRN: [id number] Date of Birth: 1960-12-25 Referring Provider: Tamsen Roers MD.  Encounter Date: 12/31/2014      PT End of Session - 12/31/14 1615    Visit Number 3   Number of Visits 12   Date for PT Re-Evaluation 02/04/15   PT Start Time 1500   PT Stop Time 1600   PT Time Calculation (min) 60 min      Past Medical History  Diagnosis Date  . Overweight(278.02)   . Hypertension   . Hyperlipidemia, mixed   . CAD in native artery     Single-vessel CAD with stent placement to the circumflex coronary artery in 2008,  DES to Diag 10/12.  Cath 2016 RCA 25% stenosis, LAD 30% stenosis, ramus intermediate 45% stenosis, D1 95% stenosis treated with a drug-eluting stent  . GERD (gastroesophageal reflux disease)   . Sleep apnea     CPAP    Past Surgical History  Procedure Laterality Date  . Coronary stent placement  2008, 2012  . Cardiac catheterization N/A 07/03/2014    Procedure: Left Heart Cath and Coronary Angiography;  Surgeon: Peter M Martinique, MD;  Location: [place] [facility];  Service: Cardiovascular;  Laterality: N/A;  . Cardiac catheterization N/A 07/03/2014    Procedure: Coronary Stent Intervention;  Surgeon: Peter M Martinique, MD;  Location: [place] [facility];  Service: Cardiovascular;  Laterality: N/A;    There were no vitals filed for this visit.  Visit Diagnosis:  Left knee pain      Subjective Assessment - 12/31/14 1617    Subjective LT knee is doing better, But now the RT leg is starting to hurt and feel tight   Patient Stated Goals Get my knee back to normal.   Currently in Pain? Yes   Pain Score 4    Pain Location Knee   Pain Orientation Left   Pain Descriptors / Indicators Aching   Pain Onset More than a month ago   Pain Frequency Constant    Aggravating Factors  Going upstairs   Pain Relieving Factors Rxs and rest                         [facility] Adult PT Treatment/Exercise - 12/31/14 0001    Exercises   Exercises Knee/Hip   Knee/Hip Exercises: Aerobic   Stationary Bike L1 x 15 mins   Knee/Hip Exercises: Supine   Short Arc Quad Sets Strengthening  5# x 15 mins with VMS   Modalities   Modalities Electrical Stimulation;Vasopneumatic;Ultrasound   Acupuncturist Stimulation Location Left knee.VMS 10 sec on/off x 15 min with SAQs 5# wt,   IFC x 15 min 1-10hz  with Vaso   Electrical Stimulation Goals Pain   Ultrasound   Ultrasound Location LT knee   Ultrasound Parameters Combo 1.5 w/cm2 x 10 min  to medial aspect   Ultrasound Goals Pain;Edema   Vasopneumatic   Number Minutes Vasopneumatic  15 minutes   Vasopnuematic Location  Knee   Vasopneumatic Pressure Medium   Vasopneumatic Temperature  36                     PT Long Term Goals - 12/17/14 1718    PT LONG TERM GOAL #1   Title Ind with an HEP.  Time 4   Period Weeks   Status New   PT LONG TERM GOAL #2   Title Perform ADL's with pain not > 2-3/10.   Time 4   Period Weeks   Status New   PT LONG TERM GOAL #3   Title Perform a flight of stairs in a reciprocating fashion with pain not > 2-3/10.               Plan - 12/31/14 1616    Pt will benefit from skilled therapeutic intervention in order to improve on the following deficits Pain;Decreased activity tolerance   Rehab Potential Excellent   PT Frequency 3x / week   PT Duration 4 weeks   PT Treatment/Interventions ADLs/Self Care Home Management;Cryotherapy;Electrical Stimulation;Moist Heat;Therapeutic exercise;Therapeutic activities;Patient/family education;Manual techniques;Vasopneumatic Device;Ultrasound  [place] added by PT. ROUTE for cosign   PT Next Visit Plan Pain-free left quadriceps strengthening.  Stationary bike and modalities PRN.         Problem List Patient Active Problem List   Diagnosis Date Noted  . Sleep apnea-on C-pap 07/04/2014  . Acute coronary syndrome ([place]) 07/04/2014  . Chest pain 07/03/2014  . Unstable angina ([place])   . PRECORDIAL PAIN 03/02/2010  . TOBACCO ABUSE 11/12/2008  . Dyslipidemia 10/07/2008  . OVERWEIGHT/OBESITY 10/07/2008  . Essential hypertension 10/07/2008  . CAD- S/P PCI '08, 2012, Dx DES 07/03/14 10/07/2008    RAMSEUR,CHRIS, PTA 12/31/2014, 4:21 PM  [facility] [address] [place], [place], [address] Phone: [phone]   Fax:  [phone]  Name: TAURIAN LEY MRN: [id number] Date of Birth: 1960/05/09

## 2015-01-05 ENCOUNTER — Ambulatory Visit: Payer: Worker's Compensation | Admitting: Physical Therapy

## 2015-01-05 DIAGNOSIS — M25562 Pain in left knee: Secondary | ICD-10-CM

## 2015-01-05 NOTE — Therapy (Signed)
[facility] [place], [place], [address] Phone: [phone]   Fax:  [phone]  Physical Therapy Treatment  Patient Details  Name: RAMCES STETZER MRN: [id number] Date of Birth: 04/03/1960 Referring Provider: Tamsen Roers MD.  Encounter Date: 01/05/2015      PT End of Session - 01/05/15 1610    Visit Number 4   Number of Visits 12   Date for PT Re-Evaluation 02/04/15   PT Start Time 0315   PT Stop Time 0354   PT Time Calculation (min) 39 min   Activity Tolerance Patient tolerated treatment well   Behavior During Therapy [facility] for tasks assessed/performed      Past Medical History  Diagnosis Date  . Overweight(278.02)   . Hypertension   . Hyperlipidemia, mixed   . CAD in native artery     Single-vessel CAD with stent placement to the circumflex coronary artery in 2008,  DES to Diag 10/12.  Cath 2016 RCA 25% stenosis, LAD 30% stenosis, ramus intermediate 45% stenosis, D1 95% stenosis treated with a drug-eluting stent  . GERD (gastroesophageal reflux disease)   . Sleep apnea     CPAP    Past Surgical History  Procedure Laterality Date  . Coronary stent placement  2008, 2012  . Cardiac catheterization N/A 07/03/2014    Procedure: Left Heart Cath and Coronary Angiography;  Surgeon: Peter M Martinique, MD;  Location: [place] [facility];  Service: Cardiovascular;  Laterality: N/A;  . Cardiac catheterization N/A 07/03/2014    Procedure: Coronary Stent Intervention;  Surgeon: Peter M Martinique, MD;  Location: [place] [facility];  Service: Cardiovascular;  Laterality: N/A;    There were no vitals filed for this visit.  Visit Diagnosis:  Left knee pain      Subjective Assessment - 01/05/15 1611    Subjective Don't feel I can do the bike, my right knee hurts too much.  Seeing the Dr. on 01/15/15.   Patient Stated Goals Get my knee back to normal.   Pain Score 3    Pain Location Knee   Pain Orientation Left                          [facility] Adult PT Treatment/Exercise - 01/05/15 0001    Modalities   Modalities Electrical Stimulation   Electrical Stimulation   Electrical Stimulation Location Left medial knee   Electrical Stimulation Action Constant Pre-mod e'stim at 80-150 HZ x 20 minutes.   Ultrasound   Ultrasound Location --  Left medial weeks.   Ultrasound Parameters Combo E'stim/U/S at 1.50 W/CM2 x 8 minutes.   Vasopneumatic   Number Minutes Vasopneumatic  15 minutes   Vasopnuematic Location  --  Left medial knee.   Vasopneumatic Pressure Medium                     PT Long Term Goals - 12/17/14 1718    PT LONG TERM GOAL #1   Title Ind with an HEP.   Time 4   Period Weeks   Status New   PT LONG TERM GOAL #2   Title Perform ADL's with pain not > 2-3/10.   Time 4   Period Weeks   Status New   PT LONG TERM GOAL #3   Title Perform a flight of stairs in a reciprocating fashion with pain not > 2-3/10.  Problem List Patient Active Problem List   Diagnosis Date Noted  . Sleep apnea-on C-pap 07/04/2014  . Acute coronary syndrome ([place]) 07/04/2014  . Chest pain 07/03/2014  . Unstable angina ([place])   . PRECORDIAL PAIN 03/02/2010  . TOBACCO ABUSE 11/12/2008  . Dyslipidemia 10/07/2008  . OVERWEIGHT/OBESITY 10/07/2008  . Essential hypertension 10/07/2008  . CAD- S/P PCI '08, 2012, Dx DES 07/03/14 10/07/2008    Caden Fukushima, [place] MPT 01/05/2015, 4:19 PM  [facility] [address] [place], [place], [address] Phone: [phone]   Fax:  [phone]  Name: SHAHZAIB PRICHARD MRN: [id number] Date of Birth: 1960-03-13

## 2015-01-11 ENCOUNTER — Encounter: Payer: Self-pay | Admitting: Physical Therapy

## 2015-01-11 ENCOUNTER — Ambulatory Visit: Payer: Worker's Compensation | Admitting: Physical Therapy

## 2015-01-11 DIAGNOSIS — M25562 Pain in left knee: Secondary | ICD-10-CM | POA: Diagnosis not present

## 2015-01-11 NOTE — Therapy (Signed)
[name], [place], [address] Phone: [phone]   Fax:  [phone]  Physical Therapy Treatment  Patient Details  Name: Scott Hatfield MRN: [id number] Date of Birth: 1960-12-07 Referring Provider: Tamsen Roers MD.  Encounter Date: 01/11/2015      PT End of Session - 01/11/15 1517    Visit Number 5   Number of Visits 12   Date for PT Re-Evaluation 02/04/15   PT Start Time 1602   PT Stop Time 1630   PT Time Calculation (min) 28 min   Activity Tolerance Patient tolerated treatment well   Behavior During Therapy [facility] for tasks assessed/performed      Past Medical History  Diagnosis Date  . Overweight(278.02)   . Hypertension   . Hyperlipidemia, mixed   . CAD in native artery     Single-vessel CAD with stent placement to the circumflex coronary artery in 2008,  DES to Diag 10/12.  Cath 2016 RCA 25% stenosis, LAD 30% stenosis, ramus intermediate 45% stenosis, D1 95% stenosis treated with a drug-eluting stent  . GERD (gastroesophageal reflux disease)   . Sleep apnea     CPAP    Past Surgical History  Procedure Laterality Date  . Coronary stent placement  2008, 2012  . Cardiac catheterization N/A 07/03/2014    Procedure: Left Heart Cath and Coronary Angiography;  Surgeon: Peter M Martinique, MD;  Location: [place] [facility];  Service: Cardiovascular;  Laterality: N/A;  . Cardiac catheterization N/A 07/03/2014    Procedure: Coronary Stent Intervention;  Surgeon: Peter M Martinique, MD;  Location: [place] [facility];  Service: Cardiovascular;  Laterality: N/A;    There were no vitals filed for this visit.  Visit Diagnosis:  Left knee pain      Subjective Assessment - 01/11/15 1515    Subjective Reports that L knee aches at times but R knee is hurting more today. Requested no stationary bike today and similar treatment than what he recieved last treatment. Reports that Saturday he got in the floor at his home and  had to get his wife to get a chair in order to ascend from the floor.   Patient Stated Goals Get my knee back to normal.   Currently in Pain? Yes   Pain Score 3    Pain Location Knee   Pain Orientation Left   Pain Descriptors / Indicators Aching   Pain Onset More than a month ago   Pain Frequency Intermittent  When getting in and out of truck   Multiple Pain Sites Yes   Pain Score 8   Pain Location Knee   Pain Orientation Right   Pain Descriptors / Indicators Aching   Pain Frequency Constant            [facility] PT Assessment - 01/11/15 0001    Assessment   Medical Diagnosis Chondromalacia; left MCL sprain.   Onset Date/Surgical Date 09/28/14   Next MD Visit 01/15/2015   Precautions   Precautions None                     [facility] Adult PT Treatment/Exercise - 01/11/15 0001    Modalities   Modalities Electrical Stimulation;Vasopneumatic;Ultrasound   Electrical Stimulation   Electrical Stimulation Location L medial knee   Electrical Stimulation Action Pre-Mod   Electrical Stimulation Parameters 80-150 Hz x15 min   Electrical Stimulation Goals Pain   Ultrasound   Ultrasound Location L medial knee   Ultrasound Parameters  Combo 1.2 w/cm2, 100%, 1 mhz x10 min   Ultrasound Goals Pain   Vasopneumatic   Number Minutes Vasopneumatic  15 minutes   Vasopnuematic Location  Knee   Vasopneumatic Pressure Medium   Vasopneumatic Temperature  61                     PT Long Term Goals - 01/11/15 1527    PT LONG TERM GOAL #1   Title Ind with an HEP.   Time 4   Period Weeks   Status New   PT LONG TERM GOAL #2   Title Perform ADL's with pain not > 2-3/10.   Time 4   Period Weeks   Status Achieved   PT LONG TERM GOAL #3   Title Perform a flight of stairs in a reciprocating fashion with pain not > 2-3/10.   Status Achieved               Plan - 01/11/15 1519    Clinical Impression Statement Patient tolerated today's treatment wel with no complaints  of increased pain in L medial knee only reporting aching of R knee at rest. Normal modalities response noted following removal of the modalities. All active strengthening exercises were held today per patient request secondary to increased R knee pain. Achieved ADLs and stair gait LT goals today with answers according to L knee pain. Required increased time in order to stand following end of today's treatmnent secondary to R knee pain.  Denied L knee pain following today's treatment but continued to experience increased R knee pain.   Pt will benefit from skilled therapeutic intervention in order to improve on the following deficits Pain;Decreased activity tolerance   Rehab Potential Excellent   PT Frequency 3x / week   PT Duration 4 weeks   PT Treatment/Interventions ADLs/Self Care Home Management;Cryotherapy;Electrical Stimulation;Moist Heat;Therapeutic exercise;Therapeutic activities;Patient/family education;Manual techniques;Vasopneumatic Device;Ultrasound   PT Next Visit Plan Pain-free left quadriceps strengthening.  Stationary bike and modalities PRN.   Consulted and Agree with Plan of Care Patient        Problem List Patient Active Problem List   Diagnosis Date Noted  . Sleep apnea-on C-pap 07/04/2014  . Acute coronary syndrome ([place]) 07/04/2014  . Chest pain 07/03/2014  . Unstable angina ([place])   . PRECORDIAL PAIN 03/02/2010  . TOBACCO ABUSE 11/12/2008  . Dyslipidemia 10/07/2008  . OVERWEIGHT/OBESITY 10/07/2008  . Essential hypertension 10/07/2008  . CAD- S/P PCI '08, 2012, Dx DES 07/03/14 10/07/2008    Wynelle Fanny, PTA 01/11/2015, 3:35 PM  [name] [address] [place], [place], [address] Phone: [phone]   Fax:  [phone]  Name: Scott Hatfield MRN: [id number] Date of Birth: 07-14-1960

## 2015-01-14 ENCOUNTER — Encounter: Payer: Self-pay | Admitting: Physical Therapy

## 2015-01-14 ENCOUNTER — Ambulatory Visit: Payer: Worker's Compensation | Attending: Orthopedic Surgery | Admitting: Physical Therapy

## 2015-01-14 DIAGNOSIS — M25562 Pain in left knee: Secondary | ICD-10-CM | POA: Insufficient documentation

## 2015-01-14 NOTE — Therapy (Addendum)
[facility] [place], [place], [address] Phone: [phone]   Fax:  [phone]  Physical Therapy Treatment  Patient Details  Name: Scott Hatfield MRN: [id number] Date of Birth: [birth year] No Data Recorded  Encounter Date: 01/14/2015    Past Medical History:  Diagnosis Date  . CAD in native artery    a. 2008 - Single-vessel CAD with stent placement to the circumflex  b. 2012 - DES to Diag.  c. 06/2014 - RCA 25% stenosis, LAD 30% stenosis, ramus intermediate 45% stenosis, D1 95% stenosis treated with DES  d.01/2015 - nonobstructive CAD  . GERD (gastroesophageal reflux disease)   . Hyperlipidemia, mixed   . Hypertension   . Overweight(278.02)   . Sleep apnea    CPAP    Past Surgical History:  Procedure Laterality Date  . CARDIAC CATHETERIZATION N/A 07/03/2014   Procedure: Left Heart Cath and Coronary Angiography;  Surgeon: Peter M Martinique, MD;  Location: [place] [facility];  Service: Cardiovascular;  Laterality: N/A;  . CARDIAC CATHETERIZATION N/A 07/03/2014   Procedure: Coronary Stent Intervention;  Surgeon: Peter M Martinique, MD;  Location: [place] [facility];  Service: Cardiovascular;  Laterality: N/A;  . CARDIAC CATHETERIZATION N/A 01/26/2015   Procedure: Left Heart Cath and Coronary Angiography;  Surgeon: Peter M Martinique, MD;  Location: [place] [facility];  Service: Cardiovascular;  Laterality: N/A;  . CORONARY STENT PLACEMENT  2008, 2012    There were no vitals filed for this visit.  Visit Diagnosis:  Left knee pain                                    PT Long Term Goals - 01/11/15 1527      PT LONG TERM GOAL #1   Title Ind with an HEP.   Time 4   Period Weeks   Status New     PT LONG TERM GOAL #2   Title Perform ADL's with pain not > 2-3/10.   Time 4   Period Weeks   Status Achieved     PT LONG TERM GOAL #3   Title Perform a flight of stairs in a reciprocating fashion with  pain not > 2-3/10.   Status Achieved               Problem List Patient Active Problem List   Diagnosis Date Noted  . Coronary artery disease involving native coronary artery of native heart without angina pectoris 04/04/2016  . Encounter for immunization 04/04/2016  . Sleep apnea-on C-pap 07/04/2014  . Acute coronary syndrome ([place]) 07/04/2014  . Chest pain 07/03/2014  . Unstable angina ([place])   . PRECORDIAL PAIN 03/02/2010  . Encounter for smoking cessation counseling 11/12/2008  . Dyslipidemia 10/07/2008  . OVERWEIGHT/OBESITY 10/07/2008  . Essential hypertension 10/07/2008  . CAD- S/P PCI '08, 2012, Dx DES 07/03/14 10/07/2008    Ahmed Prima, PTA 07/21/16 2:50 PM Mali Applegate MPT [facility] [address] [place], [place], [address] Phone: [phone]   Fax:  [phone]  Name: Scott Hatfield MRN: [id number] Date of Birth: 1960/09/29  PHYSICAL THERAPY DISCHARGE SUMMARY  Visits from Start of Care:   Current functional level related to goals / functional outcomes: See above.   Remaining deficits: Goals essentially met.   Education / Equipment: HEP. Plan: Patient agrees to discharge.  Patient goals were partially met. Patient is being discharged  due to meeting the stated rehab goals.  ?????         Mali Applegate MPT

## 2015-01-25 ENCOUNTER — Emergency Department (HOSPITAL_COMMUNITY): Payer: BLUE CROSS/BLUE SHIELD

## 2015-01-25 ENCOUNTER — Encounter (HOSPITAL_COMMUNITY): Payer: Self-pay | Admitting: Family Medicine

## 2015-01-25 ENCOUNTER — Inpatient Hospital Stay (HOSPITAL_COMMUNITY)
Admission: EM | Admit: 2015-01-25 | Discharge: 2015-01-26 | DRG: 287 | Disposition: A | Discharge status: Home / Self Care | Payer: BLUE CROSS/BLUE SHIELD | Attending: Cardiovascular Disease | Admitting: Cardiovascular Disease

## 2015-01-25 DIAGNOSIS — Z955 Presence of coronary angioplasty implant and graft: Secondary | ICD-10-CM | POA: Diagnosis not present

## 2015-01-25 DIAGNOSIS — K219 Gastro-esophageal reflux disease without esophagitis: Secondary | ICD-10-CM | POA: Diagnosis present

## 2015-01-25 DIAGNOSIS — G4730 Sleep apnea, unspecified: Secondary | ICD-10-CM | POA: Diagnosis present

## 2015-01-25 DIAGNOSIS — Z87891 Personal history of nicotine dependence: Secondary | ICD-10-CM | POA: Diagnosis not present

## 2015-01-25 DIAGNOSIS — I10 Essential (primary) hypertension: Secondary | ICD-10-CM | POA: Diagnosis present

## 2015-01-25 DIAGNOSIS — E785 Hyperlipidemia, unspecified: Secondary | ICD-10-CM | POA: Diagnosis not present

## 2015-01-25 DIAGNOSIS — I25110 Atherosclerotic heart disease of native coronary artery with unstable angina pectoris: Secondary | ICD-10-CM | POA: Diagnosis not present

## 2015-01-25 DIAGNOSIS — I200 Unstable angina: Secondary | ICD-10-CM | POA: Diagnosis not present

## 2015-01-25 DIAGNOSIS — I119 Hypertensive heart disease without heart failure: Secondary | ICD-10-CM | POA: Diagnosis not present

## 2015-01-25 DIAGNOSIS — G4733 Obstructive sleep apnea (adult) (pediatric): Secondary | ICD-10-CM | POA: Diagnosis not present

## 2015-01-25 DIAGNOSIS — I2510 Atherosclerotic heart disease of native coronary artery without angina pectoris: Secondary | ICD-10-CM

## 2015-01-25 DIAGNOSIS — Z9861 Coronary angioplasty status: Secondary | ICD-10-CM | POA: Diagnosis not present

## 2015-01-25 DIAGNOSIS — E1159 Type 2 diabetes mellitus with other circulatory complications: Secondary | ICD-10-CM | POA: Diagnosis present

## 2015-01-25 LAB — TROPONIN I: Troponin I: <0.03 ng/mL (ref <0.031)

## 2015-01-25 LAB — BASIC METABOLIC PANEL
Anion gap: 8 (ref 5–15)
BUN: 11 mg/dL (ref 6–20)
CALCIUM: 8.9 mg/dL (ref 8.9–10.3)
CO2: 24 mmol/L (ref 22–32)
CREATININE: 0.86 mg/dL (ref 0.61–1.24)
Chloride: 106 mmol/L (ref 101–111)
Glucose, Bld: 104 mg/dL — ABNORMAL HIGH (ref 65–99)
Potassium: 4 mmol/L (ref 3.5–5.1)
SODIUM: 138 mmol/L (ref 135–145)

## 2015-01-25 LAB — CBC
HCT: 45.6 % (ref 39.0–52.0)
Hemoglobin: 14.6 g/dL (ref 13.0–17.0)
MCH: 30.5 pg (ref 26.0–34.0)
MCHC: 32 g/dL (ref 30.0–36.0)
MCV: 95.2 fL (ref 78.0–100.0)
PLATELETS: 225 10*3/uL (ref 150–400)
RBC: 4.79 MIL/uL (ref 4.22–5.81)
RDW: 12.7 % (ref 11.5–15.5)
WBC: 6.6 10*3/uL (ref 4.0–10.5)

## 2015-01-25 LAB — I-STAT TROPONIN, ED: TROPONIN I, POC: 0 ng/mL (ref 0.00–0.08)

## 2015-01-25 MED ORDER — CLOPIDOGREL BISULFATE 75 MG PO TABS
75.0000 mg | ORAL_TABLET | Freq: Every day | ORAL | Status: DC
  Administered 2015-01-26: 75 mg via ORAL
  Filled 2015-01-25: qty 1

## 2015-01-25 MED ORDER — ONDANSETRON HCL 4 MG/2ML IJ SOLN: 4.0000 mg | Freq: Four times a day (QID) | INTRAMUSCULAR | Status: DC | PRN

## 2015-01-25 MED ORDER — SODIUM CHLORIDE 0.9 % WEIGHT BASED INFUSION
3.0000 mL/kg/h | INTRAVENOUS | Status: DC
  Administered 2015-01-26: 3 mL/kg/h via INTRAVENOUS

## 2015-01-25 MED ORDER — SODIUM CHLORIDE 0.9 % IJ SOLN: 3.0000 mL | INTRAMUSCULAR | Status: DC | PRN

## 2015-01-25 MED ORDER — SODIUM CHLORIDE 0.9 % IV SOLN: 250.0000 mL | INTRAVENOUS | Status: DC | PRN

## 2015-01-25 MED ORDER — LISINOPRIL 40 MG PO TABS
40.0000 mg | ORAL_TABLET | Freq: Every day | ORAL | Status: DC
  Administered 2015-01-26: 40 mg via ORAL
  Filled 2015-01-25: qty 1

## 2015-01-25 MED ORDER — ATORVASTATIN CALCIUM 40 MG PO TABS: 40.0000 mg | ORAL_TABLET | Freq: Every day | ORAL | Status: DC

## 2015-01-25 MED ORDER — NITROGLYCERIN 0.4 MG SL SUBL: 0.4000 mg | SUBLINGUAL_TABLET | SUBLINGUAL | Status: DC | PRN

## 2015-01-25 MED ORDER — ASPIRIN 81 MG PO CHEW
81.0000 mg | CHEWABLE_TABLET | Freq: Every day | ORAL | Status: DC
  Administered 2015-01-26: 81 mg via ORAL
  Filled 2015-01-25: qty 1

## 2015-01-25 MED ORDER — HEPARIN (PORCINE) IN NACL 100-0.45 UNIT/ML-% IJ SOLN
2200.0000 [IU]/h | INTRAMUSCULAR | Status: DC
  Administered 2015-01-25: 1500 [IU]/h via INTRAVENOUS
  Administered 2015-01-26: 2000 [IU]/h via INTRAVENOUS
  Filled 2015-01-25 (×2): qty 250

## 2015-01-25 MED ORDER — HEPARIN BOLUS VIA INFUSION
4000.0000 [IU] | Freq: Once | INTRAVENOUS | Status: AC
  Administered 2015-01-25: 4000 [IU] via INTRAVENOUS
  Filled 2015-01-25: qty 4000

## 2015-01-25 MED ORDER — METOPROLOL TARTRATE 50 MG PO TABS
50.0000 mg | ORAL_TABLET | Freq: Two times a day (BID) | ORAL | Status: DC
  Administered 2015-01-25 – 2015-01-26 (×2): 50 mg via ORAL
  Filled 2015-01-25 (×2): qty 1

## 2015-01-25 MED ORDER — SODIUM CHLORIDE 0.9 % WEIGHT BASED INFUSION
1.0000 mL/kg/h | INTRAVENOUS | Status: DC
  Administered 2015-01-26: 1 mL/kg/h via INTRAVENOUS

## 2015-01-25 MED ORDER — ASPIRIN 81 MG PO CHEW
81.0000 mg | CHEWABLE_TABLET | ORAL | Status: AC
  Administered 2015-01-26: 81 mg via ORAL
  Filled 2015-01-25: qty 1

## 2015-01-25 MED ORDER — AMLODIPINE BESYLATE 5 MG PO TABS
5.0000 mg | ORAL_TABLET | Freq: Every day | ORAL | Status: DC
  Administered 2015-01-26: 5 mg via ORAL
  Filled 2015-01-25: qty 1

## 2015-01-25 MED ORDER — SODIUM CHLORIDE 0.9 % IJ SOLN
3.0000 mL | Freq: Two times a day (BID) | INTRAMUSCULAR | Status: DC
  Administered 2015-01-25: 3 mL via INTRAVENOUS

## 2015-01-25 MED ORDER — NITROGLYCERIN 0.4 MG SL SUBL
0.4000 mg | SUBLINGUAL_TABLET | Freq: Once | SUBLINGUAL | Status: AC
  Administered 2015-01-25: 0.4 mg via SUBLINGUAL
  Filled 2015-01-25: qty 1

## 2015-01-25 MED ORDER — ACETAMINOPHEN 325 MG PO TABS
650.0000 mg | ORAL_TABLET | ORAL | Status: DC | PRN
  Administered 2015-01-25: 650 mg via ORAL
  Filled 2015-01-25: qty 2

## 2015-01-25 MED ORDER — NITROGLYCERIN IN D5W 200-5 MCG/ML-% IV SOLN
5.0000 ug/min | INTRAVENOUS | Status: DC
  Administered 2015-01-25: 5 ug/min via INTRAVENOUS
  Filled 2015-01-25: qty 250

## 2015-01-25 NOTE — H&P (Signed)
Patient ID: LOTT BALADEZ MRN: [id number], DOB/AGE: July 22, 1960   Admit date: 01/25/2015   Primary Physician: Octavio Graves, DO Primary Cardiologist: Dr. Percival Spanish   [id number] C Imming is a 54 y.o. male with past medical history of CAD (s/p PCI to Cx in 2008, DES to Diagonal in 2012, recent cath in 06/2014 showing restenosis in Diagonal and treated with DES), HTN, HLD, and OSA (on CPAP) who presents to [facility] ED on 01/25/2015 for evaluation of chest pain.  Patient reports right arm pain awoke him from sleep around midnight overnight. The pain was an a 8/10 in intensity. The pain waxed and waned overnight but never fully resolved. This morning while walking around his home, the arm pain began to radiate to his central chest and he experienced chest tightness. He took a SL NTG and says this relieved his pain. He denies any associated nausea, vomiting, dyspnea with exertion, or diaphoresis. Reports feeling "jittery" yesterday but denies any recent anginal symptoms prior to what he experienced this morning. He reports the right arm pain is representative of his angina and the exact symptom he experienced when requiring previous stents.  While in the ED, his initial troponin has been negative. CBC and BMET are normal. CXR shows no acute changes. His EKG shows NSR with t-wave flattening when compared to previous tracings. At the time of his examination, he reports still having 3/10 pain. He has been SL NTG while in the ED which provides him temporary relief.  Reports tobacco abstinence since May 2016. Reports good compliance with his medications. Rarely misses a dose.   Home Medications  Prior to Admission medications   Medication Sig Start Date End Date Taking? Authorizing Provider  amLODipine (NORVASC) 5 MG tablet Take 1 tablet (5 mg total) by mouth daily. 11/04/14  Yes Minus Breeding, MD  aspirin 81 MG tablet Take 81 mg by mouth daily.     Yes Historical Provider, MD  atorvastatin  (LIPITOR) 40 MG tablet TAKE ONE TABLET BY MOUTH ONCE DAILY 07/27/14  Yes Minus Breeding, MD  clopidogrel (PLAVIX) 75 MG tablet Take 1 tablet (75 mg total) by mouth daily. 08/19/14  Yes Minus Breeding, MD  diclofenac (VOLTAREN) 75 MG EC tablet Take 1 tablet (75 mg total) by mouth 2 (two) times daily. 11/05/14  Yes Tiffany A Gann, PA-C  hydrochlorothiazide (MICROZIDE) 12.5 MG capsule Take 2 capsules (25 mg total) by mouth daily. 07/04/14  Yes Luke K Kilroy, PA-C  lisinopril (PRINIVIL,ZESTRIL) 40 MG tablet Take 1 tablet (40 mg total) by mouth daily. 06/16/14  Yes Minus Breeding, MD  metoprolol (LOPRESSOR) 50 MG tablet Take 1 tablet (50 mg total) by mouth 2 (two) times daily. 04/27/14  Yes Minus Breeding, MD  nitroGLYCERIN (NITROSTAT) 0.4 MG SL tablet Place 1 tablet (0.4 mg total) under the tongue every 5 (five) minutes as needed. May repeat for up to 3 doses. 05/13/14  Yes Minus Breeding, MD  predniSONE (STERAPRED UNI-PAK 21 TAB) 10 MG (21) TBPK tablet 6 pills PO on day 1, 5 on day 2, 4 on day 3, 3 on day 4, 2 on day 5, 1 on day 6 Patient not taking: Reported on 01/25/2015 11/05/14   Tiffany A Gann, PA-C  sildenafil (VIAGRA) 50 MG tablet Take 1 tablet (50 mg total) by mouth daily as needed for erectile dysfunction (Do not take if you are taking Nitroglycerin). 07/04/14   Erlene Quan, PA-C  varenicline (CHANTIX) 1 MG tablet Take 1 mg by mouth daily.  Historical Provider, MD    Allergies No Known Allergies  Past Medical History Past Medical History  Diagnosis Date  . Overweight(278.02)   . Hypertension   . Hyperlipidemia, mixed   . CAD in native artery     Single-vessel CAD with stent placement to the circumflex coronary artery in 2008,  DES to Diag 10/12.  Cath 2016 RCA 25% stenosis, LAD 30% stenosis, ramus intermediate 45% stenosis, D1 95% stenosis treated with a drug-eluting stent  . GERD (gastroesophageal reflux disease)   . Sleep apnea     CPAP     Surgical History   Past Surgical History    Procedure Laterality Date  . Coronary stent placement  2008, 2012  . Cardiac catheterization N/A 07/03/2014    Procedure: Left Heart Cath and Coronary Angiography;  Surgeon: Peter M Martinique, MD;  Location: [place] [facility];  Service: Cardiovascular;  Laterality: N/A;  . Cardiac catheterization N/A 07/03/2014    Procedure: Coronary Stent Intervention;  Surgeon: Peter M Martinique, MD;  Location: [place] [facility];  Service: Cardiovascular;  Laterality: N/A;     Family History Family History  Problem Relation Age of Onset  . Coronary artery disease Other   . Heart disease Mother     Social History Social History   Social History  . Marital Status: Married    Spouse Name: N/A  . Number of Children: N/A  . Years of Education: N/A   Occupational History  . Full time    Social History Main Topics  . Smoking status: Former Smoker    Start date: 03/20/2012  . Smokeless tobacco: Not on file  . Alcohol Use: 0.0 oz/week    0 Standard drinks or equivalent per week  . Drug Use: No  . Sexual Activity: Not on file   Other Topics Concern  . Not on file   Social History Narrative   Married   No regular exercise     Review of Systems General:  No chills, fever, night sweats or weight changes.  Cardiovascular:  No dyspnea on exertion, edema, orthopnea, palpitations, paroxysmal nocturnal dyspnea. Positive for chest pain and radiating arm pain. Dermatological: No rash, lesions/masses Respiratory: No cough, dyspnea Urologic: No hematuria, dysuria Abdominal:   No nausea, vomiting, diarrhea, bright red blood per rectum, melena, or hematemesis Neurologic:  No visual changes, wkns, changes in mental status. All other systems reviewed and are otherwise negative except as noted above.   Physical Exam: Blood pressure 135/82, pulse 77, temperature 97.6 F (36.4 C), temperature source Oral, resp. rate 15, height 6' (1.829 m), weight 296 lb (134.265 kg), SpO2 97 %.  General: Well developed,  obese Caucasian,male in no acute distress. Head: Normocephalic, atraumatic, sclera non-icteric, no xanthomas, nares are without discharge. Dentition:  Neck: No carotid bruits. JVD not elevated.  Lungs: Respirations regular and unlabored, without wheezes or rales.  Heart: Regular rate and rhythm. No S3 or S4.  No murmur, no rubs, or gallops appreciated. Abdomen: Soft, non-tender, non-distended with normoactive bowel sounds. No hepatomegaly. No rebound/guarding. No obvious abdominal masses. Msk:  Strength and tone appear normal for age. No joint deformities or effusions. Extremities: No clubbing or cyanosis. No edema.  Distal pedal pulses are 2+ bilaterally. Neuro: Alert and oriented X 3. Moves all extremities spontaneously. No focal deficits noted. Psych:  Responds to questions appropriately with a normal affect. Skin: No rashes or lesions noted   Labs Lab Results  Component Value Date   WBC 6.6 01/25/2015  HGB 14.6 01/25/2015   HCT 45.6 01/25/2015   MCV 95.2 01/25/2015   PLT 225 01/25/2015     Recent Labs Lab 01/25/15 1116  NA 138  K 4.0  CL 106  CO2 24  BUN 11  CREATININE 0.86  CALCIUM 8.9  GLUCOSE 104*   Troponin (Point of Care Test)  Recent Labs  01/25/15 1113  TROPIPOC 0.00    Lab Results  Component Value Date   CHOL 128 07/04/2014   HDL 30* 07/04/2014   LDLCALC 73 07/04/2014   TRIG 127 07/04/2014   ECG: NSR with rate in 80's. Flattened T-waves noted, most prominent in lateral leads.   Radiology/Studies: Dg Chest 2 View: 01/25/2015  CLINICAL DATA:  Chest pain and tingling in the arm beginning today. EXAM: CHEST - 2 VIEW COMPARISON:  One-view chest x-ray 07/03/2014. FINDINGS: The heart is enlarged. There is no edema or effusion to suggest failure. Mild bibasilar airspace disease likely reflects atelectasis. No focal airspace disease is evident. Mild degenerative changes are noted in the thoracic spine. IMPRESSION: 1. Mild cardiomegaly without failure. 2. Mild  bibasilar airspace disease likely reflects atelectasis. Electronically Signed   By: San Morelle M.D.   On: 01/25/2015 11:34   ASSESSMENT AND PLAN:  1. Unstable angina ([facility]) - history of CAD s/p PCI to Cx in 2008, DES to Diagonal in 2012, recent cath in 06/2014 showing restenosis in Diagonal and treated with DES - started experiencing radiating pain down right arm at midnight which awoke him from sleep. Then began to radiate to his chest later this morning. Representative of previous angina. Relieved with SL NTG. - initial troponin negative. Will cycle cardiac enzymes. - Continue ASA, statin, Plavix, ACE-I, and BB. Start NTG drip. - Will start Heparin per pharmacy consult.  - Plan for [facility] tomorrow. NPO after midnight.  2. Dyslipidemia - continue statin  3. Essential hypertension - BP has been 133/81 - 158/102 while in the ED - continue ACE-I and Amlodipine. Hold HCTZ in preparation for cath. - Continue to monitor, especially while on NTG drip  4. Sleep apnea - CPAP at night   Signed, Erma Heritage, PA-C 01/25/2015, 4:55 PM Pager: [phone]   Attending Note:   The patient was seen and examined.  Agree with assessment and plan as noted above.  Changes made to the above note as needed.  Pt presents with angina pain for the past 17 + hours.   Very similar to his previous angina. Has new T wave flattening compared to previous tracing in June. Troponins are still negative.  I was able to get his CP relieved with a 3rd SL NTG but the pain returned. Will start IV NTG and heparin tonight . Have put him on the cath schedule for tomorrow   Discussed the risks, benefits, options.   He understands and agrees to proceed  Thayer Headings, Brooke Bonito., MD, [facility] 01/25/2015, 5:07 PM [address],  [place] Pager [phone]

## 2015-01-25 NOTE — Procedures (Signed)
Pt placed on CPAP and is tolerating well.

## 2015-01-25 NOTE — ED Notes (Signed)
Pt here for chest pain radiating to left arm that started about 12:30 am. sts took 1 nitro with little relief. Pt sig cardiac hx. St some SOB.

## 2015-01-25 NOTE — Progress Notes (Signed)
ANTICOAGULATION CONSULT NOTE - Initial Consult  Pharmacy Consult for Heparin Indication: chest pain/ACS  No Known Allergies  Patient Measurements: Height: 6' (182.9 cm) Weight: 296 lb (134.265 kg) (stated) IBW/kg (Calculated) : 77.6 Heparin Dosing Weight: 108 kg  Vital Signs: Temp: 97.6 F (36.4 C) (12/12 1046) Temp Source: Oral (12/12 1046) BP: 135/82 mmHg (12/12 1600) Pulse Rate: 77 (12/12 1600)  Labs:  Recent Labs  01/25/15 1116  HGB 14.6  HCT 45.6  PLT 225  CREATININE 0.86    CrCl cannot be calculated (Unknown ideal weight.).   Medical History: Past Medical History  Diagnosis Date  . Overweight(278.02)   . Hypertension   . Hyperlipidemia, mixed   . CAD in native artery     Single-vessel CAD with stent placement to the circumflex coronary artery in 2008,  DES to Diag 10/12.  Cath 2016 RCA 25% stenosis, LAD 30% stenosis, ramus intermediate 45% stenosis, D1 95% stenosis treated with a drug-eluting stent  . GERD (gastroesophageal reflux disease)   . Sleep apnea     CPAP    Medications:   (Not in a hospital admission) Scheduled:   Infusions:  . nitroGLYCERIN      Assessment: 54yo male with history of CAD on plavix/ASA, HTN, HLD and OSA presents with CP. Pharmacy is consulted to dose heparin for ACS/chest pain. CBC wnl, sCr 0.86, trop neg x1.  Goal of Therapy:  Heparin level 0.3-0.7 units/ml Monitor platelets by anticoagulation protocol: Yes   Plan:  Give 4000 units bolus x 1 Start heparin infusion at 1500 units/hr Check anti-Xa level in 6 hours and daily while on heparin Continue to monitor H&H and platelets  Andrey Cota. Diona Foley, PharmD, [place] Clinical Pharmacist Pager [phone] 01/25/2015,4:45 PM

## 2015-01-25 NOTE — ED Provider Notes (Signed)
CSN: [id number]     Arrival date & time 01/25/15  1040 History   First MD Initiated Contact with Patient 01/25/15 1329     Chief Complaint  Patient presents with  . Chest Pain    HPI [name] is a 54 year old gentleman with PMH of CAD w/ stent to LCx in 2008, STEMI s/p DES to Diag. In 2012, Cardiac Cath with 95% stenosis of 1st Diagonal s/p DES in 06/2014, HTN, HLD, OSA on CPAP, and GERD who presents with left sided arm and chest pain. Pain began about 12:30 am, waking him from sleep. It started in his left upper arm as an aching "tooth ache" sensation that was constant and 8/10 in intensity. It radiated to his left chest as a burning/cramping/sore/pressure-like sensation that comes and goes. He had some associated shortness of breath and feelings like his heart was racing. He denies any radiation to his neck, jaw, or back. He denies diaphoresis, nausea, vomiting, headache, change in vision, numbness/tingling, lightheadedness, or dizziness. He took one sublingual nitro which provided some relief. He is on Aspirin and Plavix daily. He also took his regular medications today. He says this is similar to the pain he had in May 2016 at which time he underwent cardiac cath and had a stent placed for unstable angina. He is a former smoker (quit 06/2014) and reports occasional alcohol.    Past Medical History  Diagnosis Date  . Overweight(278.02)   . Hypertension   . Hyperlipidemia, mixed   . CAD in native artery     Single-vessel CAD with stent placement to the circumflex coronary artery in 2008,  DES to Diag 10/12.  Cath 2016 RCA 25% stenosis, LAD 30% stenosis, ramus intermediate 45% stenosis, D1 95% stenosis treated with a drug-eluting stent  . GERD (gastroesophageal reflux disease)   . Sleep apnea     CPAP   Past Surgical History  Procedure Laterality Date  . Coronary stent placement  2008, 2012  . Cardiac catheterization N/A 07/03/2014    Procedure: Left Heart Cath and Coronary  Angiography;  Surgeon: Peter M Martinique, MD;  Location: [facility];  Service: Cardiovascular;  Laterality: N/A;  . Cardiac catheterization N/A 07/03/2014    Procedure: Coronary Stent Intervention;  Surgeon: Peter M Martinique, MD;  Location: [facility];  Service: Cardiovascular;  Laterality: N/A;   Family History  Problem Relation Age of Onset  . Coronary artery disease Other   . Heart disease Mother    Social History  Substance Use Topics  . Smoking status: Former Smoker    Start date: 03/20/2012  . Smokeless tobacco: None  . Alcohol Use: 0.0 oz/week    0 Standard drinks or equivalent per week    Review of Systems  Constitutional: Negative for fever, chills and diaphoresis.  Eyes: Negative for visual disturbance.  Respiratory: Positive for chest tightness and shortness of breath.   Cardiovascular: Positive for chest pain, palpitations and leg swelling.  Gastrointestinal: Negative for nausea, vomiting, abdominal pain, diarrhea, constipation and blood in stool.  Genitourinary: Negative for dysuria and hematuria.  Musculoskeletal: Negative for neck pain.  Neurological: Positive for tremors. Negative for dizziness, weakness, light-headedness, numbness and headaches.      Allergies  Review of patient's allergies indicates no known allergies.  Home Medications   Prior to Admission medications   Medication Sig Start Date End Date Taking? Authorizing Provider  amLODipine (NORVASC) 5 MG tablet Take 1 tablet (5 mg total) by  mouth daily. 11/04/14  Yes Minus Breeding, MD  aspirin 81 MG tablet Take 81 mg by mouth daily.     Yes Historical Provider, MD  atorvastatin (LIPITOR) 40 MG tablet TAKE ONE TABLET BY MOUTH ONCE DAILY 07/27/14  Yes Minus Breeding, MD  clopidogrel (PLAVIX) 75 MG tablet Take 1 tablet (75 mg total) by mouth daily. 08/19/14  Yes Minus Breeding, MD  diclofenac (VOLTAREN) 75 MG EC tablet Take 1 tablet (75 mg total) by mouth 2 (two) times daily. 11/05/14  Yes Tiffany  A Gann, PA-C  hydrochlorothiazide (MICROZIDE) 12.5 MG capsule Take 2 capsules (25 mg total) by mouth daily. 07/04/14  Yes Luke K Kilroy, PA-C  lisinopril (PRINIVIL,ZESTRIL) 40 MG tablet Take 1 tablet (40 mg total) by mouth daily. 06/16/14  Yes Minus Breeding, MD  metoprolol (LOPRESSOR) 50 MG tablet Take 1 tablet (50 mg total) by mouth 2 (two) times daily. 04/27/14  Yes Minus Breeding, MD  nitroGLYCERIN (NITROSTAT) 0.4 MG SL tablet Place 1 tablet (0.4 mg total) under the tongue every 5 (five) minutes as needed. May repeat for up to 3 doses. 05/13/14  Yes Minus Breeding, MD  predniSONE (STERAPRED UNI-PAK 21 TAB) 10 MG (21) TBPK tablet 6 pills PO on day 1, 5 on day 2, 4 on day 3, 3 on day 4, 2 on day 5, 1 on day 6 Patient not taking: Reported on 01/25/2015 11/05/14   Tiffany A Gann, PA-C  sildenafil (VIAGRA) 50 MG tablet Take 1 tablet (50 mg total) by mouth daily as needed for erectile dysfunction (Do not take if you are taking Nitroglycerin). 07/04/14   Erlene Quan, PA-C  varenicline (CHANTIX) 1 MG tablet Take 1 mg by mouth daily.    Historical Provider, MD   BP 135/82 mmHg  Pulse 77  Temp(Src) 97.6 F (36.4 C) (Oral)  Resp 15  Ht 6' (1.829 m)  Wt 134.265 kg  BMI 40.14 kg/m2  SpO2 97% Physical Exam  Constitutional: He is oriented to person, place, and time. He appears well-developed and well-nourished. No distress.  HENT:  Head: Normocephalic and atraumatic.  Neck: Carotid bruit is not present.  Cardiovascular: Normal rate, regular rhythm and intact distal pulses.   No murmur heard. Pulmonary/Chest: Effort normal. No respiratory distress. He has no wheezes. He has no rales. He exhibits no tenderness.  Abdominal: Soft. Bowel sounds are normal. There is no tenderness.  Musculoskeletal: He exhibits edema. He exhibits no tenderness.  Pitting edema bilateral lower extremities  Neurological: He is alert and oriented to person, place, and time.  Skin: Skin is warm. He is not diaphoretic.   Psychiatric: He has a normal mood and affect.    ED Course  Procedures (including critical care time) Labs Review Labs Reviewed  BASIC METABOLIC PANEL - Abnormal; Notable for the following:    Glucose, Bld 104 (*)    All other components within normal limits  CBC  TROPONIN I  HEPARIN LEVEL (UNFRACTIONATED)  CBC  HEPARIN LEVEL (UNFRACTIONATED)  I-STAT TROPOININ, ED    Imaging Review Dg Chest 2 View  01/25/2015  CLINICAL DATA:  Chest pain and tingling in the arm beginning today. EXAM: CHEST - 2 VIEW COMPARISON:  One-view chest x-ray 07/03/2014. FINDINGS: The heart is enlarged. There is no edema or effusion to suggest failure. Mild bibasilar airspace disease likely reflects atelectasis. No focal airspace disease is evident. Mild degenerative changes are noted in the thoracic spine. IMPRESSION: 1. Mild cardiomegaly without failure. 2. Mild bibasilar airspace disease likely reflects  atelectasis. Electronically Signed   By: San Morelle M.D.   On: 01/25/2015 11:34   I have personally reviewed and evaluated these images and lab results as part of my medical decision-making.   EKG Interpretation   Date/Time:  Monday January 25 2015 10:45:08 EST Ventricular Rate:  83 PR Interval:  160 QRS Duration: 82 QT Interval:  368 QTC Calculation: 432 R Axis:   48 Text Interpretation:  Normal sinus rhythm Nonspecific T wave abnormality  Abnormal ECG agree. no change Confirmed by Johnney Killian, MD, Jeannie Done [phone]) on  01/25/2015 2:28:31 PM      MDM   Final diagnoses:  Unstable angina [facility])  [name] is a 54 year old gentleman with PMH of CAD w/ stent to LCx in 2008, STEMI s/p DES to Diag. In 2012, Cardiac Cath with 95% stenosis of 1st Diagonal s/p DES in 06/2014, HTN, HLD, OSA on CPAP, and GERD who presents with left sided arm and chest pain. Symptoms similar to unstable anginal episode he had in May 2016 and cardiac cath done at that time with DES placed in 1st Diagonal. He took  one nitro at home which provided some relief. His PCP is Dr. Octavio Graves and his Cardiologist is Dr. Minus Breeding.  Initial I-stat troponin negative and EKG without acute ischemic changes. CXR showing mild cardiomegaly without failure and mild bibasilar airspace favored to be atelectasis. Likely unstable angina. Will obtain 2nd Troponin, give another Nitro, and ask Cardiology to see patient. Blood pressures have been stable at 130s/80s with normal heart rate and O2 saturation on room air.  Chest pain with some improvement after second Nitro. Discussed with Cardiology who have recommended to start IV Nitrogycerin and IV Heparin for likely cath tomorrow to evaluate for possible stent restenosis.     Zada Finders, MD 01/25/15 1713  Charlesetta Shanks, MD 01/26/15 [phone]

## 2015-01-26 ENCOUNTER — Encounter (HOSPITAL_COMMUNITY): Payer: Self-pay | Admitting: Cardiology

## 2015-01-26 ENCOUNTER — Encounter (HOSPITAL_COMMUNITY)
Admission: EM | Disposition: A | Discharge status: Home / Self Care | Payer: Self-pay | Source: Home / Self Care | Attending: Cardiovascular Disease

## 2015-01-26 DIAGNOSIS — E785 Hyperlipidemia, unspecified: Secondary | ICD-10-CM

## 2015-01-26 DIAGNOSIS — Z9861 Coronary angioplasty status: Secondary | ICD-10-CM

## 2015-01-26 DIAGNOSIS — I25110 Atherosclerotic heart disease of native coronary artery with unstable angina pectoris: Principal | ICD-10-CM

## 2015-01-26 DIAGNOSIS — I2510 Atherosclerotic heart disease of native coronary artery without angina pectoris: Secondary | ICD-10-CM

## 2015-01-26 HISTORY — PX: CARDIAC CATHETERIZATION: SHX172

## 2015-01-26 LAB — BASIC METABOLIC PANEL
Anion gap: 8 (ref 5–15)
BUN: 14 mg/dL (ref 6–20)
CALCIUM: 8.5 mg/dL — AB (ref 8.9–10.3)
CO2: 25 mmol/L (ref 22–32)
CREATININE: 0.96 mg/dL (ref 0.61–1.24)
Chloride: 106 mmol/L (ref 101–111)
GFR calc non Af Amer: >60 mL/min (ref >60)
Glucose, Bld: 124 mg/dL — ABNORMAL HIGH (ref 65–99)
Potassium: 4 mmol/L (ref 3.5–5.1)
SODIUM: 139 mmol/L (ref 135–145)

## 2015-01-26 LAB — TROPONIN I: Troponin I: <0.03 ng/mL (ref <0.031)

## 2015-01-26 LAB — CBC
HEMATOCRIT: 43.8 % (ref 39.0–52.0)
Hemoglobin: 14.1 g/dL (ref 13.0–17.0)
MCH: 31.2 pg (ref 26.0–34.0)
MCHC: 32.2 g/dL (ref 30.0–36.0)
MCV: 96.9 fL (ref 78.0–100.0)
PLATELETS: 211 10*3/uL (ref 150–400)
RBC: 4.52 MIL/uL (ref 4.22–5.81)
RDW: 12.9 % (ref 11.5–15.5)
WBC: 7.1 10*3/uL (ref 4.0–10.5)

## 2015-01-26 LAB — LIPID PANEL
Cholesterol: 136 mg/dL (ref 0–200)
HDL: 32 mg/dL — ABNORMAL LOW (ref >40)
LDL CALC: 78 mg/dL (ref 0–99)
TRIGLYCERIDES: 130 mg/dL (ref <150)
Total CHOL/HDL Ratio: 4.3 RATIO
VLDL: 26 mg/dL (ref 0–40)

## 2015-01-26 LAB — PROTIME-INR
INR: 1.01 (ref 0.00–1.49)
Prothrombin Time: 13.5 seconds (ref 11.6–15.2)

## 2015-01-26 LAB — HEPARIN LEVEL (UNFRACTIONATED)
HEPARIN UNFRACTIONATED: 0.27 [IU]/mL — AB (ref 0.30–0.70)
Heparin Unfractionated: <0.1 IU/mL — ABNORMAL LOW (ref 0.30–0.70)

## 2015-01-26 SURGERY — LEFT HEART CATH AND CORONARY ANGIOGRAPHY

## 2015-01-26 MED ORDER — MIDAZOLAM HCL 2 MG/2ML IJ SOLN
INTRAMUSCULAR | Status: AC
  Filled 2015-01-26: qty 2

## 2015-01-26 MED ORDER — SODIUM CHLORIDE 0.9 % IJ SOLN: 3.0000 mL | Freq: Two times a day (BID) | INTRAMUSCULAR | Status: DC

## 2015-01-26 MED ORDER — HEPARIN SODIUM (PORCINE) 1000 UNIT/ML IJ SOLN
INTRAMUSCULAR | Status: AC
  Filled 2015-01-26: qty 1

## 2015-01-26 MED ORDER — FENTANYL CITRATE (PF) 100 MCG/2ML IJ SOLN
INTRAMUSCULAR | Status: AC
  Filled 2015-01-26: qty 2

## 2015-01-26 MED ORDER — SODIUM CHLORIDE 0.9 % WEIGHT BASED INFUSION: 1.0000 mL/kg/h | INTRAVENOUS | Status: AC

## 2015-01-26 MED ORDER — HEPARIN BOLUS VIA INFUSION
3000.0000 [IU] | Freq: Once | INTRAVENOUS | Status: AC
  Administered 2015-01-26: 3000 [IU] via INTRAVENOUS
  Filled 2015-01-26: qty 3000

## 2015-01-26 MED ORDER — HEPARIN SODIUM (PORCINE) 1000 UNIT/ML IJ SOLN
INTRAMUSCULAR | Status: DC | PRN
  Administered 2015-01-26: 6000 [IU] via INTRAVENOUS

## 2015-01-26 MED ORDER — MIDAZOLAM HCL 2 MG/2ML IJ SOLN
INTRAMUSCULAR | Status: DC | PRN
  Administered 2015-01-26: 2 mg via INTRAVENOUS

## 2015-01-26 MED ORDER — HEPARIN (PORCINE) IN NACL 2-0.9 UNIT/ML-% IJ SOLN
INTRAMUSCULAR | Status: DC | PRN
  Administered 2015-01-26: 12:00:00

## 2015-01-26 MED ORDER — LIDOCAINE HCL (PF) 1 % IJ SOLN
INTRAMUSCULAR | Status: AC
  Filled 2015-01-26: qty 30

## 2015-01-26 MED ORDER — IOHEXOL 350 MG/ML SOLN
INTRAVENOUS | Status: DC | PRN
  Administered 2015-01-26: 100 mL via INTRAVENOUS

## 2015-01-26 MED ORDER — VERAPAMIL HCL 2.5 MG/ML IV SOLN
INTRAVENOUS | Status: AC
  Filled 2015-01-26: qty 2

## 2015-01-26 MED ORDER — LIDOCAINE HCL (PF) 1 % IJ SOLN
INTRAMUSCULAR | Status: DC | PRN
  Administered 2015-01-26: 2 mL

## 2015-01-26 MED ORDER — VERAPAMIL HCL 2.5 MG/ML IV SOLN
INTRAVENOUS | Status: DC | PRN
  Administered 2015-01-26: 10 mL via INTRA_ARTERIAL

## 2015-01-26 MED ORDER — SODIUM CHLORIDE 0.9 % IJ SOLN: 3.0000 mL | INTRAMUSCULAR | Status: DC | PRN

## 2015-01-26 MED ORDER — SODIUM CHLORIDE 0.9 % IV SOLN: 250.0000 mL | INTRAVENOUS | Status: DC | PRN

## 2015-01-26 MED ORDER — HEPARIN (PORCINE) IN NACL 2-0.9 UNIT/ML-% IJ SOLN
INTRAMUSCULAR | Status: AC
  Filled 2015-01-26: qty 1000

## 2015-01-26 MED ORDER — FENTANYL CITRATE (PF) 100 MCG/2ML IJ SOLN
INTRAMUSCULAR | Status: DC | PRN
  Administered 2015-01-26: 25 ug via INTRAVENOUS

## 2015-01-26 SURGICAL SUPPLY — 12 items
CATH INFINITI 5 FR JL3.5 (CATHETERS) ×2 IMPLANT
CATH INFINITI 5FR AL1 (CATHETERS) ×1 IMPLANT
CATH INFINITI 5FR ANG PIGTAIL (CATHETERS) ×2 IMPLANT
CATH INFINITI JR4 5F (CATHETERS) ×2 IMPLANT
DEVICE RAD COMP TR BAND LRG (VASCULAR PRODUCTS) ×2 IMPLANT
GLIDESHEATH SLEND SS 6F .021 (SHEATH) ×2 IMPLANT
KIT HEART LEFT (KITS) ×2 IMPLANT
PACK CARDIAC CATHETERIZATION (CUSTOM PROCEDURE TRAY) ×2 IMPLANT
SYR MEDRAD MARK V 150ML (SYRINGE) ×2 IMPLANT
TRANSDUCER W/STOPCOCK (MISCELLANEOUS) ×2 IMPLANT
TUBING CIL FLEX 10 FLL-RA (TUBING) ×2 IMPLANT
WIRE SAFE-T 1.5MM-J .035X260CM (WIRE) ×2 IMPLANT

## 2015-01-26 NOTE — H&P (View-Only) (Signed)
Patient Name: Scott Hatfield Date of Encounter: 01/26/2015  Principal Problem:   Unstable angina [facility]) Active Problems:   Dyslipidemia   Essential hypertension   CAD- S/P PCI '08, 2012, Dx DES 07/03/14   Sleep apnea-on C-pap    Primary Cardiologist: Dr. Percival Spanish Patient Profile: 54 y.o. male w/ PMH of CAD (s/p PCI to Cx in 2008, DES to Diagonal in 2012, recent cath in 06/2014 showing restenosis in Diagonal and treated with DES), HTN, HLD, and OSA (on CPAP) admitted on 01/25/2015 for unstable angina.  SUBJECTIVE: Denies any chest pain or radiating arm pain overnight. Reports mild dyspnea this AM since being taken off of his CPAP.   OBJECTIVE Filed Vitals:   01/26/15 0317 01/26/15 0417 01/26/15 0517 01/26/15 0759  BP: 129/91 135/89 134/92   Pulse:      Temp:  97.9 F (36.6 C)  97.7 F (36.5 C)  TempSrc:  Oral  Oral  Resp: 16 16 16    Height:      Weight:  292 lb 6.4 oz (132.632 kg)    SpO2:  97%      Intake/Output Summary (Last 24 hours) at 01/26/15 0805 Last data filed at 01/26/15 0748  Gross per 24 hour  Intake 1379.57 ml  Output   1600 ml  Net -220.43 ml   Filed Weights   01/25/15 1601 01/25/15 1824 01/26/15 0417  Weight: 296 lb (134.265 kg) 289 lb 3.2 oz (131.18 kg) 292 lb 6.4 oz (132.632 kg)    PHYSICAL EXAM General: Well developed, obese male in no acute distress. Head: Normocephalic, atraumatic.  Neck: Supple without bruits, JVD not elevated. Lungs:  Resp regular and unlabored, CTA without wheezing or rales. Heart: RRR, S1, S2, no S3, S4, or murmur; no rub. Abdomen: Soft, non-tender, non-distended with normoactive bowel sounds. No hepatomegaly. No rebound/guarding. No obvious abdominal masses. Extremities: No clubbing, cyanosis, or edema. Distal pedal pulses are 2+ bilaterally. Neuro: Alert and oriented X 3. Moves all extremities spontaneously. Psych: Normal affect.   LABS: CBC: Recent Labs  01/25/15 1116 01/26/15 0546  WBC 6.6 7.1  HGB 14.6  14.1  HCT 45.6 43.8  MCV 95.2 96.9  PLT 225 211   INR: Recent Labs  01/26/15 0546  INR [id number]   Basic Metabolic Panel: Recent Labs  01/25/15 1116 01/26/15 0546  NA 138 139  K 4.0 4.0  CL 106 106  CO2 24 25  GLUCOSE 104* 124*  BUN 11 14  CREATININE 0.86 0.96  CALCIUM 8.9 8.5*   Cardiac Enzymes: Recent Labs  01/25/15 2003 01/25/15 2340 01/26/15 0546  TROPONINI <0.03 <0.03 <0.03    Recent Labs  01/25/15 1113  TROPIPOC 0.00   BNP:  B NATRIURETIC PEPTIDE  Date/Time Value Ref Range Status  07/03/2014 12:51 PM 18.9 0.0 - 100.0 pg/mL Final   Fasting Lipid Panel: Recent Labs  01/26/15 0621  CHOL 136  HDL 32*  LDLCALC 78  TRIG 130  CHOLHDL 4.3   TELE:  NSR with rate in 60's - 80's. No atopic events.      Radiology/Studies: Dg Chest 2 View: 01/25/2015  CLINICAL DATA:  Chest pain and tingling in the arm beginning today. EXAM: CHEST - 2 VIEW COMPARISON:  One-view chest x-ray 07/03/2014. FINDINGS: The heart is enlarged. There is no edema or effusion to suggest failure. Mild bibasilar airspace disease likely reflects atelectasis. No focal airspace disease is evident. Mild degenerative changes are noted in the thoracic spine. IMPRESSION: 1. Mild cardiomegaly without  failure. 2. Mild bibasilar airspace disease likely reflects atelectasis. Electronically Signed   By: San Morelle M.D.   On: 01/25/2015 11:34    Current Medications:  . amLODipine  5 mg Oral Daily  . aspirin  81 mg Oral Daily  . atorvastatin  40 mg Oral q1800  . clopidogrel  75 mg Oral Daily  . lisinopril  40 mg Oral Daily  . metoprolol  50 mg Oral BID  . sodium chloride  3 mL Intravenous Q12H   . sodium chloride 1 mL/kg/hr (01/26/15 0600)  . heparin 2,000 Units/hr (01/26/15 0418)  . nitroGLYCERIN 5 mcg/min (01/25/15 1701)    ASSESSMENT AND PLAN:  1. Unstable angina (HCC) - history of CAD s/p PCI to Cx in 2008, DES to Diagonal in 2012, recent cath in 06/2014 showing restenosis in Diagonal  and treated with DES. On ASA and Plavix since. - started experiencing radiating pain down right arm at midnight which awoke him from sleep. Then began to radiate to his chest later this morning. Representative of previous angina. Relieved with SL NTG. EKG shows new T-wave flattening. - cyclic troponin values have been negative. - Continue ASA, statin, Plavix, ACE-I, BB, Heparin, and NTG drip. - LHC planned for today.  2. Dyslipidemia - LDL 78, HDL 32, Total 136 - continue statin  3. Essential hypertension - BP has been 102/66 - 158/102 in the past 24 hours. - continue ACE-I and Amlodipine. Hold HCTZ in preparation for cath.  4. Sleep apnea - CPAP at night   Signed, Erma Heritage , [place] 8:05 AM 01/26/2015 Pager: [phone]   Attending Note:   The patient was seen and examined.  Agree with assessment and plan as noted above.  Changes made to the above note as needed.  For cath today .   ECG may be slightly improved.  Troponins are negative.      Thayer Headings, Brooke Bonito., MD, [facility] 01/26/2015, 10:02 AM [address],  [place] Pager [phone]

## 2015-01-26 NOTE — Discharge Summary (Signed)
CARDIOLOGY DISCHARGE SUMMARY   Patient ID: Scott Hatfield MRN: [id number] DOB/AGE: 02/25/1960 54 y.o.  Admit date: 01/25/2015 Discharge date: 01/26/2015  PCP: Octavio Graves, DO Primary Cardiologist: Dr. Percival Spanish  Primary Discharge Diagnosis: Unstable angina [facility]) Secondary Discharge Diagnosis: Dyslipidemia, Essential hypertension, CAD- S/P PCI '08, 2012, Dx DES 07/03/14, Sleep apnea-on C-pap  Consults: None  Procedures: Left Heart Catheterization, Coronary Angiography  Hospital Course: Scott Hatfield is a 54 y.o. male with past medical history of CAD (s/p PCI to Cx in 2008, DES to Diagonal in 2012, recent cath in 06/2014 showing restenosis in Diagonal and treated with DES), HTN, HLD, and OSA (on CPAP) who presented to [facility] ED on 01/25/2015 for evaluation of chest pain.  He reported having left arm pain the previous night which awoke him from sleep. The pain was an 8/10 in intensity at its worse but waxed and waned overnight, never fully resolving. He developed central chest tightness that morning and took a SL NTG with which he experienced relief. He reported the left arm pain was representative of his angina and the exact symptom he experienced when requiring previous stents.  His initial troponin value was negative and his EKG showed NSR with t-wave flattening when compared to previous tracings. At the time of his examination, he was still having 3/10 pain and was therefore started on IV Heparin and a NTG drip. It was recommended he undergo cardiac catheterization to further evaluate his symptoms. The risks and benefits of the procedure were explained in detail and he agreed to proceed. The cath was scheduled for the following day.  Overnight, he denied any repeat chest pain or arm pain.  He did report some dyspnea since coming off his CPAP that morning. Cyclic troponin values were negative. His catheterization showed nonobstructive CAD with patent stents in the first  diagonal and distal LCx. Continued medical therapy was recommended. The full report is included below.  He tolerated the procedure well without any complications. His right radial cath site was without ecchymosis and no evidence of a hematoma or bruit. He will ambulate down the hallway prior to going home.  The patient was last examined by Dr. Cathie Olden and deemed stable for discharge. He wishes to follow-up with Dr. Percival Spanish in [place] for his yearly follow-up next fall instead of having a closer follow-up. Will call with questions or concerns if any arise prior to that appointment. Reports having plenty of SL NTG at home and not needing a refill at this time.  Labs:   Lab Results  Component Value Date   WBC 7.1 01/26/2015   HGB 14.1 01/26/2015   HCT 43.8 01/26/2015   MCV 96.9 01/26/2015   PLT 211 01/26/2015     Recent Labs Lab 01/26/15 0546  NA 139  K 4.0  CL 106  CO2 25  BUN 14  CREATININE 0.96  CALCIUM 8.5*  GLUCOSE 124*    Recent Labs  01/25/15 2003 01/25/15 2340 01/26/15 0546  TROPONINI <0.03 <0.03 <0.03   Lipid Panel     Component Value Date/Time   CHOL 136 01/26/2015 0621   TRIG 130 01/26/2015 0621   HDL 32* 01/26/2015 0621   CHOLHDL 4.3 01/26/2015 0621   VLDL 26 01/26/2015 0621   LDLCALC 78 01/26/2015 0621    Recent Labs  01/26/15 0546  INR 1.01      Radiology: Dg Chest 2 View: 01/25/2015  CLINICAL DATA:  Chest pain and tingling in the arm beginning today.  EXAM: CHEST - 2 VIEW COMPARISON:  One-view chest x-ray 07/03/2014. FINDINGS: The heart is enlarged. There is no edema or effusion to suggest failure. Mild bibasilar airspace disease likely reflects atelectasis. No focal airspace disease is evident. Mild degenerative changes are noted in the thoracic spine. IMPRESSION: 1. Mild cardiomegaly without failure. 2. Mild bibasilar airspace disease likely reflects atelectasis. Electronically Signed   By: San Morelle M.D.   On: 01/25/2015 11:34     Cardiac Cath: 01/26/2015  Prox RCA lesion, 25% stenosed.  Prox LAD lesion, 30% stenosed.  Ramus lesion, 45% stenosed.  Dist Cx lesion, 10% stenosed. The lesion was previously treated with a stent (unknown type).  The left ventricular systolic function is normal.  1. Nonobstructive CAD. 2. Patent stents in the first diagonal and distal LCx 3. Normal LV function  Plan: continue medical therapy.   EKG: NSR with rate in 80's. Flattened T-waves noted, most prominent in lateral leads.   FOLLOW UP PLANS AND APPOINTMENTS No Known Allergies   Medication List    STOP taking these medications        predniSONE 10 MG (21) Tbpk tablet  Commonly known as:  STERAPRED UNI-PAK 21 TAB      TAKE these medications        amLODipine 5 MG tablet  Commonly known as:  NORVASC  Take 1 tablet (5 mg total) by mouth daily.     aspirin 81 MG tablet  Take 81 mg by mouth daily.     atorvastatin 40 MG tablet  Commonly known as:  LIPITOR  TAKE ONE TABLET BY MOUTH ONCE DAILY     clopidogrel 75 MG tablet  Commonly known as:  PLAVIX  Take 1 tablet (75 mg total) by mouth daily.     diclofenac 75 MG EC tablet  Commonly known as:  VOLTAREN  Take 1 tablet (75 mg total) by mouth 2 (two) times daily.     hydrochlorothiazide 12.5 MG capsule  Commonly known as:  MICROZIDE  Take 2 capsules (25 mg total) by mouth daily.     lisinopril 40 MG tablet  Commonly known as:  PRINIVIL,ZESTRIL  Take 1 tablet (40 mg total) by mouth daily.     metoprolol 50 MG tablet  Commonly known as:  LOPRESSOR  Take 1 tablet (50 mg total) by mouth 2 (two) times daily.     nitroGLYCERIN 0.4 MG SL tablet  Commonly known as:  NITROSTAT  Place 1 tablet (0.4 mg total) under the tongue every 5 (five) minutes as needed. May repeat for up to 3 doses.     sildenafil 50 MG tablet  Commonly known as:  VIAGRA  Take 1 tablet (50 mg total) by mouth daily as needed for erectile dysfunction (Do not take if you are taking  Nitroglycerin).     varenicline 1 MG tablet  Commonly known as:  CHANTIX  Take 1 mg by mouth daily.         Follow-up Information    Follow up with Minus Breeding, MD.   Specialty:  Cardiology   Why:  Yearly Follow-Up. If needed before or with complications concerning your recent catheterization, please call [facility] at [phone].   Contact information:   [place] [address] [phone]       BRING ALL MEDICATIONS WITH YOU TO FOLLOW UP APPOINTMENTS  Time spent with patient to include physician time: 40 minutes  Signed: Erma Heritage, PA 01/26/2015, 3:25 PM Co-Sign MD  Attending Note:   The patient was seen and examined.  Agree with assessment and plan as noted above.  Changes made to the above note as needed.  Cath showed no obstructive disease.  OK for Dc troponins are negative.  Follow up with Dr Percival Spanish .   Thayer Headings, Brooke Bonito., MD, [facility] 01/26/2015, 4:45 PM [address]  [place] Pager [phone]

## 2015-01-26 NOTE — Progress Notes (Signed)
Pt discharged home with wife.  Reviewed discharge instructions and education, all questions answered.  Assessment unchanged from earlier.  

## 2015-01-26 NOTE — Plan of Care (Signed)
Problem: Education: Goal: Knowledge of  General Education information/materials will improve Outcome: Completed/Met Date Met:  01/26/15 Educated pt on new medications, current medications, plan of care, and pain management.  Pt verbalizes understanding and no questions at this time  Problem: Safety: Goal: Ability to remain free from injury will improve Outcome: Completed/Met Date Met:  01/26/15 Pt understands the importance of remaining safe while in the hospital and calling out for assistance  Problem: Tissue Perfusion: Goal: Risk factors for ineffective tissue perfusion will decrease Outcome: Completed/Met Date Met:  01/26/15 Pt was educated on VTE prophylaxis and states he understands.

## 2015-01-26 NOTE — Plan of Care (Signed)
Problem: Consults Goal: Cardiac Cath Patient Education (See Patient Education module for education specifics.)  Outcome: Completed/Met Date Met:  01/26/15 Gave pt information regarding cath, all questions answered  Problem: Phase I Progression Outcomes Goal: Pain controlled with appropriate interventions Outcome: Completed/Met Date Met:  01/26/15 Pt chest pain is controlled  Goal: Voiding-avoid urinary catheter unless indicated Outcome: Completed/Met Date Met:  01/26/15 Pt able to void on own Goal: Vascular site scale level 0 - I Vascular Site Scale Level 0: No bruising/bleeding/hematoma Level I (Mild): Bruising/Ecchymosis, minimal bleeding/ooozing, palpable hematoma < 3 cm Level II (Moderate): Bleeding not affecting hemodynamic parameters, pseudoaneurysm, palpable hematoma > 3 cm Level III (Severe) Bleeding which affects hemodynamic parameters or retroperitoneal hemorrhage  Outcome: Completed/Met Date Met:  01/26/15 Site a level 0

## 2015-01-26 NOTE — Interval H&P Note (Signed)
History and Physical Interval Note:  01/26/2015 11:42 AM  Scott Hatfield  has presented today for surgery, with the diagnosis of cp  The various methods of treatment have been discussed with the patient and family. After consideration of risks, benefits and other options for treatment, the patient has consented to  Procedure(s): Left Heart Cath and Coronary Angiography (N/A) as a surgical intervention .  The patient's history has been reviewed, patient examined, no change in status, stable for surgery.  I have reviewed the patient's chart and labs.  Questions were answered to the patient's satisfaction.    Cath Lab Visit (complete for each Cath Lab visit)  Clinical Evaluation Leading to the Procedure:   ACS: Yes.    Non-ACS:    Anginal Classification: CCS IV  Anti-ischemic medical therapy: Maximal Therapy (2 or more classes of medications)  Non-Invasive Test Results: No non-invasive testing performed  Prior CABG: No previous CABG       [name] [facility] 01/26/2015 11:42 AM

## 2015-01-26 NOTE — Progress Notes (Signed)
[place] for Heparin Indication: chest pain/ACS  No Known Allergies  Patient Measurements: Height: 6' (182.9 cm) Weight: 289 lb 3.2 oz (131.18 kg) IBW/kg (Calculated) : 77.6 Heparin Dosing Weight: 108 kg  Vital Signs: Temp: 98.1 F (36.7 C) (12/12 2355) Temp Source: Oral (12/12 2355) BP: 132/81 mmHg (12/12 2017) Pulse Rate: 87 (12/12 2049)  Labs:  Recent Labs  01/25/15 1116 01/25/15 1640 01/25/15 2003 01/25/15 2340  HGB 14.6  --   --   --   HCT 45.6  --   --   --   PLT 225  --   --   --   HEPARINUNFRC  --   --   --  <0.10*  CREATININE 0.86  --   --   --   TROPONINI  --  <0.03 <0.03  --     Estimated Creatinine Clearance: 137.5 mL/min (by C-G formula based on Cr of 0.86).  Assessment: 54 y.o. male with chest pain for heparin   Goal of Therapy:  Heparin level 0.3-0.7 units/ml Monitor platelets by anticoagulation protocol: Yes   Plan: Heparin 3000 units IV bolus, then increase heparin 2000 units/hr Follow-up am labs.  Phillis Knack, PharmD, BCPS     01/26/2015,12:13 AM

## 2015-01-26 NOTE — Progress Notes (Signed)
UR Completed [facility]ouinard Graves-Bigelow, RN,BSN [phone]  

## 2015-01-26 NOTE — Discharge Instructions (Signed)

## 2015-01-26 NOTE — Progress Notes (Signed)
Patient Name: Scott Hatfield Date of Encounter: 01/26/2015  Principal Problem:   Unstable angina [facility]) Active Problems:   Dyslipidemia   Essential hypertension   CAD- S/P PCI '08, 2012, Dx DES 07/03/14   Sleep apnea-on C-pap    Primary Cardiologist: Dr. Percival Spanish Patient Profile: 54 y.o. male w/ PMH of CAD (s/p PCI to Cx in 2008, DES to Diagonal in 2012, recent cath in 06/2014 showing restenosis in Diagonal and treated with DES), HTN, HLD, and OSA (on CPAP) admitted on 01/25/2015 for unstable angina.  SUBJECTIVE: Denies any chest pain or radiating arm pain overnight. Reports mild dyspnea this AM since being taken off of his CPAP.   OBJECTIVE Filed Vitals:   01/26/15 0317 01/26/15 0417 01/26/15 0517 01/26/15 0759  BP: 129/91 135/89 134/92   Pulse:      Temp:  97.9 F (36.6 C)  97.7 F (36.5 C)  TempSrc:  Oral  Oral  Resp: 16 16 16    Height:      Weight:  292 lb 6.4 oz (132.632 kg)    SpO2:  97%      Intake/Output Summary (Last 24 hours) at 01/26/15 0805 Last data filed at 01/26/15 0748  Gross per 24 hour  Intake 1379.57 ml  Output   1600 ml  Net -220.43 ml   Filed Weights   01/25/15 1601 01/25/15 1824 01/26/15 0417  Weight: 296 lb (134.265 kg) 289 lb 3.2 oz (131.18 kg) 292 lb 6.4 oz (132.632 kg)    PHYSICAL EXAM General: Well developed, obese male in no acute distress. Head: Normocephalic, atraumatic.  Neck: Supple without bruits, JVD not elevated. Lungs:  Resp regular and unlabored, CTA without wheezing or rales. Heart: RRR, S1, S2, no S3, S4, or murmur; no rub. Abdomen: Soft, non-tender, non-distended with normoactive bowel sounds. No hepatomegaly. No rebound/guarding. No obvious abdominal masses. Extremities: No clubbing, cyanosis, or edema. Distal pedal pulses are 2+ bilaterally. Neuro: Alert and oriented X 3. Moves all extremities spontaneously. Psych: Normal affect.   LABS: CBC: Recent Labs  01/25/15 1116 01/26/15 0546  WBC 6.6 7.1  HGB 14.6  14.1  HCT 45.6 43.8  MCV 95.2 96.9  PLT 225 211   INR: Recent Labs  01/26/15 0546  INR [id number]   Basic Metabolic Panel: Recent Labs  01/25/15 1116 01/26/15 0546  NA 138 139  K 4.0 4.0  CL 106 106  CO2 24 25  GLUCOSE 104* 124*  BUN 11 14  CREATININE 0.86 0.96  CALCIUM 8.9 8.5*   Cardiac Enzymes: Recent Labs  01/25/15 2003 01/25/15 2340 01/26/15 0546  TROPONINI <0.03 <0.03 <0.03    Recent Labs  01/25/15 1113  TROPIPOC 0.00   BNP:  B NATRIURETIC PEPTIDE  Date/Time Value Ref Range Status  07/03/2014 12:51 PM 18.9 0.0 - 100.0 pg/mL Final   Fasting Lipid Panel: Recent Labs  01/26/15 0621  CHOL 136  HDL 32*  LDLCALC 78  TRIG 130  CHOLHDL 4.3   TELE:  NSR with rate in 60's - 80's. No atopic events.      Radiology/Studies: Dg Chest 2 View: 01/25/2015  CLINICAL DATA:  Chest pain and tingling in the arm beginning today. EXAM: CHEST - 2 VIEW COMPARISON:  One-view chest x-ray 07/03/2014. FINDINGS: The heart is enlarged. There is no edema or effusion to suggest failure. Mild bibasilar airspace disease likely reflects atelectasis. No focal airspace disease is evident. Mild degenerative changes are noted in the thoracic spine. IMPRESSION: 1. Mild cardiomegaly without  failure. 2. Mild bibasilar airspace disease likely reflects atelectasis. Electronically Signed   By: San Morelle M.D.   On: 01/25/2015 11:34    Current Medications:  . amLODipine  5 mg Oral Daily  . aspirin  81 mg Oral Daily  . atorvastatin  40 mg Oral q1800  . clopidogrel  75 mg Oral Daily  . lisinopril  40 mg Oral Daily  . metoprolol  50 mg Oral BID  . sodium chloride  3 mL Intravenous Q12H   . sodium chloride 1 mL/kg/hr (01/26/15 0600)  . heparin 2,000 Units/hr (01/26/15 0418)  . nitroGLYCERIN 5 mcg/min (01/25/15 1701)    ASSESSMENT AND PLAN:  1. Unstable angina (HCC) - history of CAD s/p PCI to Cx in 2008, DES to Diagonal in 2012, recent cath in 06/2014 showing restenosis in Diagonal  and treated with DES. On ASA and Plavix since. - started experiencing radiating pain down right arm at midnight which awoke him from sleep. Then began to radiate to his chest later this morning. Representative of previous angina. Relieved with SL NTG. EKG shows new T-wave flattening. - cyclic troponin values have been negative. - Continue ASA, statin, Plavix, ACE-I, BB, Heparin, and NTG drip. - LHC planned for today.  2. Dyslipidemia - LDL 78, HDL 32, Total 136 - continue statin  3. Essential hypertension - BP has been 102/66 - 158/102 in the past 24 hours. - continue ACE-I and Amlodipine. Hold HCTZ in preparation for cath.  4. Sleep apnea - CPAP at night   Signed, Erma Heritage , [place] 8:05 AM 01/26/2015 Pager: [phone]   Attending Note:   The patient was seen and examined.  Agree with assessment and plan as noted above.  Changes made to the above note as needed.  For cath today .   ECG may be slightly improved.  Troponins are negative.      Thayer Headings, Brooke Bonito., MD, [facility] 01/26/2015, 10:02 AM [address]  [place] Pager [phone]

## 2015-01-26 NOTE — Progress Notes (Signed)
[place] for Heparin Indication: chest pain/ACS  No Known Allergies  Patient Measurements: Height: 6' (182.9 cm) Weight: 292 lb 6.4 oz (132.632 kg) IBW/kg (Calculated) : 77.6 Heparin Dosing Weight: 108 kg  Vital Signs: Temp: 97.7 F (36.5 C) (12/13 0759) Temp Source: Oral (12/13 0759) BP: 134/92 mmHg (12/13 0517)  Labs:  Recent Labs  01/25/15 1116  01/25/15 2003 01/25/15 2340 01/26/15 0546  HGB 14.6  --   --   --  14.1  HCT 45.6  --   --   --  43.8  PLT 225  --   --   --  211  LABPROT  --   --   --   --  13.5  INR  --   --   --   --  1.01  HEPARINUNFRC  --   --   --  <0.10* 0.27*  CREATININE 0.86  --   --   --  0.96  TROPONINI  --   < > <0.03 <0.03 <0.03  < > = values in this interval not displayed.  Estimated Creatinine Clearance: 123.9 mL/min (by C-G formula based on Cr of 0.96).  Assessment: 54 yo male on heparin gtt for possible NSTEMI. S/p recent cath and PCI in May of this year. Renal fx and CBC stable.  Goal of Therapy:  Heparin level 0.3-0.7 units/ml Monitor platelets by anticoagulation protocol: Yes   Plan: -Increase heparin gtt to 2200 units/hr -Daily HL, CBC -F/u after cath today     Hughes Better, PharmD, BCPS Clinical Pharmacist Pager: [phone] 01/26/2015 10:13 AM

## 2015-01-26 NOTE — Plan of Care (Signed)
Problem: Consults Goal: Cardiac Cath Patient Education (See Patient Education module for education specifics.)  Outcome: Progressing Pt and wife aware of risks and benefits of catheterization. Pt refused to watch video stating that he has had heart caths done in the past and is aware of what this procedure entails Goal: Tobacco Cessation referral if indicated Outcome: Completed/Met Date Met:  01/26/15 Pt former smoker, quit 8 months ago

## 2015-04-19 ENCOUNTER — Encounter: Payer: Self-pay | Admitting: Gastroenterology

## 2015-05-09 ENCOUNTER — Other Ambulatory Visit: Payer: Self-pay | Admitting: Cardiology

## 2015-05-10 NOTE — Telephone Encounter (Signed)
Rx request sent to pharmacy.  

## 2015-06-09 ENCOUNTER — Encounter: Payer: Self-pay | Admitting: Gastroenterology

## 2015-06-11 ENCOUNTER — Encounter: Payer: Self-pay | Admitting: Gastroenterology

## 2015-06-18 ENCOUNTER — Other Ambulatory Visit: Payer: Self-pay | Admitting: Cardiology

## 2015-06-18 NOTE — Telephone Encounter (Signed)
Rx Refill

## 2015-06-21 ENCOUNTER — Other Ambulatory Visit: Payer: Self-pay | Admitting: Cardiology

## 2015-06-21 NOTE — Telephone Encounter (Signed)
Rx(s) sent to pharmacy electronically.  

## 2015-07-25 ENCOUNTER — Other Ambulatory Visit: Payer: Self-pay | Admitting: Cardiology

## 2015-07-26 NOTE — Telephone Encounter (Signed)
Rx(s) sent to pharmacy electronically.  

## 2015-07-30 ENCOUNTER — Ambulatory Visit: Payer: Self-pay | Admitting: Gastroenterology

## 2015-08-17 ENCOUNTER — Other Ambulatory Visit: Payer: Self-pay | Admitting: Cardiology

## 2015-08-26 ENCOUNTER — Other Ambulatory Visit: Payer: Self-pay | Admitting: Cardiology

## 2015-08-26 MED ORDER — ATORVASTATIN CALCIUM 40 MG PO TABS: 40.0000 mg | ORAL_TABLET | Freq: Every day | ORAL | Status: DC

## 2015-08-26 MED ORDER — LISINOPRIL 40 MG PO TABS: 40.0000 mg | ORAL_TABLET | Freq: Every day | ORAL | Status: DC

## 2015-08-26 NOTE — Telephone Encounter (Signed)
plavix refilled #[id number] with 0 refills Rx(s) sent to pharmacy electronically - lisinopril, atorvastatin Patient has appt in [facility]

## 2015-08-26 NOTE — Telephone Encounter (Signed)
°*  STAT* If patient is at the pharmacy, call can be transferred to refill team.   1. Which medications need to be refilled? (please list name of each medication and dose if known) Clopidogrel 75mg , lisinopril,40mg , and Atorvastatin 40mg ** needs new prescription    2. Which pharmacy/location (including street and city if local pharmacy) is medication to be sent to?[facility] in [place]   3. Do they need a 30 day or 90 day supply? [place]

## 2015-09-11 ENCOUNTER — Other Ambulatory Visit: Payer: Self-pay | Admitting: Cardiology

## 2015-09-13 NOTE — Telephone Encounter (Signed)
REFILL 

## 2015-10-04 NOTE — Progress Notes (Signed)
HPI Patient presents for evaluation of coronary artery disease. He had a history of circumflex stenting in another town in 2008. He presented urgently in 10/12 with unstable angina and ST segment elevation and was found to have an occluded diagonal. He had a drug-eluting stent placed.  He was in the hospital in May of last year with unstable angina and was found to have 95% stenosis in the diagonal again treated with a drug-eluting stent.  He was again in the hospital in Dec of last year.   He again had chest pain but cath at that time demonstrated non obstructive disease with patent stents.  He was managed medically.  This is his first visit since that appt.  He says that he is doing well.  He is not exercising but he does work around his yard.  The patient denies any new symptoms such as chest discomfort, neck or arm discomfort. There has been no new shortness of breath, PND or orthopnea. There have been no reported palpitations, presyncope or syncope.   No Known Allergies  Current Outpatient Prescriptions  Medication Sig Dispense Refill  . amLODipine (NORVASC) 5 MG tablet Take 1 tablet (5 mg total) by mouth daily. 30 tablet 11  . aspirin 81 MG tablet Take 81 mg by mouth daily.      Marland Kitchen atorvastatin (LIPITOR) 40 MG tablet Take 1 tablet (40 mg total) by mouth daily at 6 PM. 90 tablet 0  . clopidogrel (PLAVIX) 75 MG tablet Take 1 tablet (75 mg total) by mouth daily. KEEP OV. 90 tablet 0  . diclofenac (VOLTAREN) 75 MG EC tablet Take 1 tablet (75 mg total) by mouth 2 (two) times daily. 30 tablet 0  . hydrochlorothiazide (MICROZIDE) 12.5 MG capsule Take 2 capsules (25 mg total) by mouth daily. <PLEASE MAKE APPOINTMENT FOR REFILLS> 180 capsule 0  . lisinopril (PRINIVIL,ZESTRIL) 40 MG tablet Take 1 tablet (40 mg total) by mouth daily. 90 tablet 0  . nitroGLYCERIN (NITROSTAT) 0.4 MG SL tablet Place 1 tablet (0.4 mg total) under the tongue every 5 (five) minutes as needed. May repeat for up to 3 doses. 25  tablet prn  . metoprolol (LOPRESSOR) 50 MG tablet Take 1 tablet (50 mg total) by mouth 2 (two) times daily. 60 tablet 0   No current facility-administered medications for this visit.     Past Medical History:  Diagnosis Date  . CAD in native artery    a. 2008 - Single-vessel CAD with stent placement to the circumflex  b. 2012 - DES to Diag.  c. 06/2014 - RCA 25% stenosis, LAD 30% stenosis, ramus intermediate 45% stenosis, D1 95% stenosis treated with DES  d.01/2015 - nonobstructive CAD  . GERD (gastroesophageal reflux disease)   . Hyperlipidemia, mixed   . Hypertension   . Overweight(278.02)   . Sleep apnea    CPAP    Past Surgical History:  Procedure Laterality Date  . CARDIAC CATHETERIZATION N/A 07/03/2014   Procedure: Left Heart Cath and Coronary Angiography;  Surgeon: Peter M Martinique, MD;  Location: [place] [facility];  Service: Cardiovascular;  Laterality: N/A;  . CARDIAC CATHETERIZATION N/A 07/03/2014   Procedure: Coronary Stent Intervention;  Surgeon: Peter M Martinique, MD;  Location: [place] CV [facility];  Service: Cardiovascular;  Laterality: N/A;  . CARDIAC CATHETERIZATION N/A 01/26/2015   Procedure: Left Heart Cath and Coronary Angiography;  Surgeon: Peter M Martinique, MD;  Location: [place] CV [facility];  Service: Cardiovascular;  Laterality: N/A;  . CORONARY  STENT PLACEMENT  2008, 2012   ROS: As stated in the HPI and negative for all other systems.  PHYSICAL EXAM BP (!) 141/93   Pulse 92   Ht 6' (1.829 m)   Wt 276 lb (125.2 kg)   BMI 37.43 kg/m  GENERAL:  Well appearing NECK:  No jugular venous distention, waveform within normal limits, carotid upstroke brisk and symmetric, no bruits, no thyromegaly LYMPHATICS:  No cervical, inguinal adenopathy LUNGS:  Clear to auscultation bilaterally BACK:  No CVA tenderness CHEST:  Unremarkable HEART:  PMI not displaced or sustained,S1 and S2 within normal limits, no S3, no S4, no clicks, no rubs, no murmurs ABD:  Flat, positive bowel  sounds normal in frequency in pitch, no bruits, no rebound, no guarding, no midline pulsatile mass, no hepatomegaly, no splenomegaly, obese EXT:  2 plus pulses throughout, trace bilateral leg edema, no cyanosis no clubbing  EKG:  Sinus rhythm, rate 89, axis within normal limits, intervals within normal limits, no acute ST-T wave changes.  10/06/2015   ASSESSMENT AND PLAN   CAD, NATIVE VESSEL -  He has no unstable symptoms. He will continue with risk reduction. No further cardiac testing testing is suggested.  He needs continue risk reduction.   HYPERTENSION, UNSPECIFIED -  His blood pressure is OK.  I had to reduce Norvasc in the past because of leg swelling.  No change in meds is planned.   HYPERLIPIDEMIA-MIXED -  He will continue on the meds as listed.  This is followed by CYNTHIA BUTLER, DO.  The labs from last year were within target and I reviewed these again with him .   TOBACCO ABUSE -  He is still not smoking.   OVERWEIGHT/OBESITY -  We have discussed this at length at previous visits.  He is working on this.  I addressed this again today. . We talked in detail today about an exercise plan.  He is limited by some knee pain but he will look into trying his brother's stationary bike.  SLEEP APNEA -  He feels much better on CPAP.    EDEMA - No further workup is planned. This is improved.  FATIGUE - This is improved.  He has had a new sleep mask

## 2015-10-06 ENCOUNTER — Encounter: Payer: Self-pay | Admitting: Cardiology

## 2015-10-06 ENCOUNTER — Ambulatory Visit (INDEPENDENT_AMBULATORY_CARE_PROVIDER_SITE_OTHER): Payer: BLUE CROSS/BLUE SHIELD | Admitting: Cardiology

## 2015-10-06 VITALS — BP 141/93 | HR 92 | Ht 72.0 in | Wt 276.0 lb

## 2015-10-06 DIAGNOSIS — I10 Essential (primary) hypertension: Secondary | ICD-10-CM

## 2015-10-06 DIAGNOSIS — I2510 Atherosclerotic heart disease of native coronary artery without angina pectoris: Secondary | ICD-10-CM

## 2015-10-06 MED ORDER — METOPROLOL TARTRATE 50 MG PO TABS: 50.0000 mg | ORAL_TABLET | Freq: Two times a day (BID) | ORAL | 11 refills | Status: DC

## 2015-10-06 NOTE — Patient Instructions (Signed)
Medication Instructions:  Please take medications as listed.  Follow-Up: Follow up in 1 year with Dr. Percival Spanish.  You will receive a letter in the mail 2 months before you are due.  Please call us when you receive this letter to schedule your follow up appointment.  If you need a refill on your cardiac medications before your next appointment, please call your pharmacy.  Thank you for choosing [facility]!!

## 2015-11-22 ENCOUNTER — Other Ambulatory Visit: Payer: Self-pay | Admitting: Cardiology

## 2015-12-07 ENCOUNTER — Other Ambulatory Visit: Payer: Self-pay | Admitting: Cardiology

## 2016-01-04 ENCOUNTER — Other Ambulatory Visit: Payer: Self-pay | Admitting: Cardiology

## 2016-02-14 IMAGING — DX DG CHEST 2V
2 series · 2 of 2 positions shown · non-contrast
Comparison: One-view chest x-ray 07/03/2014.

CLINICAL DATA: Chest pain and tingling in the arm beginning today.

EXAM:
CHEST - 2 VIEW

[chest pa]
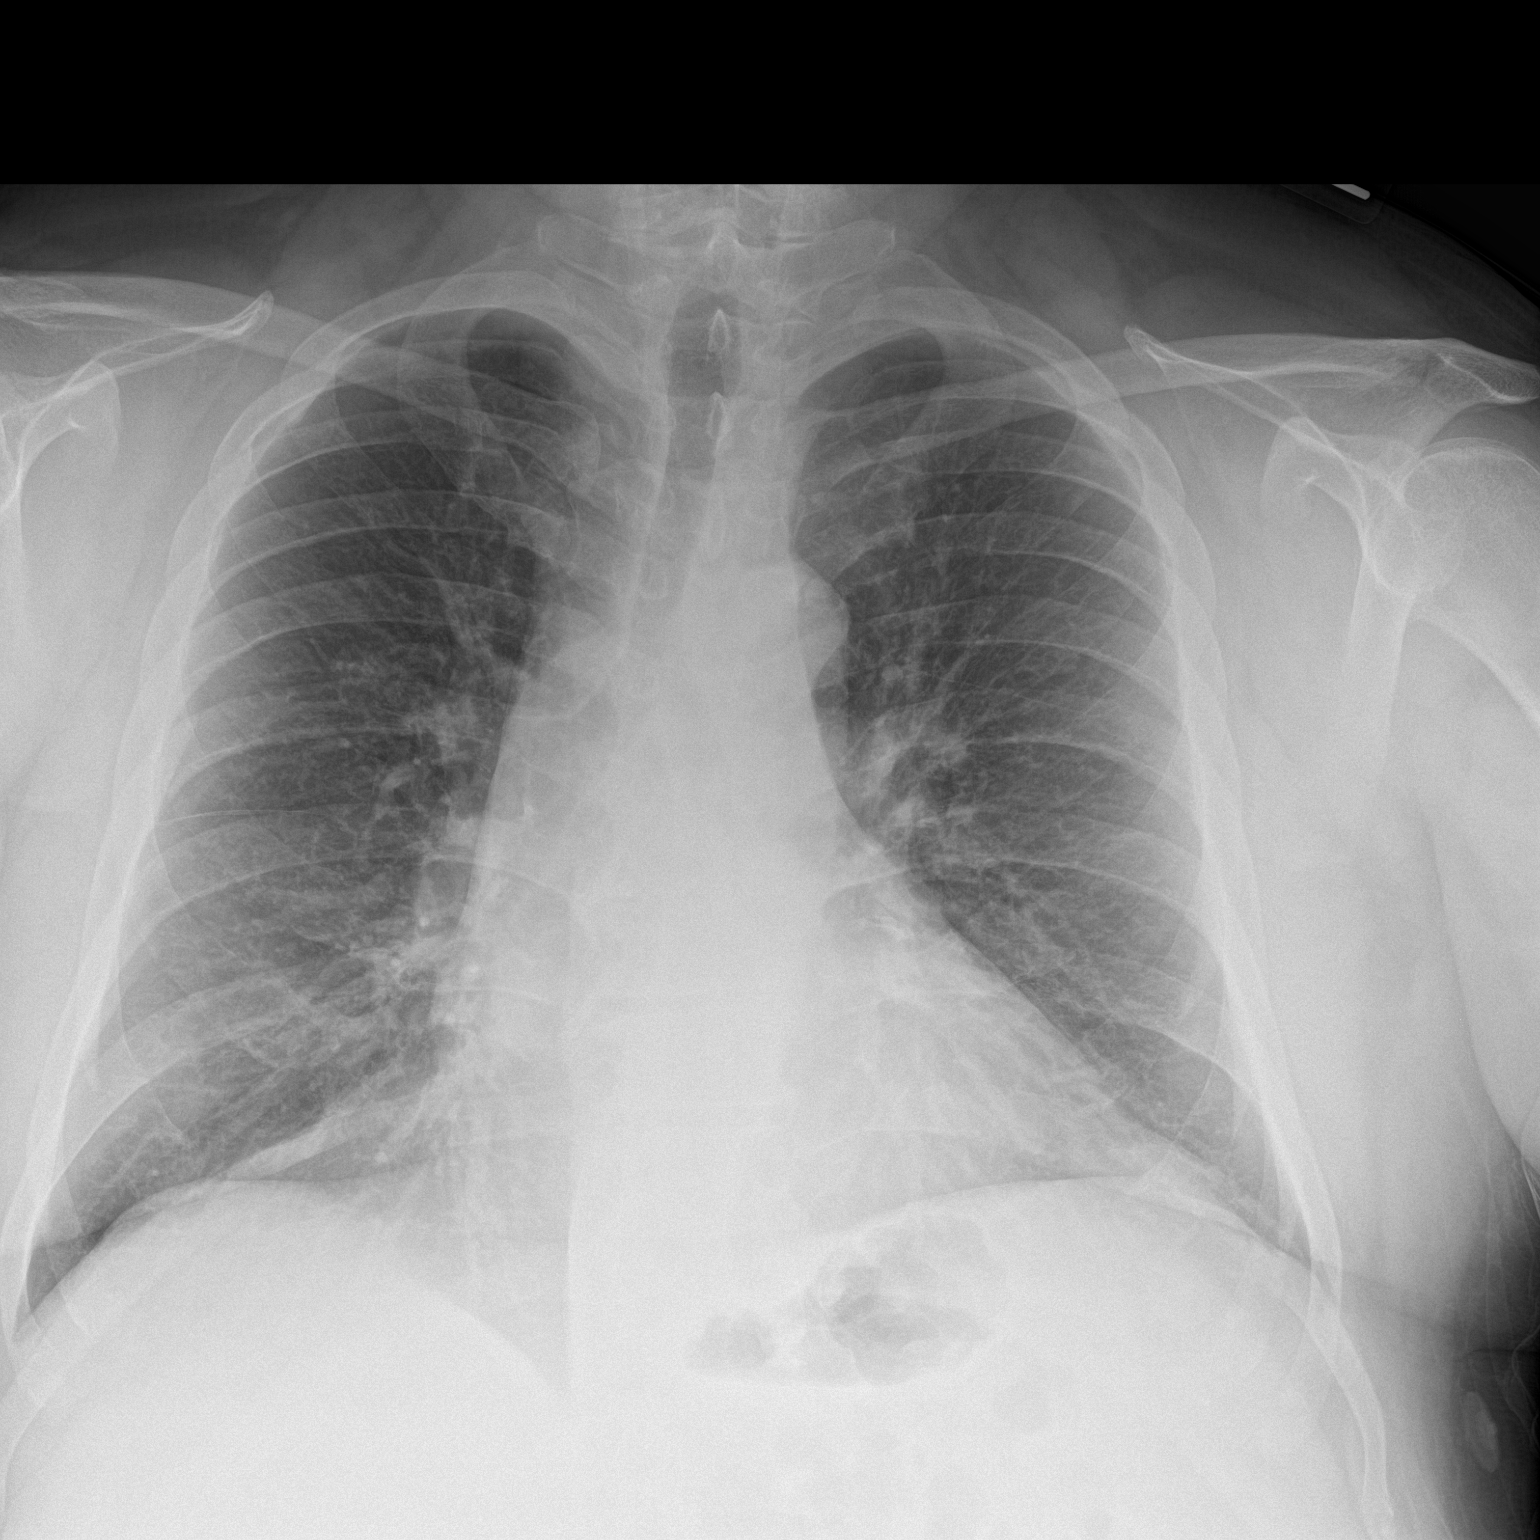

[chest lat]
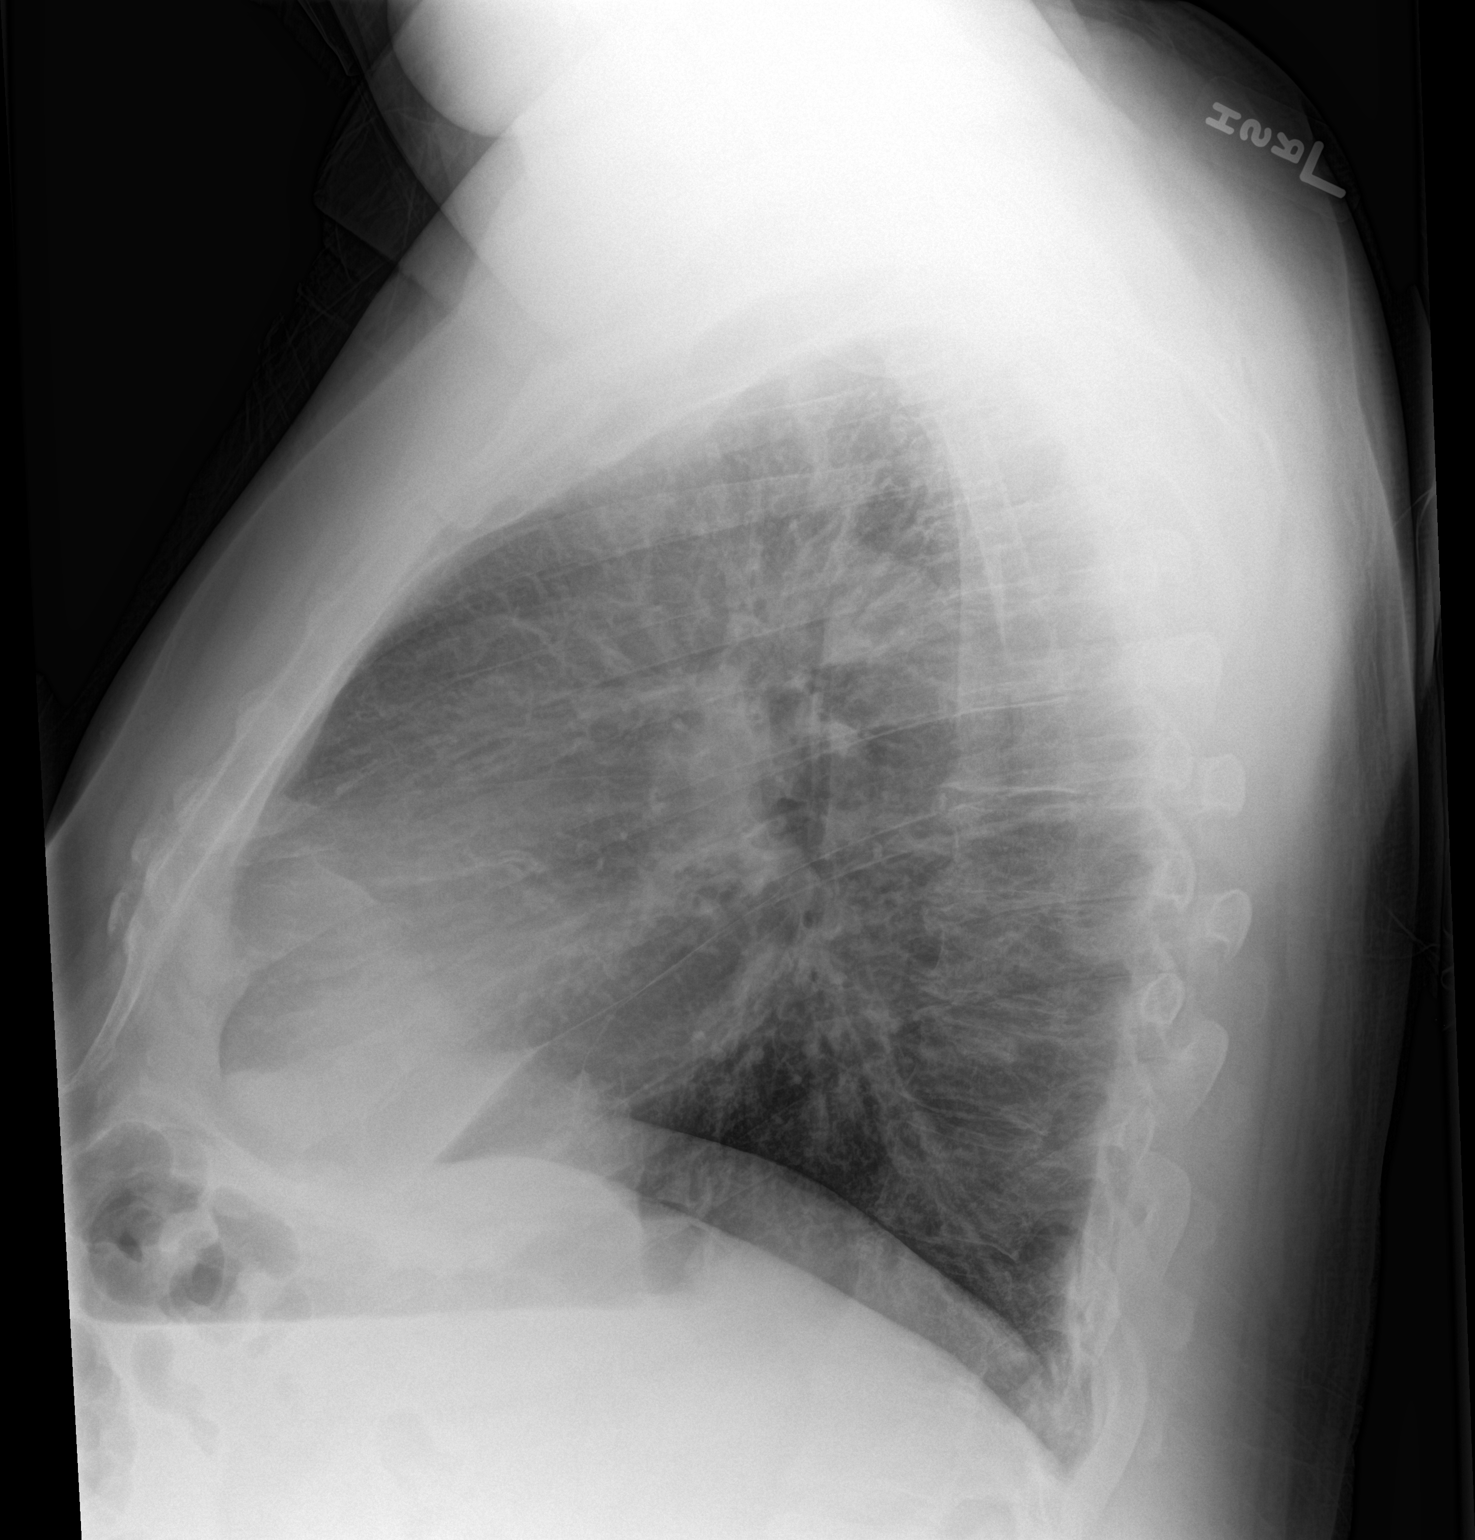

[2 of 2 positions shown; findings below may reference images not displayed]

FINDINGS: The heart is enlarged. There is no edema or effusion to suggest
failure. Mild bibasilar airspace disease likely reflects
atelectasis. No focal airspace disease is evident. Mild degenerative
changes are noted in the thoracic spine.
IMPRESSION: 1. Mild cardiomegaly without failure.
2. Mild bibasilar airspace disease likely reflects atelectasis.

## 2016-02-19 ENCOUNTER — Other Ambulatory Visit: Payer: Self-pay | Admitting: Cardiology

## 2016-03-24 ENCOUNTER — Telehealth: Payer: Self-pay | Admitting: Physician Assistant

## 2016-03-24 MED ORDER — VARENICLINE TARTRATE 0.5 MG X 11 & 1 MG X 42 PO MISC: ORAL | 5 refills | Status: DC

## 2016-03-24 NOTE — Telephone Encounter (Signed)
Aware.  Chantix scripts called to [facility].

## 2016-03-24 NOTE — Telephone Encounter (Signed)
Please advise if Chantix script will be done.

## 2016-04-04 ENCOUNTER — Encounter: Payer: Self-pay | Admitting: Physician Assistant

## 2016-04-04 ENCOUNTER — Ambulatory Visit (INDEPENDENT_AMBULATORY_CARE_PROVIDER_SITE_OTHER): Payer: BLUE CROSS/BLUE SHIELD | Admitting: Physician Assistant

## 2016-04-04 VITALS — BP 129/88 | HR 78 | Temp 98.3°F | Ht 72.0 in | Wt 281.0 lb

## 2016-04-04 DIAGNOSIS — G4733 Obstructive sleep apnea (adult) (pediatric): Secondary | ICD-10-CM

## 2016-04-04 DIAGNOSIS — I2510 Atherosclerotic heart disease of native coronary artery without angina pectoris: Secondary | ICD-10-CM | POA: Diagnosis not present

## 2016-04-04 DIAGNOSIS — I10 Essential (primary) hypertension: Secondary | ICD-10-CM

## 2016-04-04 DIAGNOSIS — Z720 Tobacco use: Secondary | ICD-10-CM | POA: Diagnosis not present

## 2016-04-04 DIAGNOSIS — Z716 Tobacco abuse counseling: Secondary | ICD-10-CM

## 2016-04-04 DIAGNOSIS — Z23 Encounter for immunization: Secondary | ICD-10-CM | POA: Diagnosis not present

## 2016-04-04 MED ORDER — AMLODIPINE BESYLATE 5 MG PO TABS: 5.0000 mg | ORAL_TABLET | Freq: Every day | ORAL | 3 refills | Status: DC

## 2016-04-04 MED ORDER — LISINOPRIL 40 MG PO TABS: 40.0000 mg | ORAL_TABLET | Freq: Every day | ORAL | 3 refills | Status: DC

## 2016-04-04 MED ORDER — ATORVASTATIN CALCIUM 40 MG PO TABS: ORAL_TABLET | ORAL | 3 refills | Status: DC

## 2016-04-04 MED ORDER — CLOPIDOGREL BISULFATE 75 MG PO TABS: ORAL_TABLET | ORAL | 3 refills | Status: DC

## 2016-04-04 MED ORDER — HYDROCHLOROTHIAZIDE 12.5 MG PO CAPS: ORAL_CAPSULE | ORAL | 3 refills | Status: DC

## 2016-04-04 MED ORDER — VARENICLINE TARTRATE 1 MG PO TABS: 1.0000 mg | ORAL_TABLET | Freq: Two times a day (BID) | ORAL | 6 refills | Status: DC

## 2016-04-04 NOTE — Patient Instructions (Signed)
Glucosamine chondroitin Steps to Quit Smoking Smoking tobacco can be bad for your health. It can also affect almost every organ in your body. Smoking puts you and people around you at risk for many serious long-lasting (chronic) diseases. Quitting smoking is hard, but it is one of the best things that you can do for your health. It is never too late to quit. What are the benefits of quitting smoking? When you quit smoking, you lower your risk for getting serious diseases and conditions. They can include:  Lung cancer or lung disease.  Heart disease.  Stroke.  Heart attack.  Not being able to have children (infertility).  Weak bones (osteoporosis) and broken bones (fractures). If you have coughing, wheezing, and shortness of breath, those symptoms may get better when you quit. You may also get sick less often. If you are pregnant, quitting smoking can help to lower your chances of having a baby of low birth weight. What can I do to help me quit smoking? Talk with your doctor about what can help you quit smoking. Some things you can do (strategies) include:  Quitting smoking totally, instead of slowly cutting back how much you smoke over a period of time.  Going to in-person counseling. You are more likely to quit if you go to many counseling sessions.  Using resources and support systems, such as:  Online chats with a Social worker.  Phone quitlines.  Printed Furniture conservator/restorer.  Support groups or group counseling.  Text messaging programs.  Mobile phone apps or applications.  Taking medicines. Some of these medicines may have nicotine in them. If you are pregnant or breastfeeding, do not take any medicines to quit smoking unless your doctor says it is okay. Talk with your doctor about counseling or other things that can help you. Talk with your doctor about using more than one strategy at the same time, such as taking medicines while you are also going to in-person counseling.  This can help make quitting easier. What things can I do to make it easier to quit? Quitting smoking might feel very hard at first, but there is a lot that you can do to make it easier. Take these steps:  Talk to your family and friends. Ask them to support and encourage you.  Call phone quitlines, reach out to support groups, or work with a Social worker.  Ask people who smoke to not smoke around you.  Avoid places that make you want (trigger) to smoke, such as:  Bars.  Parties.  Smoke-break areas at work.  Spend time with people who do not smoke.  Lower the stress in your life. Stress can make you want to smoke. Try these things to help your stress:  Getting regular exercise.  Deep-breathing exercises.  Yoga.  Meditating.  Doing a body scan. To do this, close your eyes, focus on one area of your body at a time from head to toe, and notice which parts of your body are tense. Try to relax the muscles in those areas.  Download or buy apps on your mobile phone or tablet that can help you stick to your quit plan. There are many free apps, such as QuitGuide from the [employer] Office manager for Disease Control and Prevention). You can find more support from smokefree.gov and other websites. This information is not intended to replace advice given to you by your health care provider. Make sure you discuss any questions you have with your health care provider. Document Released: 11/26/2008 Document Revised:  09/28/2015 Document Reviewed: 06/16/2014 Elsevier Interactive Patient Education  2017 [employer].

## 2016-04-04 NOTE — Progress Notes (Signed)
BP 129/88   Pulse 78   Temp 98.3 F (36.8 C) (Oral)   Ht 6' (1.829 m)   Wt 281 lb (127.5 kg)   BMI 38.11 kg/m    Subjective:    Patient ID: Scott Hatfield, male    DOB: [birth year], 56 y.o.   MRN: [id number]  HPI: Scott Hatfield is a 56 y.o. male presenting on 04/04/2016 for Follow-up (3 month )  This patient comes in for periodic recheck on medications and conditions including hypertension, CAD, s/p myocardial infarction, Smoking cessation, hyperlipidemia, plantar fasciitis.   All medications are reviewed today. There are no reports of any problems with the medications. All of the medical conditions are reviewed and updated.  Lab work is reviewed and will be ordered as medically necessary. There are no new problems reported with today's visit.   Relevant past medical, surgical, family and social history reviewed and updated as indicated. Allergies and medications reviewed and updated.  Past Medical History:  Diagnosis Date  . CAD in native artery    a. 2008 - Single-vessel CAD with stent placement to the circumflex  b. 2012 - DES to Diag.  c. 06/2014 - RCA 25% stenosis, LAD 30% stenosis, ramus intermediate 45% stenosis, D1 95% stenosis treated with DES  d.01/2015 - nonobstructive CAD  . GERD (gastroesophageal reflux disease)   . Hyperlipidemia, mixed   . Hypertension   . Overweight(278.02)   . Sleep apnea    CPAP    Past Surgical History:  Procedure Laterality Date  . CARDIAC CATHETERIZATION N/A 07/03/2014   Procedure: Left Heart Cath and Coronary Angiography;  Surgeon: Peter M Martinique, MD;  Location: [facility];  Service: Cardiovascular;  Laterality: N/A;  . CARDIAC CATHETERIZATION N/A 07/03/2014   Procedure: Coronary Stent Intervention;  Surgeon: Peter M Martinique, MD;  Location: [facility];  Service: Cardiovascular;  Laterality: N/A;  . CARDIAC CATHETERIZATION N/A 01/26/2015   Procedure: Left Heart Cath and Coronary Angiography;  Surgeon: Peter M Martinique, MD;   Location: [facility];  Service: Cardiovascular;  Laterality: N/A;  . CORONARY STENT PLACEMENT  2008, 2012    Review of Systems  Constitutional: Negative.  Negative for appetite change and fatigue.  HENT: Negative.   Eyes: Negative.  Negative for pain and visual disturbance.  Respiratory: Negative.  Negative for cough, chest tightness, shortness of breath and wheezing.   Cardiovascular: Negative.  Negative for chest pain, palpitations and leg swelling.  Gastrointestinal: Negative.  Negative for abdominal pain, diarrhea, nausea and vomiting.  Endocrine: Negative.   Genitourinary: Negative.   Musculoskeletal: Negative.   Skin: Negative.  Negative for color change and rash.  Neurological: Negative.  Negative for weakness, numbness and headaches.  Psychiatric/Behavioral: Negative.     Allergies as of 04/04/2016   No Known Allergies     Medication List       Accurate as of 04/04/16  5:20 PM. Always use your most recent med list.          amLODipine 5 MG tablet Commonly known as:  NORVASC Take 1 tablet (5 mg total) by mouth daily.   aspirin 81 MG tablet Take 81 mg by mouth daily.   atorvastatin 40 MG tablet Commonly known as:  LIPITOR TAKE ONE TABLET BY MOUTH ONCE DAILY AT 6PM   clopidogrel 75 MG tablet Commonly known as:  PLAVIX TAKE ONE TABLET BY MOUTH ONCE DAILY MUST  KEEP  OFFICE  VISIT   hydrochlorothiazide 12.5 MG  capsule Commonly known as:  MICROZIDE TAKE TWO CAPSULES BY MOUTH ONCE DAILY   lisinopril 40 MG tablet Commonly known as:  PRINIVIL,ZESTRIL Take 1 tablet (40 mg total) by mouth daily.   metoprolol 50 MG tablet Commonly known as:  LOPRESSOR Take 1 tablet (50 mg total) by mouth 2 (two) times daily.   nitroGLYCERIN 0.4 MG SL tablet Commonly known as:  NITROSTAT Place 1 tablet (0.4 mg total) under the tongue every 5 (five) minutes as needed. May repeat for up to 3 doses.   varenicline 1 MG tablet Commonly known as:  CHANTIX CONTINUING MONTH  PAK Take 1 tablet (1 mg total) by mouth 2 (two) times daily.          Objective:    BP 129/88   Pulse 78   Temp 98.3 F (36.8 C) (Oral)   Ht 6' (1.829 m)   Wt 281 lb (127.5 kg)   BMI 38.11 kg/m   No Known Allergies  Physical Exam  Constitutional: He appears well-developed and well-nourished.  HENT:  Head: Normocephalic and atraumatic.  Eyes: Conjunctivae and EOM are normal. Pupils are equal, round, and reactive to light.  Neck: Normal range of motion. Neck supple.  Cardiovascular: Normal rate, regular rhythm and normal heart sounds.   Pulmonary/Chest: Effort normal and breath sounds normal.  Abdominal: Soft. Bowel sounds are normal.  Musculoskeletal: Normal range of motion.  Skin: Skin is warm and dry.  Nursing note and vitals reviewed.       Assessment & Plan:   1. Essential hypertension - lisinopril (PRINIVIL,ZESTRIL) 40 MG tablet; Take 1 tablet (40 mg total) by mouth daily.  Dispense: 90 tablet; Refill: 3 - hydrochlorothiazide (MICROZIDE) 12.5 MG capsule; TAKE TWO CAPSULES BY MOUTH ONCE DAILY  Dispense: 180 capsule; Refill: 3 - amLODipine (NORVASC) 5 MG tablet; Take 1 tablet (5 mg total) by mouth daily.  Dispense: 90 tablet; Refill: 3 - CBC with Differential/Platelet - CMP14+EGFR - Lipid panel  2. Obstructive sleep apnea syndrome  3. Coronary artery disease involving native coronary artery of native heart without angina pectoris - clopidogrel (PLAVIX) 75 MG tablet; TAKE ONE TABLET BY MOUTH ONCE DAILY MUST  KEEP  OFFICE  VISIT  Dispense: 90 tablet; Refill: 3 - atorvastatin (LIPITOR) 40 MG tablet; TAKE ONE TABLET BY MOUTH ONCE DAILY AT 6PM  Dispense: 90 tablet; Refill: 3 - CBC with Differential/Platelet - CMP14+EGFR - Lipid panel  4. Encounter for immunization - varenicline (CHANTIX CONTINUING MONTH PAK) 1 MG tablet; Take 1 tablet (1 mg total) by mouth 2 (two) times daily.  Dispense: 60 tablet; Refill: 6 - lisinopril (PRINIVIL,ZESTRIL) 40 MG tablet; Take 1  tablet (40 mg total) by mouth daily.  Dispense: 90 tablet; Refill: 3 - clopidogrel (PLAVIX) 75 MG tablet; TAKE ONE TABLET BY MOUTH ONCE DAILY MUST  KEEP  OFFICE  VISIT  Dispense: 90 tablet; Refill: 3 - hydrochlorothiazide (MICROZIDE) 12.5 MG capsule; TAKE TWO CAPSULES BY MOUTH ONCE DAILY  Dispense: 180 capsule; Refill: 3 - atorvastatin (LIPITOR) 40 MG tablet; TAKE ONE TABLET BY MOUTH ONCE DAILY AT 6PM  Dispense: 90 tablet; Refill: 3 - amLODipine (NORVASC) 5 MG tablet; Take 1 tablet (5 mg total) by mouth daily.  Dispense: 90 tablet; Refill: 3 - CBC with Differential/Platelet - CMP14+EGFR - Lipid panel - Flu Vaccine QUAD 36+ mos IM  5. Encounter for smoking cessation counseling - varenicline (CHANTIX CONTINUING MONTH PAK) 1 MG tablet; Take 1 tablet (1 mg total) by mouth 2 (two) times daily.  Dispense:  60 tablet; Refill: 6    Continue all other maintenance medications as listed above.  Follow up plan: Return in about 3 months (around 07/02/2016) for recheck meds.  Educational handout given for smoking cessation  Terald Sleeper PA-C [facility] [address]  [place], [place] [address] [phone]   04/04/2016, 5:20 PM

## 2016-04-05 LAB — CBC WITH DIFFERENTIAL/PLATELET
Basophils Absolute: 0.1 10*3/uL (ref 0.0–0.2)
Basos: 1 %
EOS (ABSOLUTE): 0.6 10*3/uL — ABNORMAL HIGH (ref 0.0–0.4)
EOS: 6 %
HEMATOCRIT: 49.2 % (ref 37.5–51.0)
Hemoglobin: 16.9 g/dL (ref 13.0–17.7)
IMMATURE GRANULOCYTES: 1 %
Immature Grans (Abs): 0.1 10*3/uL (ref 0.0–0.1)
LYMPHS: 32 %
Lymphocytes Absolute: 3.1 10*3/uL (ref 0.7–3.1)
MCH: 32.2 pg (ref 26.6–33.0)
MCHC: 34.3 g/dL (ref 31.5–35.7)
MCV: 94 fL (ref 79–97)
MONOCYTES: 8 %
MONOS ABS: 0.7 10*3/uL (ref 0.1–0.9)
NEUTROS PCT: 52 %
Neutrophils Absolute: 5.1 10*3/uL (ref 1.4–7.0)
Platelets: 254 10*3/uL (ref 150–379)
RBC: 5.25 x10E6/uL (ref 4.14–5.80)
RDW: 13.2 % (ref 12.3–15.4)
WBC: 9.6 10*3/uL (ref 3.4–10.8)

## 2016-04-05 LAB — CMP14+EGFR
ALT: 29 IU/L (ref 0–44)
AST: 16 IU/L (ref 0–40)
Albumin/Globulin Ratio: 1.9 (ref 1.2–2.2)
Albumin: 4.4 g/dL (ref 3.5–5.5)
Alkaline Phosphatase: 68 IU/L (ref 39–117)
BUN/Creatinine Ratio: 13 (ref 9–20)
BUN: 12 mg/dL (ref 6–24)
Bilirubin Total: 0.5 mg/dL (ref 0.0–1.2)
CALCIUM: 9.1 mg/dL (ref 8.7–10.2)
CO2: 21 mmol/L (ref 18–29)
CREATININE: 0.93 mg/dL (ref 0.76–1.27)
Chloride: 100 mmol/L (ref 96–106)
GFR calc Af Amer: 106 (ref >59)
GFR, EST NON AFRICAN AMERICAN: 92 (ref >59)
GLOBULIN, TOTAL: 2.3 (ref 1.5–4.5)
Glucose: 87 mg/dL (ref 65–99)
Potassium: 4 mmol/L (ref 3.5–5.2)
Sodium: 140 mmol/L (ref 134–144)
Total Protein: 6.7 g/dL (ref 6.0–8.5)

## 2016-04-05 LAB — LIPID PANEL
CHOL/HDL RATIO: 3.4 (ref 0.0–5.0)
Cholesterol, Total: 147 mg/dL (ref 100–199)
HDL: 43 mg/dL (ref >39)
LDL Calculated: 84 (ref 0–99)
TRIGLYCERIDES: 102 mg/dL (ref 0–149)
VLDL CHOLESTEROL CAL: 20 (ref 5–40)

## 2016-09-21 ENCOUNTER — Encounter: Payer: Self-pay | Admitting: *Deleted

## 2016-09-21 DIAGNOSIS — Z9861 Coronary angioplasty status: Secondary | ICD-10-CM

## 2016-09-21 NOTE — Progress Notes (Signed)
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  He has a history of PTCA with stents and has not had a stress test with in the past 2 years.  DOT guidelines say patient needs stress test every other year.  Per Chevis Pretty, FNP stress test ordered.  Patient states he does not know if he will be able to walk on the treadmill because he has plantar fasciitis.  Advised he can discuss with cardiologist to decide.

## 2016-09-27 ENCOUNTER — Telehealth: Payer: Self-pay | Admitting: *Deleted

## 2016-09-27 NOTE — Telephone Encounter (Signed)
Please review for treadmill  

## 2016-09-28 NOTE — Telephone Encounter (Signed)
It looks like this patient has had CAD with a stent to the past, he would be a better candidate to go back to cardiology for this

## 2016-09-28 NOTE — Telephone Encounter (Signed)
We will not be able to do his stress test here

## 2016-10-03 ENCOUNTER — Telehealth: Payer: Self-pay | Admitting: *Deleted

## 2016-10-03 NOTE — Telephone Encounter (Signed)
As patient is unsure if he can walk on a treadmill, and it has been over 1 year since he followed up with Dr. Percival Spanish- scheduled him for a follow appointment with Dr. Percival Spanish for 10/06/16 to discuss and schedule appropriate stress test.  Patient notified of appointment.

## 2016-10-05 ENCOUNTER — Ambulatory Visit (INDEPENDENT_AMBULATORY_CARE_PROVIDER_SITE_OTHER): Payer: BLUE CROSS/BLUE SHIELD

## 2016-10-05 DIAGNOSIS — Z9861 Coronary angioplasty status: Secondary | ICD-10-CM | POA: Diagnosis not present

## 2016-10-05 LAB — EXERCISE TOLERANCE TEST
CHL CUP MPHR: 164 {beats}/min
CHL CUP STRESS STAGE 1 GRADE: 0 %
CHL CUP STRESS STAGE 1 SBP: 144 mmHg
CHL CUP STRESS STAGE 1 SPEED: 0 mph
CHL CUP STRESS STAGE 2 GRADE: 0 %
CHL CUP STRESS STAGE 2 HR: 93 {beats}/min
CHL CUP STRESS STAGE 3 GRADE: 0 %
CHL CUP STRESS STAGE 3 HR: 93 {beats}/min
CHL CUP STRESS STAGE 4 HR: 118 {beats}/min
CHL CUP STRESS STAGE 5 GRADE: 12 %
CHL CUP STRESS STAGE 5 SPEED: 2.5 mph
CHL CUP STRESS STAGE 6 GRADE: 14 %
CHL CUP STRESS STAGE 6 HR: 148 {beats}/min
CHL CUP STRESS STAGE 6 SPEED: 3 mph
CHL CUP STRESS STAGE 7 HR: 126 {beats}/min
CHL CUP STRESS STAGE 7 SBP: 212 mmHg
CHL CUP STRESS STAGE 8 GRADE: 0 %
CHL CUP STRESS STAGE 8 HR: 98 {beats}/min
CHL CUP STRESS STAGE 8 SBP: 156 mmHg
CSEPEW: 8.1 METS
Exercise duration (min): 7 min
Exercise duration (sec): 10 s
Peak HR: 148 {beats}/min
Percent HR: 91 %
Percent of predicted max HR: 90 %
RPE: 16
Rest HR: 83 {beats}/min
Stage 1 DBP: 79 mmHg
Stage 1 HR: 91 {beats}/min
Stage 2 Speed: 1 mph
Stage 3 Speed: 1 mph
Stage 4 DBP: 74 mmHg
Stage 4 Grade: 10 %
Stage 4 SBP: 183 mmHg
Stage 4 Speed: 1.7 mph
Stage 5 DBP: 110 mmHg
Stage 5 HR: 137 {beats}/min
Stage 5 SBP: 219 mmHg
Stage 7 DBP: 98 mmHg
Stage 7 Grade: 0 %
Stage 7 Speed: 0 mph
Stage 8 DBP: 68 mmHg
Stage 8 Speed: 0 mph

## 2016-10-06 ENCOUNTER — Other Ambulatory Visit: Payer: Self-pay | Admitting: Cardiology

## 2016-10-06 ENCOUNTER — Ambulatory Visit: Payer: BLUE CROSS/BLUE SHIELD | Admitting: Cardiology

## 2016-12-05 ENCOUNTER — Encounter: Payer: Self-pay | Admitting: Cardiology

## 2016-12-18 NOTE — Progress Notes (Signed)
HPI Patient presents for evaluation of coronary artery disease. He had a history of circumflex stenting in another town in 2008. He presented urgently in 10/12 with unstable angina and ST segment elevation and was found to have an occluded diagonal. He had a drug-eluting stent placed.  He was in the hospital in May of 2016 with unstable angina and was found to have 95% stenosis in the diagonal again treated with a drug-eluting stent.  He was again in the hospital in Dec of 2016.   He again had chest pain but cath at that time demonstrated non obstructive disease with patent stents.  He was managed medically.   Since I last saw him he has done well.  The patient denies any new symptoms such as chest discomfort, neck or arm discomfort. There has been no new shortness of breath, PND or orthopnea. There have been no reported palpitations, presyncope or syncope.  He is limited by plantar fasciitis.  He is gained about 13 pounds.  He has had some decreased exercise tolerance with increasing fatigue but he says his sleep mask did help this in the past.  He did have to have a DOT POET (Plain Old Exercise Treadmill) earlier this year and it was negative for any evidence of ischemia.  No Known Allergies  Current Outpatient Medications  Medication Sig Dispense Refill  . amLODipine (NORVASC) 5 MG tablet Take 1 tablet (5 mg total) by mouth daily. 90 tablet 3  . aspirin 81 MG tablet Take 81 mg by mouth daily.      Marland Kitchen atorvastatin (LIPITOR) 40 MG tablet TAKE ONE TABLET BY MOUTH ONCE DAILY AT 6PM 90 tablet 3  . hydrochlorothiazide (MICROZIDE) 12.5 MG capsule Take 12.5 mg 2 (two) times daily by mouth.    Marland Kitchen lisinopril (PRINIVIL,ZESTRIL) 40 MG tablet Take 1 tablet (40 mg total) by mouth daily. 90 tablet 3  . metoprolol tartrate (LOPRESSOR) 50 MG tablet TAKE ONE TABLET BY MOUTH TWICE DAILY 60 tablet 11  . nitroGLYCERIN (NITROSTAT) 0.4 MG SL tablet Place 1 tablet (0.4 mg total) under the tongue every 5 (five) minutes  as needed. May repeat for up to 3 doses. 25 tablet prn  . varenicline (CHANTIX CONTINUING MONTH PAK) 1 MG tablet Take 1 tablet (1 mg total) by mouth 2 (two) times daily. 60 tablet 6   No current facility-administered medications for this visit.     Past Medical History:  Diagnosis Date  . CAD in native artery    a. 2008 - Single-vessel CAD with stent placement to the circumflex  b. 2012 - DES to Diag.  c. 06/2014 - RCA 25% stenosis, LAD 30% stenosis, ramus intermediate 45% stenosis, D1 95% stenosis treated with DES  d.01/2015 - nonobstructive CAD  . GERD (gastroesophageal reflux disease)   . Hyperlipidemia, mixed   . Hypertension   . Overweight(278.02)   . Sleep apnea    CPAP    Past Surgical History:  Procedure Laterality Date  . CORONARY STENT PLACEMENT  2008, 2012   ROS:  As stated in the HPI and negative for all other systems.  PHYSICAL EXAM BP 132/82   Pulse 82   Ht 6' (1.829 m)   Wt 294 lb (133.4 kg)   BMI 39.87 kg/m   GENERAL:  Well appearing NECK:  No jugular venous distention, waveform within normal limits, carotid upstroke brisk and symmetric, no bruits, no thyromegaly LUNGS:  Clear to auscultation bilaterally CHEST:  Unremarkable HEART:  PMI not displaced or  sustained,S1 and S2 within normal limits, no S3, no S4, no clicks, no rubs, no murmurs ABD:  Flat, positive bowel sounds normal in frequency in pitch, no bruits, no rebound, no guarding, no midline pulsatile mass, no hepatomegaly, no splenomegaly EXT:  2 plus pulses throughout, no edema, no cyanosis no clubbing   EKG:  Sinus rhythm, rate 82, axis within normal limits, intervals within normal limits, no acute ST-T wave changes.  12/20/2016  Lab Results  Component Value Date   CHOL 147 04/04/2016   TRIG 102 04/04/2016   HDL 43 04/04/2016   LDLCALC 84 04/04/2016    ASSESSMENT AND PLAN   CAD, NATIVE VESSEL -  The patient has no new sypmtoms.  No further cardiovascular testing is indicated.  We will  continue with aggressive risk reduction.  He can stop the Plavix.   He had a negative POET (Plain Old Exercise Treadmill) earlier this year.   HYPERTENSION, UNSPECIFIED -  His blood pressure is OK.  No change in therapy.   HYPERLIPIDEMIA-MIXED -  Lipids are as above. He will continue meds as listed.  He will continue on the meds as listed.  He should have this repeated early next year and I will defer to Terald Sleeper, PA-C   TOBACCO ABUSE -  He started smoking again and is back on Chantix.    OVERWEIGHT/OBESITY -  This is difficult as he cannot exercise.  He understands the need for calorie control and diet choices.  We have talked about this at length in the past.   SLEEP APNEA -  He does well with CPAP.  FATIGUE - I do not see an ischemic etiology to this.  This got better with CPAP and then a little worse as he was able to do less physical activity and gained weight.  I think this is probably weight and deconditioning.

## 2016-12-20 ENCOUNTER — Ambulatory Visit: Payer: BLUE CROSS/BLUE SHIELD | Admitting: Cardiology

## 2016-12-20 ENCOUNTER — Encounter: Payer: Self-pay | Admitting: Cardiology

## 2016-12-20 VITALS — BP 132/82 | HR 82 | Ht 72.0 in | Wt 294.0 lb

## 2016-12-20 DIAGNOSIS — I2510 Atherosclerotic heart disease of native coronary artery without angina pectoris: Secondary | ICD-10-CM

## 2016-12-20 DIAGNOSIS — I10 Essential (primary) hypertension: Secondary | ICD-10-CM | POA: Diagnosis not present

## 2016-12-20 DIAGNOSIS — R5383 Other fatigue: Secondary | ICD-10-CM | POA: Insufficient documentation

## 2016-12-20 DIAGNOSIS — Z720 Tobacco use: Secondary | ICD-10-CM

## 2016-12-20 DIAGNOSIS — E785 Hyperlipidemia, unspecified: Secondary | ICD-10-CM | POA: Diagnosis not present

## 2016-12-20 DIAGNOSIS — E669 Obesity, unspecified: Secondary | ICD-10-CM | POA: Diagnosis not present

## 2016-12-20 NOTE — Patient Instructions (Signed)
Medication Instructions:  You may stop your Plavix. Continue all other medications as listed.  Follow-Up: Follow up in 1 year with Dr. Percival Spanish in [place].  You will receive a letter in the mail 2 months before you are due.  Please call us when you receive this letter to schedule your follow up appointment.  If you need a refill on your cardiac medications before your next appointment, please call your pharmacy.  Thank you for choosing [facility]!!

## 2016-12-25 ENCOUNTER — Ambulatory Visit (INDEPENDENT_AMBULATORY_CARE_PROVIDER_SITE_OTHER): Payer: BLUE CROSS/BLUE SHIELD | Admitting: *Deleted

## 2016-12-25 DIAGNOSIS — Z23 Encounter for immunization: Secondary | ICD-10-CM

## 2017-03-31 ENCOUNTER — Other Ambulatory Visit: Payer: Self-pay | Admitting: Physician Assistant

## 2017-04-02 ENCOUNTER — Other Ambulatory Visit: Payer: Self-pay

## 2017-04-02 DIAGNOSIS — Z716 Tobacco abuse counseling: Secondary | ICD-10-CM

## 2017-04-02 DIAGNOSIS — Z720 Tobacco use: Secondary | ICD-10-CM

## 2017-04-03 ENCOUNTER — Other Ambulatory Visit: Payer: Self-pay | Admitting: Physician Assistant

## 2017-04-03 DIAGNOSIS — Z716 Tobacco abuse counseling: Secondary | ICD-10-CM

## 2017-04-03 MED ORDER — VARENICLINE TARTRATE 1 MG PO TABS: 1.0000 mg | ORAL_TABLET | Freq: Two times a day (BID) | ORAL | 6 refills | Status: DC

## 2017-04-03 NOTE — Telephone Encounter (Signed)
Medication sent.

## 2017-04-03 NOTE — Telephone Encounter (Signed)
Patient aware.

## 2017-04-08 ENCOUNTER — Other Ambulatory Visit: Payer: Self-pay | Admitting: Physician Assistant

## 2017-04-08 DIAGNOSIS — I2510 Atherosclerotic heart disease of native coronary artery without angina pectoris: Secondary | ICD-10-CM

## 2017-04-08 DIAGNOSIS — I10 Essential (primary) hypertension: Secondary | ICD-10-CM

## 2017-04-10 ENCOUNTER — Encounter: Payer: Self-pay | Admitting: Physician Assistant

## 2017-04-10 ENCOUNTER — Ambulatory Visit: Payer: BLUE CROSS/BLUE SHIELD | Admitting: Physician Assistant

## 2017-04-10 VITALS — BP 126/77 | HR 82 | Temp 98.0°F | Ht 72.0 in | Wt 294.4 lb

## 2017-04-10 DIAGNOSIS — I10 Essential (primary) hypertension: Secondary | ICD-10-CM | POA: Diagnosis not present

## 2017-04-10 DIAGNOSIS — I2510 Atherosclerotic heart disease of native coronary artery without angina pectoris: Secondary | ICD-10-CM | POA: Diagnosis not present

## 2017-04-10 DIAGNOSIS — G629 Polyneuropathy, unspecified: Secondary | ICD-10-CM

## 2017-04-10 DIAGNOSIS — Z0000 Encounter for general adult medical examination without abnormal findings: Secondary | ICD-10-CM

## 2017-04-10 MED ORDER — LISINOPRIL 40 MG PO TABS: 40.0000 mg | ORAL_TABLET | Freq: Every day | ORAL | 3 refills | Status: DC

## 2017-04-10 MED ORDER — GABAPENTIN 100 MG PO CAPS: 100.0000 mg | ORAL_CAPSULE | Freq: Every day | ORAL | 5 refills | Status: DC

## 2017-04-10 MED ORDER — ATORVASTATIN CALCIUM 40 MG PO TABS: ORAL_TABLET | ORAL | 3 refills | Status: DC

## 2017-04-10 MED ORDER — NITROGLYCERIN 0.4 MG SL SUBL: 0.4000 mg | SUBLINGUAL_TABLET | SUBLINGUAL | 99 refills | Status: DC | PRN

## 2017-04-10 NOTE — Patient Instructions (Signed)
In a few days you may receive a survey in the mail or online from [employer] regarding your visit with us today. Please take a moment to fill this out. Your feedback is very important to our whole office. It can help us better understand your needs as well as improve your experience and satisfaction. Thank you for taking your time to complete it. We care about you.  Jaideep Pollack, PA-C  

## 2017-04-11 DIAGNOSIS — G629 Polyneuropathy, unspecified: Secondary | ICD-10-CM | POA: Insufficient documentation

## 2017-04-11 LAB — CMP14+EGFR
A/G RATIO: 1.8 (ref 1.2–2.2)
ALT: 29 IU/L (ref 0–44)
AST: 15 IU/L (ref 0–40)
Albumin: 4.5 g/dL (ref 3.5–5.5)
Alkaline Phosphatase: 78 IU/L (ref 39–117)
BILIRUBIN TOTAL: 0.3 mg/dL (ref 0.0–1.2)
BUN / CREAT RATIO: 12 (ref 9–20)
BUN: 11 mg/dL (ref 6–24)
CHLORIDE: 102 mmol/L (ref 96–106)
CO2: 22 mmol/L (ref 20–29)
Calcium: 9.1 mg/dL (ref 8.7–10.2)
Creatinine, Ser: 0.92 mg/dL (ref 0.76–1.27)
GFR, EST AFRICAN AMERICAN: 107 mL/min/{1.73_m2} (ref >59)
GFR, EST NON AFRICAN AMERICAN: 93 mL/min/{1.73_m2} (ref >59)
GLOBULIN, TOTAL: 2.5 g/dL (ref 1.5–4.5)
Glucose: 86 mg/dL (ref 65–99)
Potassium: 4.3 mmol/L (ref 3.5–5.2)
SODIUM: 139 mmol/L (ref 134–144)
TOTAL PROTEIN: 7 g/dL (ref 6.0–8.5)

## 2017-04-11 LAB — CBC WITH DIFFERENTIAL/PLATELET
BASOS: 1 %
Basophils Absolute: 0.1 10*3/uL (ref 0.0–0.2)
EOS (ABSOLUTE): 0.7 10*3/uL — AB (ref 0.0–0.4)
Eos: 7 %
HEMATOCRIT: 49.4 % (ref 37.5–51.0)
Hemoglobin: 16.9 g/dL (ref 13.0–17.7)
IMMATURE GRANS (ABS): 0.1 10*3/uL (ref 0.0–0.1)
Immature Granulocytes: 1 %
LYMPHS: 33 %
Lymphocytes Absolute: 2.9 10*3/uL (ref 0.7–3.1)
MCH: 31.7 pg (ref 26.6–33.0)
MCHC: 34.2 g/dL (ref 31.5–35.7)
MCV: 93 fL (ref 79–97)
Monocytes Absolute: 0.9 10*3/uL (ref 0.1–0.9)
Monocytes: 10 %
Neutrophils Absolute: 4.3 10*3/uL (ref 1.4–7.0)
Neutrophils: 48 %
PLATELETS: 270 10*3/uL (ref 150–379)
RBC: 5.33 x10E6/uL (ref 4.14–5.80)
RDW: 12.9 % (ref 12.3–15.4)
WBC: 8.8 10*3/uL (ref 3.4–10.8)

## 2017-04-11 LAB — LIPID PANEL
CHOL/HDL RATIO: 3.9 ratio (ref 0.0–5.0)
Cholesterol, Total: 151 mg/dL (ref 100–199)
HDL: 39 mg/dL — ABNORMAL LOW (ref >39)
LDL Calculated: 85 mg/dL (ref 0–99)
Triglycerides: 136 mg/dL (ref 0–149)
VLDL Cholesterol Cal: 27 mg/dL (ref 5–40)

## 2017-04-11 LAB — PSA: PROSTATE SPECIFIC AG, SERUM: 0.6 ng/mL (ref 0.0–4.0)

## 2017-04-11 NOTE — Progress Notes (Signed)
BP 126/77   Pulse 82   Temp 98 F (36.7 C) (Oral)   Ht 6' (1.829 m)   Wt 294 lb 6.4 oz (133.5 kg)   BMI 39.93 kg/m    Subjective:    Patient ID: Scott Hatfield, male    DOB: 05-24-1960, 57 y.o.   MRN: [id number]  HPI: Scott Hatfield is a 57 y.o. male presenting on 04/10/2017 for Follow-up (1 year )  This patient comes in for annual well physical examination. All medications are reviewed today. There are no reports of any problems with the medications. All of the medical conditions are reviewed and updated.  Lab work is reviewed and will be ordered as medically necessary. There are no new problems reported with today's visit.  Patient reports doing well overall.  Sleep apnea is doing very well  Past Medical History:  Diagnosis Date  . CAD in native artery    a. 2008 - Single-vessel CAD with stent placement to the circumflex  b. 2012 - DES to Diag.  c. 06/2014 - RCA 25% stenosis, LAD 30% stenosis, ramus intermediate 45% stenosis, D1 95% stenosis treated with DES  d.01/2015 - nonobstructive CAD  . GERD (gastroesophageal reflux disease)   . Hyperlipidemia, mixed   . Hypertension   . Overweight(278.02)   . Sleep apnea    CPAP   Relevant past medical, surgical, family and social history reviewed and updated as indicated. Interim medical history since our last visit reviewed. Allergies and medications reviewed and updated. DATA REVIEWED: CHART IN EPIC  Family History reviewed for pertinent findings.  Review of Systems  Constitutional: Negative.  Negative for appetite change and fatigue.  HENT: Negative.   Eyes: Negative.  Negative for pain and visual disturbance.  Respiratory: Negative.  Negative for cough, chest tightness, shortness of breath and wheezing.   Cardiovascular: Negative.  Negative for chest pain, palpitations and leg swelling.  Gastrointestinal: Negative.  Negative for abdominal pain, diarrhea, nausea and vomiting.  Endocrine: Negative.   Genitourinary:  Negative.   Musculoskeletal: Negative.   Skin: Negative.  Negative for color change and rash.  Neurological: Negative.  Negative for weakness, numbness and headaches.  Psychiatric/Behavioral: Negative.     Allergies as of 04/10/2017   No Known Allergies     Medication List        Accurate as of 04/10/17 11:59 PM. Always use your most recent med list.          amLODipine 5 MG tablet Commonly known as:  NORVASC Take 1 tablet (5 mg total) by mouth daily.   aspirin 81 MG tablet Take 81 mg by mouth daily.   atorvastatin 40 MG tablet Commonly known as:  LIPITOR TAKE 1 TABLET BY MOUTH ONCE DAILY AT  6  PM   gabapentin 100 MG capsule Commonly known as:  NEURONTIN Take 1-3 capsules (100-300 mg total) by mouth at bedtime.   hydrochlorothiazide 12.5 MG capsule Commonly known as:  MICROZIDE Take 12.5 mg 2 (two) times daily by mouth.   lisinopril 40 MG tablet Commonly known as:  PRINIVIL,ZESTRIL Take 1 tablet (40 mg total) by mouth daily.   metoprolol tartrate 50 MG tablet Commonly known as:  LOPRESSOR TAKE ONE TABLET BY MOUTH TWICE DAILY   nitroGLYCERIN 0.4 MG SL tablet Commonly known as:  NITROSTAT Place 1 tablet (0.4 mg total) under the tongue every 5 (five) minutes as needed. May repeat for up to 3 doses.   varenicline 1 MG tablet Commonly known as:  CHANTIX CONTINUING MONTH PAK Take 1 tablet (1 mg total) by mouth 2 (two) times daily.          Objective:    BP 126/77   Pulse 82   Temp 98 F (36.7 C) (Oral)   Ht 6' (1.829 m)   Wt 294 lb 6.4 oz (133.5 kg)   BMI 39.93 kg/m   No Known Allergies  Wt Readings from Last 3 Encounters:  04/10/17 294 lb 6.4 oz (133.5 kg)  12/20/16 294 lb (133.4 kg)  04/04/16 281 lb (127.5 kg)    Physical Exam  Constitutional: He appears well-developed and well-nourished.  HENT:  Head: Normocephalic and atraumatic.  Eyes: Conjunctivae and EOM are normal. Pupils are equal, round, and reactive to light.  Neck: Normal range of  motion. Neck supple.  Cardiovascular: Normal rate, regular rhythm and normal heart sounds.  Pulmonary/Chest: Effort normal and breath sounds normal.  Abdominal: Soft. Bowel sounds are normal.  Musculoskeletal: Normal range of motion.  Skin: Skin is warm and dry.  Nursing note and vitals reviewed.   Results for orders placed or performed in visit on 04/10/17  CBC with Differential/Platelet  Result Value Ref Range   WBC 8.8 3.4 - 10.8 x10E3/uL   RBC 5.33 4.14 - 5.80 x10E6/uL   Hemoglobin 16.9 13.0 - 17.7 g/dL   Hematocrit 49.4 37.5 - 51.0 %   MCV 93 79 - 97 fL   MCH 31.7 26.6 - 33.0 pg   MCHC 34.2 31.5 - 35.7 g/dL   RDW 12.9 12.3 - 15.4 %   Platelets 270 150 - 379 x10E3/uL   Neutrophils 48 Not Estab. %   Lymphs 33 Not Estab. %   Monocytes 10 Not Estab. %   Eos 7 Not Estab. %   Basos 1 Not Estab. %   Neutrophils Absolute 4.3 1.4 - 7.0 x10E3/uL   Lymphocytes Absolute 2.9 0.7 - 3.1 x10E3/uL   Monocytes Absolute 0.9 0.1 - 0.9 x10E3/uL   EOS (ABSOLUTE) 0.7 (H) 0.0 - 0.4 x10E3/uL   Basophils Absolute 0.1 0.0 - 0.2 x10E3/uL   Immature Granulocytes 1 Not Estab. %   Immature Grans (Abs) 0.1 0.0 - 0.1 x10E3/uL  CMP14+EGFR  Result Value Ref Range   Glucose 86 65 - 99 mg/dL   BUN 11 6 - 24 mg/dL   Creatinine, Ser 0.92 0.76 - 1.27 mg/dL   GFR calc non Af Amer 93 >59 mL/min/1.73   GFR calc Af Amer 107 >59 mL/min/1.73   BUN/Creatinine Ratio 12 9 - 20   Sodium 139 134 - 144 mmol/L   Potassium 4.3 3.5 - 5.2 mmol/L   Chloride 102 96 - 106 mmol/L   CO2 22 20 - 29 mmol/L   Calcium 9.1 8.7 - 10.2 mg/dL   Total Protein 7.0 6.0 - 8.5 g/dL   Albumin 4.5 3.5 - 5.5 g/dL   Globulin, Total 2.5 1.5 - 4.5 g/dL   Albumin/Globulin Ratio 1.8 1.2 - 2.2   Bilirubin Total 0.3 0.0 - 1.2 mg/dL   Alkaline Phosphatase 78 39 - 117 IU/L   AST 15 0 - 40 IU/L   ALT 29 0 - 44 IU/L  Lipid panel  Result Value Ref Range   Cholesterol, Total 151 100 - 199 mg/dL   Triglycerides 136 0 - 149 mg/dL   HDL 39 (L)  >39 mg/dL   VLDL Cholesterol Cal 27 5 - 40 mg/dL   LDL Calculated 85 0 - 99 mg/dL   Chol/HDL Ratio 3.9 0.0 -  5.0 ratio  PSA  Result Value Ref Range   Prostate Specific Ag, Serum 0.6 0.0 - 4.0 ng/mL      Assessment & Plan:   1. Essential hypertension - CBC with Differential/Platelet - CMP14+EGFR - Lipid panel - lisinopril (PRINIVIL,ZESTRIL) 40 MG tablet; Take 1 tablet (40 mg total) by mouth daily.  Dispense: 90 tablet; Refill: 3  2. Coronary artery disease involving native coronary artery of native heart without angina pectoris - CBC with Differential/Platelet - CMP14+EGFR - Lipid panel - PSA - atorvastatin (LIPITOR) 40 MG tablet; TAKE 1 TABLET BY MOUTH ONCE DAILY AT  6  PM  Dispense: 90 tablet; Refill: 3  3. Well adult exam - PSA  4. Neuropathy   Continue all other maintenance medications as listed above.  Follow up plan: Return in about 1 year (around 04/10/2018) for recheck.  Educational handout given for [place] PA-C [facility] [address]  [place], [place] [address] [phone]   04/11/2017, 4:57 PM

## 2017-05-13 ENCOUNTER — Other Ambulatory Visit: Payer: Self-pay | Admitting: Physician Assistant

## 2017-05-13 DIAGNOSIS — I10 Essential (primary) hypertension: Secondary | ICD-10-CM

## 2017-06-03 ENCOUNTER — Other Ambulatory Visit: Payer: Self-pay | Admitting: Physician Assistant

## 2017-06-03 DIAGNOSIS — I10 Essential (primary) hypertension: Secondary | ICD-10-CM

## 2017-09-15 ENCOUNTER — Other Ambulatory Visit: Payer: Self-pay | Admitting: Physician Assistant

## 2017-09-15 DIAGNOSIS — I10 Essential (primary) hypertension: Secondary | ICD-10-CM

## 2017-09-17 NOTE — Telephone Encounter (Signed)
Ov 04/10/17 RTC 43yr

## 2017-12-21 ENCOUNTER — Other Ambulatory Visit: Payer: Self-pay | Admitting: Cardiology

## 2017-12-21 MED ORDER — METOPROLOL TARTRATE 50 MG PO TABS: 50.0000 mg | ORAL_TABLET | Freq: Two times a day (BID) | ORAL | 0 refills | Status: DC

## 2017-12-21 NOTE — Telephone Encounter (Signed)
Pt's medication was sent to pt's pharmacy as requested. Confirmation received.  °

## 2017-12-21 NOTE — Telephone Encounter (Signed)
° ° °*  STAT* If patient is at the pharmacy, call can be transferred to refill team.   1. Which medications need to be refilled? (please list name of each medication and dose if known) metoprolol tartrate (LOPRESSOR) 50 MG tablet  2. Which pharmacy/location (including street and city if local pharmacy) is medication to be sent to? [place], [address]  3. Do they need a 30 day or 90 day supply? [place]

## 2017-12-28 ENCOUNTER — Encounter: Payer: Self-pay | Admitting: Family Medicine

## 2017-12-28 ENCOUNTER — Ambulatory Visit: Payer: Commercial Managed Care - PPO | Admitting: Family Medicine

## 2017-12-28 VITALS — BP 140/90 | HR 87 | Temp 98.2°F | Ht 72.0 in | Wt 292.0 lb

## 2017-12-28 DIAGNOSIS — I10 Essential (primary) hypertension: Secondary | ICD-10-CM | POA: Diagnosis not present

## 2017-12-28 DIAGNOSIS — Z114 Encounter for screening for human immunodeficiency virus [HIV]: Secondary | ICD-10-CM

## 2017-12-28 DIAGNOSIS — Z23 Encounter for immunization: Secondary | ICD-10-CM | POA: Diagnosis not present

## 2017-12-28 DIAGNOSIS — R5383 Other fatigue: Secondary | ICD-10-CM | POA: Diagnosis not present

## 2017-12-28 DIAGNOSIS — Z1211 Encounter for screening for malignant neoplasm of colon: Secondary | ICD-10-CM | POA: Diagnosis not present

## 2017-12-28 DIAGNOSIS — Z9989 Dependence on other enabling machines and devices: Secondary | ICD-10-CM

## 2017-12-28 DIAGNOSIS — G4733 Obstructive sleep apnea (adult) (pediatric): Secondary | ICD-10-CM

## 2017-12-28 DIAGNOSIS — Z1159 Encounter for screening for other viral diseases: Secondary | ICD-10-CM

## 2017-12-28 NOTE — Progress Notes (Signed)
Subjective: CC: HTN, HLD, CAD, Preventative PCP: Scott Sleeper, PA-C B[facility] [name] is a 57 y.o. male presenting to clinic today for:  1.  Hypertension with hyperlipidemia and history of CAD status post CABG Patient reports history of MI.  He is followed by Dr. Percival Hatfield and has an upcoming appointment in about 1 month.  He reports compliance with his blood pressure medications and with his atorvastatin but is not sure if he is taking the amlodipine.  He does report intermittent sharp left-sided chest pain that is not sustained nor related to activities.  Denies any associated nausea, vomiting, shortness of breath or diaphoresis.  He notes that he does not feel like when he had his MI on the past, which radiated to his left upper extremity.  He does report chronic lower extremity edema that seems worse in the evenings.  He does not add much salt his food but does admit to eating foods containing salt.  He drinks many "drinks" daily that are diet.  He has also history of obstructive sleep apnea and reports compliance with his CPAP machine every night.  Initially, the CPAP resulted in excellent sleep but lately, he feels like he has had poor energy.  He does report weight gain and states that his last sleep study was about 2 years ago.  He attributes weight gain to decreased physical activity.  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  He does note some dyspnea on exertion citing that going to the dog kennel sometimes will even have him winded.  No orthopnea noted.  Denies any known family history of thyroid disease, autoimmune disease.  2.  Preventative care Patient has never had a screening colonoscopy.  He denies any rectal bleeding, including hematochezia and melena.  No abdominal pain.  No unplanned weight loss.  No nausea, vomiting, change in appetite.   Denies any family history of colon cancer, prostate cancer, ovarian cancer or breast cancers.  He needs his flu shot and tetanus shot today.   ROS: Per HPI  No Known Allergies Past Medical History:  Diagnosis Date  . CAD in native artery    a. 2008 - Single-vessel CAD with stent placement to the circumflex  b. 2012 - DES to Diag.  c. 06/2014 - RCA 25% stenosis, LAD 30% stenosis, ramus intermediate 45% stenosis, D1 95% stenosis treated with DES  d.01/2015 - nonobstructive CAD  . GERD (gastroesophageal reflux disease)   . Hyperlipidemia, mixed   . Hypertension   . Overweight(278.02)   . Sleep apnea    CPAP    Current Outpatient Medications:  .  aspirin 81 MG tablet, Take 81 mg by mouth daily.  , Disp: , Rfl:  .  atorvastatin (LIPITOR) 40 MG tablet, TAKE 1 TABLET BY MOUTH ONCE DAILY AT  6  PM, Disp: 90 tablet, Rfl: 3 .  gabapentin (NEURONTIN) 100 MG capsule, Take 1-3 capsules (100-300 mg total) by mouth at bedtime., Disp: 90 capsule, Rfl: 5 .  hydrochlorothiazide (MICROZIDE) 12.5 MG capsule, TAKE 2 CAPSULES BY MOUTH ONCE DAILY, Disp: 180 capsule, Rfl: 1 .  lisinopril (PRINIVIL,ZESTRIL) 40 MG tablet, Take 1 tablet (40 mg total) by mouth daily., Disp: 90 tablet, Rfl: 3 .  metoprolol tartrate (LOPRESSOR) 50 MG tablet, Take 1 tablet (50 mg total) by mouth 2 (two) times daily. Please keep  upcoming appt in December with Dr.Hochrein for future refillsThank you, Disp: 180 tablet, Rfl: 0 .  nitroGLYCERIN (NITROSTAT) 0.4 MG SL tablet, Place 1 tablet (0.4 mg total) under the tongue every 5 (five) minutes as needed. May repeat for up to 3 doses., Disp: 25 tablet, Rfl: prn Social History   Socioeconomic History  . Marital status: Married    Spouse name: Not on file  . Number of children: Not on file  . Years of education: Not on file  . Highest education level: Not on file  Occupational History  . Occupation: Full time    Employer: [employer]  Social Needs  . Financial resource strain:  Not on file  . Food insecurity:    Worry: Not on file    Inability: Not on file  . Transportation needs:    Medical: Not on file    Non-medical: Not on file  Tobacco Use  . Smoking status: Current Some Day Smoker    Start date: 03/20/2012  . Smokeless tobacco: Never Used  Substance and Sexual Activity  . Alcohol use: Yes    Alcohol/week: 0.0 standard drinks  . Drug use: No  . Sexual activity: Not on file  Lifestyle  . Physical activity:    Days per week: Not on file    Minutes per session: Not on file  . Stress: Not on file  Relationships  . Social connections:    Talks on phone: Not on file    Gets together: Not on file    Attends religious service: Not on file    Active member of club or organization: Not on file    Attends meetings of clubs or organizations: Not on file    Relationship status: Not on file  . Intimate partner violence:    Fear of current or ex partner: Not on file    Emotionally abused: Not on file    Physically abused: Not on file    Forced sexual activity: Not on file  Other Topics Concern  . Not on file  Social History Narrative   Married   No regular exercise   Family History  Problem Relation Age of Onset  . Heart disease Mother   . Coronary artery disease Other     Objective: Office vital signs reviewed. BP 140/90 Comment: manual  Pulse 87   Temp 98.2 F (36.8 C) (Oral)   Ht 6' (1.829 m)   Wt 292 lb (132.5 kg)   BMI 39.60 kg/m   Physical Examination:  General: Awake, alert, obese, No acute distress HEENT: Normal    Neck: No JVD; no goiter    Eyes: PERRLA, extraocular membranes intact, sclera white; no exophthalmos    Throat: moist mucus membranes Cardio: regular rate and rhythm, S1S2 heard, no murmurs appreciated Pulm: clear to auscultation bilaterally, no wheezes, rhonchi or rales; normal work of breathing on room air Extremities: warm, well perfused, trace minimally pitting edema to mid shins.  No cyanosis or clubbing; +2  pulses bilaterally MSK: normal gait and station Skin: dry; intact; no rashes or lesions  Assessment/ Plan: 57 y.o. male   1. Essential hypertension Borderline uncontrolled.  Really for history of CAD and MI I would like him to be closer to 120s over 23s.  We discussed DASH diet and diet modification today.  He identified sodas as a leading cause of excess sodium intake.  He will work on reduction of this.  He has follow-up with cardiology.  At this time, I  do not think that the intermittent sensation in his chest is cardiac related but we discussed that if it becomes sustained or company by any other red flag symptoms he should seek immediate medical attention. - Basic Metabolic Panel  2. Screening for malignant neoplasm of colon No known family history of colon cancer.  Patient currently not demonstrating any red flag symptoms or signs.  Prefers to be seen at [facility] if possible.  We discussed that he may need to have cardiac clearance prior to undergoing sedation for colonoscopy. - Ambulatory referral to Gastroenterology  3. OSA on CPAP I question if he needs titration of his pressures, particularly given reports of weight gain.  We will plan referral to sleep studies for further evaluation of this. - Ambulatory referral to Sleep Studies  4. Fatigue, unspecified type Possibly related to inadequate CPAP pressures.  Will check CBC and TSH for completion. - TSH - CBC - Basic Metabolic Panel  5. Morbid obesity ([place]) As above.  We discussed DASH diet and increasing physical activity. - TSH  6. Encounter for hepatitis C screening test for low risk patient No known exposure. - Hepatitis C antibody  7. Screening for HIV (human immunodeficiency virus) No known exposure. - HIV antibody (with reflex)   Orders Placed This Encounter  Procedures  . TSH  . CBC  . Basic Metabolic Panel  . Hepatitis C antibody  . HIV antibody (with reflex)  . Ambulatory referral to Gastroenterology     Referral Priority:   Routine    Referral Type:   Consultation    Referral Reason:   Specialty Services Required    Number of Visits Requested:   1  . Ambulatory referral to Sleep Studies    Referral Priority:   Routine    Referral Type:   Consultation    Referral Reason:   Specialty Services Required    Number of Visits Requested:   1   No orders of the defined types were placed in this encounter.    Janora Norlander, DO [place] [phone]

## 2017-12-28 NOTE — Patient Instructions (Signed)
You had labs performed today.  You will be contacted with the results of the labs once they are available, usually in the next 3 business days for routine lab work.   Compression hose for leg swelling.  Reduce salt.  DASH Eating Plan DASH stands for "Dietary Approaches to Stop Hypertension." The DASH eating plan is a healthy eating plan that has been shown to reduce high blood pressure (hypertension). It may also reduce your risk for type 2 diabetes, heart disease, and stroke. The DASH eating plan may also help with weight loss. What are tips for following this plan? General guidelines  Avoid eating more than 2,300 mg (milligrams) of salt (sodium) a day. If you have hypertension, you may need to reduce your sodium intake to 1,500 mg a day.  Limit alcohol intake to no more than 1 drink a day for nonpregnant women and 2 drinks a day for men. One drink equals 12 oz of beer, 5 oz of wine, or 1 oz of hard liquor.  Work with your health care provider to maintain a healthy body weight or to lose weight. Ask what an ideal weight is for you.  Get at least 30 minutes of exercise that causes your heart to beat faster (aerobic exercise) most days of the week. Activities may include walking, swimming, or biking.  Work with your health care provider or diet and nutrition specialist (dietitian) to adjust your eating plan to your individual calorie needs. Reading food labels  Check food labels for the amount of sodium per serving. Choose foods with less than 5 percent of the Daily Value of sodium. Generally, foods with less than 300 mg of sodium per serving fit into this eating plan.  To find whole grains, look for the word "whole" as the first word in the ingredient list. Shopping  Buy products labeled as "low-sodium" or "no salt added."  Buy fresh foods. Avoid canned foods and premade or frozen meals. Cooking  Avoid adding salt when cooking. Use salt-free seasonings or herbs instead of table salt or  sea salt. Check with your health care provider or pharmacist before using salt substitutes.  Do not fry foods. Cook foods using healthy methods such as baking, boiling, grilling, and broiling instead.  Cook with heart-healthy oils, such as olive, canola, soybean, or sunflower oil. Meal planning   Eat a balanced diet that includes: ? 5 or more servings of fruits and vegetables each day. At each meal, try to fill half of your plate with fruits and vegetables. ? Up to 6-8 servings of whole grains each day. ? Less than 6 oz of lean meat, poultry, or fish each day. A 3-oz serving of meat is about the same size as a deck of cards. One egg equals 1 oz. ? 2 servings of low-fat dairy each day. ? A serving of nuts, seeds, or beans 5 times each week. ? Heart-healthy fats. Healthy fats called Omega-3 fatty acids are found in foods such as flaxseeds and coldwater fish, like sardines, salmon, and mackerel.  Limit how much you eat of the following: ? Canned or prepackaged foods. ? Food that is high in trans fat, such as fried foods. ? Food that is high in saturated fat, such as fatty meat. ? Sweets, desserts, sugary drinks, and other foods with added sugar. ? Full-fat dairy products.  Do not salt foods before eating.  Try to eat at least 2 vegetarian meals each week.  Eat more home-cooked food and less restaurant, buffet,  and fast food.  When eating at a restaurant, ask that your food be prepared with less salt or no salt, if possible. What foods are recommended? The items listed may not be a complete list. Talk with your dietitian about what dietary choices are best for you. Grains Whole-grain or whole-wheat bread. Whole-grain or whole-wheat pasta. Brown rice. Modena Morrow. Bulgur. Whole-grain and low-sodium cereals. Pita bread. Low-fat, low-sodium crackers. Whole-wheat flour tortillas. Vegetables Fresh or frozen vegetables (raw, steamed, roasted, or grilled). Low-sodium or reduced-sodium  tomato and vegetable juice. Low-sodium or reduced-sodium tomato sauce and tomato paste. Low-sodium or reduced-sodium canned vegetables. Fruits All fresh, dried, or frozen fruit. Canned fruit in natural juice (without added sugar). Meat and other protein foods Skinless chicken or [place]. Ground chicken or [place]. Pork with fat trimmed off. Fish and seafood. Egg whites. Dried beans, peas, or lentils. Unsalted nuts, nut butters, and seeds. Unsalted canned beans. Lean cuts of beef with fat trimmed off. Low-sodium, lean deli meat. Dairy Low-fat (1%) or fat-free (skim) milk. Fat-free, low-fat, or reduced-fat cheeses. Nonfat, low-sodium ricotta or cottage cheese. Low-fat or nonfat yogurt. Low-fat, low-sodium cheese. Fats and oils Soft margarine without trans fats. Vegetable oil. Low-fat, reduced-fat, or light mayonnaise and salad dressings (reduced-sodium). Canola, safflower, olive, soybean, and sunflower oils. Avocado. Seasoning and other foods Herbs. Spices. Seasoning mixes without salt. Unsalted popcorn and pretzels. Fat-free sweets. What foods are not recommended? The items listed may not be a complete list. Talk with your dietitian about what dietary choices are best for you. Grains Baked goods made with fat, such as croissants, muffins, or some breads. Dry pasta or rice meal packs. Vegetables Creamed or fried vegetables. Vegetables in a cheese sauce. Regular canned vegetables (not low-sodium or reduced-sodium). Regular canned tomato sauce and paste (not low-sodium or reduced-sodium). Regular tomato and vegetable juice (not low-sodium or reduced-sodium). Angie Fava. Olives. Fruits Canned fruit in a light or heavy syrup. Fried fruit. Fruit in cream or butter sauce. Meat and other protein foods Fatty cuts of meat. Ribs. Fried meat. Berniece Salines. Sausage. Bologna and other processed lunch meats. Salami. Fatback. Hotdogs. Bratwurst. Salted nuts and seeds. Canned beans with added salt. Canned or smoked fish.  Whole eggs or egg yolks. Chicken or [place] with skin. Dairy Whole or 2% milk, cream, and half-and-half. Whole or full-fat cream cheese. Whole-fat or sweetened yogurt. Full-fat cheese. Nondairy creamers. Whipped toppings. Processed cheese and cheese spreads. Fats and oils Butter. Stick margarine. Lard. Shortening. Ghee. Bacon fat. Tropical oils, such as coconut, palm kernel, or palm oil. Seasoning and other foods Salted popcorn and pretzels. Onion salt, garlic salt, seasoned salt, table salt, and sea salt. Worcestershire sauce. Tartar sauce. Barbecue sauce. Teriyaki sauce. Soy sauce, including reduced-sodium. Steak sauce. Canned and packaged gravies. Fish sauce. Oyster sauce. Cocktail sauce. Horseradish that you find on the shelf. Ketchup. Mustard. Meat flavorings and tenderizers. Bouillon cubes. Hot sauce and Tabasco sauce. Premade or packaged marinades. Premade or packaged taco seasonings. Relishes. Regular salad dressings. Where to find more information:  National Heart, Lung, and [place]: https://wilson-eaton.com/  American Heart Association: www.heart.org Summary  The DASH eating plan is a healthy eating plan that has been shown to reduce high blood pressure (hypertension). It may also reduce your risk for type 2 diabetes, heart disease, and stroke.  With the DASH eating plan, you should limit salt (sodium) intake to 2,300 mg a day. If you have hypertension, you may need to reduce your sodium intake to 1,500 mg a day.  When on  the DASH eating plan, aim to eat more fresh fruits and vegetables, whole grains, lean proteins, low-fat dairy, and heart-healthy fats.  Work with your health care provider or diet and nutrition specialist (dietitian) to adjust your eating plan to your individual calorie needs. This information is not intended to replace advice given to you by your health care provider. Make sure you discuss any questions you have with your health care provider. Document Released:  01/19/2011 Document Revised: 01/24/2016 Document Reviewed: 01/24/2016 Elsevier Interactive Patient Education  [employer].

## 2017-12-29 LAB — HEPATITIS C ANTIBODY

## 2017-12-29 LAB — CBC
HEMATOCRIT: 50.3 % (ref 37.5–51.0)
Hemoglobin: 17.2 g/dL (ref 13.0–17.7)
MCH: 32 pg (ref 26.6–33.0)
MCHC: 34.2 g/dL (ref 31.5–35.7)
MCV: 94 fL (ref 79–97)
PLATELETS: 275 10*3/uL (ref 150–450)
RBC: 5.37 x10E6/uL (ref 4.14–5.80)
RDW: 11.7 % — AB (ref 12.3–15.4)
WBC: 8.8 10*3/uL (ref 3.4–10.8)

## 2017-12-29 LAB — BASIC METABOLIC PANEL
BUN / CREAT RATIO: 14 (ref 9–20)
BUN: 13 mg/dL (ref 6–24)
CHLORIDE: 103 mmol/L (ref 96–106)
CO2: 22 mmol/L (ref 20–29)
CREATININE: 0.91 mg/dL (ref 0.76–1.27)
Calcium: 9.2 mg/dL (ref 8.7–10.2)
GFR calc Af Amer: 108 mL/min/{1.73_m2} (ref >59)
GFR calc non Af Amer: 93 mL/min/{1.73_m2} (ref >59)
GLUCOSE: 90 mg/dL (ref 65–99)
Potassium: 4 mmol/L (ref 3.5–5.2)
SODIUM: 139 mmol/L (ref 134–144)

## 2017-12-29 LAB — HIV ANTIBODY (ROUTINE TESTING W REFLEX): HIV SCREEN 4TH GENERATION: NONREACTIVE

## 2017-12-29 LAB — TSH: TSH: 3.26 u[IU]/mL (ref 0.450–4.500)

## 2018-01-29 NOTE — Progress Notes (Signed)
HPI Patient presents for evaluation of coronary artery disease. He had a history of circumflex stenting in another town in 2008. He presented urgently in 10/12 with unstable angina and ST segment elevation and was found to have an occluded diagonal. He had a drug-eluting stent placed.  He was in the hospital in May of 2016 with unstable angina and was found to have 95% stenosis in the diagonal again treated with a drug-eluting stent.  He was again in the hospital in Dec of 2016.   He again had chest pain but cath at that time demonstrated non obstructive disease with patent stents.  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  He was managed medically.   Since I last saw him he has done well.  He stays active cutting wood and doing other things around the house.  He has had some chronic fatigue but this is unchanged.  He has had no chest pressure that he recalls.  He might of taken a couple of sublingual nitroglycerin in the last year.  He is not having any new shortness of breath, PND orthopnea.  He is not having any new palpitations, presyncope or syncope.  He has some mild ankle edema.   No Known Allergies  Current Outpatient Medications  Medication Sig Dispense Refill  . amLODipine (NORVASC) 10 MG tablet Take 1 tablet (10 mg total) by mouth daily. 90 tablet 3  . aspirin 81 MG tablet Take 81 mg by mouth daily.      Marland Kitchen atorvastatin (LIPITOR) 40 MG tablet TAKE 1 TABLET BY MOUTH ONCE DAILY AT  6  PM 90 tablet 3  . hydrochlorothiazide (MICROZIDE) 12.5 MG capsule TAKE 2 CAPSULES BY MOUTH ONCE DAILY 180 capsule 1  . lisinopril (PRINIVIL,ZESTRIL) 40 MG tablet Take 1 tablet (40 mg total) by mouth daily. 90 tablet 3  . metoprolol tartrate (LOPRESSOR) 50 MG tablet Take 1 tablet (50 mg total) by mouth 2 (two) times daily. Please keep upcoming appt in December with Dr.Phillippa Straub for future refillsThank you 180 tablet 0  . nitroGLYCERIN (NITROSTAT) 0.4 MG SL tablet Place 1  tablet (0.4 mg total) under the tongue every 5 (five) minutes as needed. May repeat for up to 3 doses. 25 tablet prn   No current facility-administered medications for this visit.     Past Medical History:  Diagnosis Date  . CAD in native artery    a. 2008 - Single-vessel CAD with stent placement to the circumflex  b. 2012 - DES to Diag.  c. 06/2014 - RCA 25% stenosis, LAD 30% stenosis, ramus intermediate 45% stenosis, D1 95% stenosis treated with DES  d.01/2015 - nonobstructive CAD  . GERD (gastroesophageal reflux disease)   . Hyperlipidemia, mixed   . Hypertension   . Overweight(278.02)   . Sleep apnea    CPAP    Past Surgical History:  Procedure Laterality Date  . CARDIAC CATHETERIZATION N/A 07/03/2014   Procedure: Left Heart Cath and Coronary Angiography;  Surgeon: Peter M Martinique, MD;  Location: [facility];  Service: Cardiovascular;  Laterality: N/A;  . CARDIAC CATHETERIZATION N/A 07/03/2014   Procedure: Coronary Stent Intervention;  Surgeon: Peter M Martinique, MD;  Location: [facility];  Service: Cardiovascular;  Laterality: N/A;  . CARDIAC CATHETERIZATION N/A 01/26/2015   Procedure: Left Heart Cath and Coronary Angiography;  Surgeon: Peter M Martinique, MD;  Location: [facility];  Service: Cardiovascular;  Laterality: N/A;  . CORONARY STENT  PLACEMENT  2008, 2012   ROS:  As stated in the HPI and negative for all other systems.   PHYSICAL EXAM BP (!) 154/90   Pulse 79   Ht 6' (1.829 m)   Wt 290 lb (131.5 kg)   BMI 39.33 kg/m   GENERAL:  Well appearing NECK:  No jugular venous distention, waveform within normal limits, carotid upstroke brisk and symmetric, no bruits, no thyromegaly LUNGS:  Clear to auscultation bilaterally CHEST:  Unremarkable HEART:  PMI not displaced or sustained,S1 and S2 within normal limits, no S3, no S4, no clicks, no rubs, no murmurs ABD:  Flat, positive bowel sounds normal in frequency in pitch, no bruits, no rebound, no guarding,  no midline pulsatile mass, no hepatomegaly, no splenomegaly EXT:  2 plus pulses throughout, no edema, no cyanosis no clubbing     EKG:  Sinus rhythm, rate 79, axis within normal limits, intervals within normal limits, no acute ST-T wave changes.  01/30/2018  Lab Results  Component Value Date   CHOL 151 04/10/2017   TRIG 136 04/10/2017   HDL 39 (L) 04/10/2017   LDLCALC 85 04/10/2017    ASSESSMENT AND PLAN   CAD, NATIVE VESSEL -  The patient has no new sypmtoms.  No further cardiovascular testing is indicated.  We will continue with aggressive risk reduction and meds as listed.    HYPERTENSION, UNSPECIFIED -  His blood pressure is not controlled.  I am can increase his Norvasc to 10 mg daily.  HYPERLIPIDEMIA-MIXED -  His LDL was slightly above target.  He is due to have this checked in February.  If he is not below 70 I would suggest that Ronnie Doss M, DO increase his Lipitor to 80 mg daily.   TOBACCO ABUSE -  He is completely off cigarettes for good.  OVERWEIGHT/OBESITY -  We talked again about specific need for weight loss and I gave him a 15 pound goal for weight loss.   SLEEP APNEA -  He is going to have a repeat sleep study as it is been several years.  This may explain his fatigue.  FATIGUE - I reviewed his blood work.  Is not had any new anemia.  His thyroid was fine.  No change in therapy.  He will have his sleep apnea evaluated as above.  I do not see a cardiac etiology for his fatigue.  PREOP - Patient be acceptable risk for his planned colonoscopy.

## 2018-01-30 ENCOUNTER — Encounter: Payer: Self-pay | Admitting: Cardiology

## 2018-01-30 ENCOUNTER — Ambulatory Visit (INDEPENDENT_AMBULATORY_CARE_PROVIDER_SITE_OTHER): Payer: Commercial Managed Care - PPO | Admitting: Cardiology

## 2018-01-30 VITALS — BP 154/90 | HR 79 | Ht 72.0 in | Wt 290.0 lb

## 2018-01-30 DIAGNOSIS — R5383 Other fatigue: Secondary | ICD-10-CM | POA: Diagnosis not present

## 2018-01-30 DIAGNOSIS — E785 Hyperlipidemia, unspecified: Secondary | ICD-10-CM | POA: Diagnosis not present

## 2018-01-30 DIAGNOSIS — I2510 Atherosclerotic heart disease of native coronary artery without angina pectoris: Secondary | ICD-10-CM

## 2018-01-30 DIAGNOSIS — I10 Essential (primary) hypertension: Secondary | ICD-10-CM | POA: Diagnosis not present

## 2018-01-30 MED ORDER — AMLODIPINE BESYLATE 10 MG PO TABS: 10.0000 mg | ORAL_TABLET | Freq: Every day | ORAL | 3 refills | Status: DC

## 2018-01-30 NOTE — Patient Instructions (Signed)
Medication Instructions:  Please increase your Amlodipine to 10 mg a day. Continue all other medications as listed.  If you need a refill on your cardiac medications before your next appointment, please call your pharmacy.   Follow-Up: Follow up in 1 year with Dr. Percival Spanish.  You will receive a letter in the mail 2 months before you are due.  Please call us when you receive this letter to schedule your follow up appointment.  Thank you for choosing [facility]!!

## 2018-03-20 ENCOUNTER — Ambulatory Visit: Payer: Commercial Managed Care - PPO | Admitting: Neurology

## 2018-03-20 ENCOUNTER — Encounter: Payer: Self-pay | Admitting: Neurology

## 2018-03-20 VITALS — BP 191/108 | HR 90 | Ht 72.0 in | Wt 287.0 lb

## 2018-03-20 DIAGNOSIS — Z9989 Dependence on other enabling machines and devices: Secondary | ICD-10-CM | POA: Diagnosis not present

## 2018-03-20 DIAGNOSIS — G4733 Obstructive sleep apnea (adult) (pediatric): Secondary | ICD-10-CM | POA: Diagnosis not present

## 2018-03-20 NOTE — Patient Instructions (Signed)
I will request new supplies for your CPAP.  Please make an appointment with your DME company, [employer] for a mask refit and explain your mask and headgear issues, mouth dryness, air leak ets. Please have them explain how to change the humidifier setting and ramp time.   Please continue using your CPAP regularly. While your insurance requires that you use CPAP at least 4 hours each night on 70% of the nights, I recommend, that you not skip any nights and use it throughout the night if you can. Getting used to CPAP and staying with the treatment long term does take time and patience and discipline. Untreated obstructive sleep apnea when it is moderate to severe can have an adverse impact on cardiovascular health and raise her risk for heart disease, arrhythmias, hypertension, congestive heart failure, stroke and diabetes. Untreated obstructive sleep apnea causes sleep disruption, nonrestorative sleep, and sleep deprivation. This can have an impact on your day to day functioning and cause daytime sleepiness and impairment of cognitive function, memory loss, mood disturbance, and problems focussing. Using CPAP regularly can improve these symptoms.  Keep up the good work!   We can see you in 1 year, you can see one of our nurse practitioners as you are stable. I will see you after that.

## 2018-03-20 NOTE — Progress Notes (Signed)
Subjective:    Patient ID: Scott Hatfield is a 58 y.o. male.  HPI     Star Age, MD, PhD [facility] [address], [address] [place], [place] [address]  Dear Dr. Lajuana Ripple,  I saw your patient, Scott Hatfield, upon your kind request in my sleep clinic today for initial consultation of his sleep disorder and reevaluation of his prior diagnosis of OSA. The patient is unaccompanied today. As you know, Scott Hatfield is a 58 year old right-handed gentleman with an underlying medical history of coronary artery disease with status post stent placements, hypertension, hyperlipidemia, and obesity, who was previously diagnosed with obstructive sleep apnea and placed on CPAP therapy. Prior sleep study results were reviewed today. He had a nocturnal polysomnogram on 03/15/2012. Sleep efficiency was 73%, sleep latency 7.5 minutes, REM latency 110 minutes. REM percentage was 6%, average oxygen saturation 93%, nadir was 85%. AHI was 12 per hour. He had no significant PLMS. Study was interpreted by Dr. Merlene Laughter. He had a subsequent CPAP titration study on 07/07/2012. Sleep efficiency was 90%, sleep latency 14 minutes, REM latency 34 minutes and REM percentage 21%. Baseline oxygen saturation 94%, nadir was 87%. He was on CPAP starting at a pressure of 5 cm and titrated to a pressure of 12 cm. A home treatment pressure between 10 and 12 cm was recommended, study was interpreted by Dr. Merlene Laughter. I reviewed his CPAP compliance data from the last 30 days, which shows 100% usage and 100% compliance for more than 4 hours, average usage of 8 hours and 31 minutes, residual AHI at goal at 0.6 per hour, leak on the high side consistently with the 95th percentile at 46.3 L/m on a pressure of 10 cm with EPR of 3. He reports being fully compliant with his CPAP, in fact, he feels he cannot sleep without it. I reviewed your office note from 12/28/2017.  He lives with his wife, has 2 stepchildren,  1 biological. He has nocturia, about once a night, if at all. He denies AM HAs, denies a FHx of OSA. He works as a Administrator. He has a DOT physical and has to have a stress test every other year. His Epworth sleepiness score is 9 out of 24, fatigue score is 28 out of 63. Bedtime is around 8:30 and rise time around 4:30.  He is using a Quatro full facemask from [employer], size medium. He previously had a large. He feels that the medium sits a little better than the large size but would be open to exploring different mask options but feels he is a mouth breather. Sometimes he has mouth dryness and sometimes the ear has blown into his eyes. He does not always hydrate perfectly he admits. He averages about 2 bottles of water per day. He quit smoking in 2018, he drinks alcohol infrequently, maybe once a week, caffeine daily in the form of coffee, 2 cups in the mornings and 3 cans of soda, Mountain Dew typically. They have 1 dog in the household, 2 outside dogs.  His Past Medical History Is Significant For: Past Medical History:  Diagnosis Date  . CAD in native artery    a. 2008 - Single-vessel CAD with stent placement to the circumflex  b. 2012 - DES to Diag.  c. 06/2014 - RCA 25% stenosis, LAD 30% stenosis, ramus intermediate 45% stenosis, D1 95% stenosis treated with DES  d.01/2015 - nonobstructive CAD  . GERD (gastroesophageal reflux disease)   . Hyperlipidemia, mixed   .  Hypertension   . Overweight(278.02)   . Sleep apnea    CPAP    His Past Surgical History Is Significant For: Past Surgical History:  Procedure Laterality Date  . CARDIAC CATHETERIZATION N/A 07/03/2014   Procedure: Left Heart Cath and Coronary Angiography;  Surgeon: Peter M Martinique, MD;  Location: [place] [facility];  Service: Cardiovascular;  Laterality: N/A;  . CARDIAC CATHETERIZATION N/A 07/03/2014   Procedure: Coronary Stent Intervention;  Surgeon: Peter M Martinique, MD;  Location: [place] [facility];  Service: Cardiovascular;   Laterality: N/A;  . CARDIAC CATHETERIZATION N/A 01/26/2015   Procedure: Left Heart Cath and Coronary Angiography;  Surgeon: Peter M Martinique, MD;  Location: [place];  Service: Cardiovascular;  Laterality: N/A;  . CORONARY STENT PLACEMENT  2008, 2012    His Family History Is Significant For: Family History  Problem Relation Age of Onset  . Heart disease Mother   . Coronary artery disease Other     His Social History Is Significant For: Social History   Socioeconomic History  . Marital status: Married    Spouse name: Not on file  . Number of children: Not on file  . Years of education: Not on file  . Highest education level: Not on file  Occupational History  . Occupation: Full time    Employer: [employer]  Social Needs  . Financial resource strain: Not on file  . Food insecurity:    Worry: Not on file    Inability: Not on file  . Transportation needs:    Medical: Not on file    Non-medical: Not on file  Tobacco Use  . Smoking status: Former Smoker    Start date: 03/20/2012    Last attempt to quit: 01/30/2017    Years since quitting: 1.1  . Smokeless tobacco: Never Used  Substance and Sexual Activity  . Alcohol use: Yes    Alcohol/week: 0.0 standard drinks  . Drug use: No  . Sexual activity: Not on file  Lifestyle  . Physical activity:    Days per week: Not on file    Minutes per session: Not on file  . Stress: Not on file  Relationships  . Social connections:    Talks on phone: Not on file    Gets together: Not on file    Attends religious service: Not on file    Active member of club or organization: Not on file    Attends meetings of clubs or organizations: Not on file    Relationship status: Not on file  Other Topics Concern  . Not on file  Social History Narrative   Married   No regular exercise    His Allergies Are:  No Known Allergies:   His Current Medications Are:  Outpatient Encounter Medications as of 03/20/2018  Medication  Sig  . amLODipine (NORVASC) 10 MG tablet Take 1 tablet (10 mg total) by mouth daily.  Marland Kitchen aspirin 81 MG tablet Take 81 mg by mouth daily.    Marland Kitchen atorvastatin (LIPITOR) 40 MG tablet TAKE 1 TABLET BY MOUTH ONCE DAILY AT  6  PM  . hydrochlorothiazide (MICROZIDE) 12.5 MG capsule TAKE 2 CAPSULES BY MOUTH ONCE DAILY  . lisinopril (PRINIVIL,ZESTRIL) 40 MG tablet Take 1 tablet (40 mg total) by mouth daily.  . metoprolol tartrate (LOPRESSOR) 50 MG tablet Take 1 tablet (50 mg total) by mouth 2 (two) times daily. Please keep upcoming appt in December with Dr.Hochrein for future refillsThank you  . nitroGLYCERIN (  NITROSTAT) 0.4 MG SL tablet Place 1 tablet (0.4 mg total) under the tongue every 5 (five) minutes as needed. May repeat for up to 3 doses.   No facility-administered encounter medications on file as of 03/20/2018.   :  Review of Systems:  Out of a complete 14 point review of systems, all are reviewed and negative with the exception of these symptoms as listed below:  Review of Systems  Neurological:       Pt presents today to discuss his cpap. Pt uses his cpap nightly but needs a sleep provider. Pt has been feeling more sleepy lately. Pt received this machine in 05/2015. Pt's DME is [facility].  Epworth Sleepiness Scale 0= would never doze 1= slight chance of dozing 2= moderate chance of dozing 3= high chance of dozing  Sitting and reading: 1 Watching TV: 2 Sitting inactive in a public place (ex. Theater or meeting): 1 As a passenger in a car for an hour without a break: 1 Lying down to rest in the afternoon: 3 Sitting and talking to someone: 0 Sitting quietly after lunch (no alcohol): 1 In a car, while stopped in traffic: 0 Total: 9     Objective:  Neurological Exam  Physical Exam Physical Examination:   Vitals:   03/20/18 1520  BP: (!) 191/108  Pulse: 90    General Examination: The patient is a very pleasant 58 y.o. male in no acute distress. He appears well-developed and  well-nourished and well groomed.   HEENT: Normocephalic, atraumatic, pupils are equal, round and reactive to light and accommodation. Funduscopic exam is normal with sharp disc margins noted. Extraocular tracking is good without limitation to gaze excursion or nystagmus noted. Normal smooth pursuit is noted. Hearing is grossly intact. Tympanic membranes are clear bilaterally. Face is symmetric with normal facial animation and normal facial sensation. Speech is clear with no dysarthria noted. There is no hypophonia. There is no lip, neck/head, jaw or voice tremor. Neck is supple with full range of passive and active motion. There are no carotid bruits on auscultation. Oropharynx exam reveals: moderate mouth dryness, adequate dental hygiene and marked airway crowding, due to thicker soft palate, tonsils in place of 2+, large uvula. Smaller airway entry noted. Mallampati is class II. Neck circumference is 18-1/2 inches. Tongue protrudes centrally.   Chest: Clear to auscultation without wheezing, rhonchi or crackles noted.  Heart: S1+S2+0, regular and normal without murmurs, rubs or gallops noted.   Abdomen: Soft, non-tender and non-distended with normal bowel sounds appreciated on auscultation.  Extremities: There is trace pitting edema in the distal lower extremities bilaterally.  Skin: Warm and dry without trophic changes noted.  Musculoskeletal: exam reveals no obvious joint deformities, tenderness or joint swelling or erythema.   Neurologically:  Mental status: The patient is awake, alert and oriented in all 4 spheres. His immediate and remote memory, attention, language skills and fund of knowledge are appropriate. There is no evidence of aphasia, agnosia, apraxia or anomia. Speech is clear with normal prosody and enunciation. Thought process is linear. Mood is normal and affect is normal.  Cranial nerves II - XII are as described above under HEENT exam. In addition: shoulder shrug is normal with  equal shoulder height noted. Motor exam: Normal bulk, strength and tone is noted. There is no tremor. Fine motor skills and coordination: grossly intact.  Cerebellar testing: No dysmetria or intention tremor. There is no truncal or gait ataxia.  Sensory exam: intact to light touch in the upper and  lower extremities.  Gait, station and balance: He stands easily. No veering to one side is noted. No leaning to one side is noted. Posture is age-appropriate and stance is narrow based. Gait shows normal stride length and normal pace. No problems turning are noted.   Assessment and Plan:  In summary, Scott Hatfield is a very pleasant 58 y.o.-year old male with an underlying medical history of coronary artery disease with status post stent placements, hypertension, hyperlipidemia, and obesity, who presents for evaluation of his obstructive sleep apnea. He was diagnosed about 6 years ago with obstructive sleep apnea and is established on CPAP therapy for nearly 6 years. He received a new machine in April 2017 which is a newer generation machine. He is fully compliant with treatment and highly commended for it. His sleep apnea is under great control but there is room for improvement as far as his mask fit and leak from the mask that go. He needs a new supplies. I ordered new supplies through his current DME company, [facility]. In addition, I suggested he meet with his DME provider for a mask refit. He may want to explore some of the newer fullface mask options. He is agreeable to this. Since he is well established on treatment, his sleep apnea is under good control and he is fully compliant with CPAP therapy we can see him on a yearly basis. I answered all his questions today and he was in agreement.  Thank you very much for allowing me to participate in the care of this nice patient. If I can be of any further assistance to you please do not hesitate to call me at [phone].  Sincerely,   Star Age, MD,  PhD

## 2018-03-20 NOTE — Progress Notes (Signed)
Order for cpap supplies sent to [facility] via fax. Confirmation received that the order transmitted was successful.

## 2018-03-22 ENCOUNTER — Encounter: Payer: Self-pay | Admitting: Gastroenterology

## 2018-03-26 ENCOUNTER — Other Ambulatory Visit: Payer: Self-pay | Admitting: Family Medicine

## 2018-03-26 ENCOUNTER — Encounter: Payer: Self-pay | Admitting: Family Medicine

## 2018-03-26 DIAGNOSIS — I2510 Atherosclerotic heart disease of native coronary artery without angina pectoris: Secondary | ICD-10-CM

## 2018-03-26 DIAGNOSIS — E785 Hyperlipidemia, unspecified: Secondary | ICD-10-CM

## 2018-04-12 ENCOUNTER — Encounter: Payer: Commercial Managed Care - PPO | Admitting: Family Medicine

## 2018-04-12 ENCOUNTER — Ambulatory Visit (AMBULATORY_SURGERY_CENTER): Payer: Self-pay | Admitting: *Deleted

## 2018-04-12 VITALS — Ht 72.0 in | Wt 288.0 lb

## 2018-04-12 DIAGNOSIS — Z1211 Encounter for screening for malignant neoplasm of colon: Secondary | ICD-10-CM

## 2018-04-12 MED ORDER — NA SULFATE-K SULFATE-MG SULF 17.5-3.13-1.6 GM/177ML PO SOLN: 1.0000 | Freq: Once | ORAL | 0 refills | Status: AC

## 2018-04-12 NOTE — Progress Notes (Signed)
No egg or soy allergy known to patient  No issues with past sedation with any surgeries  or procedures, no intubation problems  No diet pills per patient No home 02 use per patient  No blood thinners per patient  Pt denies issues with constipation  No A fib or A flutter  EMMI video sent to pt's e mail - declined Suprep $15 coupon

## 2018-04-26 ENCOUNTER — Encounter: Payer: Self-pay | Admitting: Gastroenterology

## 2018-05-03 ENCOUNTER — Encounter: Payer: Self-pay | Admitting: Gastroenterology

## 2018-05-09 ENCOUNTER — Telehealth: Payer: Self-pay | Admitting: *Deleted

## 2018-05-09 NOTE — Telephone Encounter (Signed)
Cancelled 05/17/18 procedure due to Covid 19. Will call back to reschedule as soon as possible. Pt appreciated phone call and states understanding.

## 2018-05-17 ENCOUNTER — Encounter: Payer: Self-pay | Admitting: Gastroenterology

## 2018-05-19 ENCOUNTER — Other Ambulatory Visit: Payer: Self-pay | Admitting: Physician Assistant

## 2018-05-19 DIAGNOSIS — I10 Essential (primary) hypertension: Secondary | ICD-10-CM

## 2018-05-20 ENCOUNTER — Other Ambulatory Visit: Payer: Self-pay | Admitting: Family Medicine

## 2018-05-20 MED ORDER — HYDROCHLOROTHIAZIDE 25 MG PO TABS: 25.0000 mg | ORAL_TABLET | Freq: Every day | ORAL | 3 refills | Status: DC

## 2018-05-20 NOTE — Telephone Encounter (Signed)
Last office visit 02/27/2017

## 2018-05-29 DIAGNOSIS — G4733 Obstructive sleep apnea (adult) (pediatric): Secondary | ICD-10-CM | POA: Diagnosis not present

## 2018-06-25 ENCOUNTER — Encounter: Payer: Self-pay | Admitting: *Deleted

## 2018-07-01 ENCOUNTER — Other Ambulatory Visit: Payer: Self-pay | Admitting: Family Medicine

## 2018-07-01 ENCOUNTER — Other Ambulatory Visit: Payer: Self-pay | Admitting: Physician Assistant

## 2018-07-01 DIAGNOSIS — Z716 Tobacco abuse counseling: Secondary | ICD-10-CM

## 2018-07-01 DIAGNOSIS — F17200 Nicotine dependence, unspecified, uncomplicated: Secondary | ICD-10-CM

## 2018-07-01 MED ORDER — VARENICLINE TARTRATE 1 MG PO TABS: 1.0000 mg | ORAL_TABLET | Freq: Two times a day (BID) | ORAL | 2 refills | Status: DC

## 2018-07-01 NOTE — Progress Notes (Signed)
Currently smoking 1/2 pack/day and has done so for greater than 30 years now.  No chest pain, shortness of breath or hemoptysis.  He was previously treated with Chantix and wants to go back on the medication.  He had no GI upset with it previously and wishes to simply start on the 1 mg twice daily dosing.  I have sent this into the pharmacy.  He will follow-up in 3 months, sooner if needed  Meds ordered this encounter  Medications  . varenicline (CHANTIX CONTINUING MONTH PAK) 1 MG tablet    Sig: Take 1 tablet (1 mg total) by mouth 2 (two) times daily.    Dispense:  60 tablet    Refill:  2

## 2018-07-09 ENCOUNTER — Telehealth: Payer: Self-pay | Admitting: Family Medicine

## 2018-07-17 ENCOUNTER — Ambulatory Visit: Payer: Commercial Managed Care - PPO | Admitting: Family Medicine

## 2018-07-17 ENCOUNTER — Other Ambulatory Visit: Payer: Self-pay

## 2018-07-23 ENCOUNTER — Telehealth: Payer: Self-pay | Admitting: *Deleted

## 2018-07-23 NOTE — Telephone Encounter (Signed)

## 2018-07-25 ENCOUNTER — Other Ambulatory Visit: Payer: Self-pay

## 2018-07-25 ENCOUNTER — Ambulatory Visit (AMBULATORY_SURGERY_CENTER): Payer: Commercial Managed Care - PPO | Admitting: Gastroenterology

## 2018-07-25 ENCOUNTER — Encounter: Payer: Self-pay | Admitting: Gastroenterology

## 2018-07-25 VITALS — BP 138/82 | HR 70 | Temp 98.4°F | Resp 16 | Ht 72.0 in | Wt 288.0 lb

## 2018-07-25 DIAGNOSIS — Z1211 Encounter for screening for malignant neoplasm of colon: Secondary | ICD-10-CM | POA: Diagnosis present

## 2018-07-25 DIAGNOSIS — D120 Benign neoplasm of cecum: Secondary | ICD-10-CM

## 2018-07-25 DIAGNOSIS — D123 Benign neoplasm of transverse colon: Secondary | ICD-10-CM

## 2018-07-25 DIAGNOSIS — D122 Benign neoplasm of ascending colon: Secondary | ICD-10-CM

## 2018-07-25 MED ORDER — SODIUM CHLORIDE 0.9 % IV SOLN: 500.0000 mL | Freq: Once | INTRAVENOUS | Status: DC

## 2018-07-25 NOTE — Patient Instructions (Signed)
   Information on polyps,diverticulosis ,and hemorrhoids given to you today.  Await pathology results on polyps removed     YOU HAD AN ENDOSCOPIC PROCEDURE TODAY AT [facility]:   Refer to the procedure report that was given to you for any specific questions about what was found during the examination.  If the procedure report does not answer your questions, please call your gastroenterologist to clarify.  If you requested that your care partner not be given the details of your procedure findings, then the procedure report has been included in a sealed envelope for you to review at your convenience later.  YOU SHOULD EXPECT: Some feelings of bloating in the abdomen. Passage of more gas than usual.  Walking can help get rid of the air that was put into your GI tract during the procedure and reduce the bloating. If you had a lower endoscopy (such as a colonoscopy or flexible sigmoidoscopy) you may notice spotting of blood in your stool or on the toilet paper. If you underwent a bowel prep for your procedure, you may not have a normal bowel movement for a few days.  Please Note:  You might notice some irritation and congestion in your nose or some drainage.  This is from the oxygen used during your procedure.  There is no need for concern and it should clear up in a day or so.  SYMPTOMS TO REPORT IMMEDIATELY:   Following lower endoscopy (colonoscopy or flexible sigmoidoscopy):  Excessive amounts of blood in the stool  Significant tenderness or worsening of abdominal pains  Swelling of the abdomen that is new, acute  Fever of 100F or higher    For urgent or emergent issues, a gastroenterologist can be reached at any hour by calling [phone].   DIET:  We do recommend a small meal at first, but then you may proceed to your regular diet.  Drink plenty of fluids but you should avoid alcoholic beverages for 24 hours.  ACTIVITY:  You should plan to take it easy for the rest  of today and you should NOT DRIVE or use heavy machinery until tomorrow (because of the sedation medicines used during the test).    FOLLOW UP: Our staff will call the number listed on your records 48-72 hours following your procedure to check on you and address any questions or concerns that you may have regarding the information given to you following your procedure. If we do not reach you, we will leave a message.  We will attempt to reach you two times.  During this call, we will ask if you have developed any symptoms of COVID 19. If you develop any symptoms (ie: fever, flu-like symptoms, shortness of breath, cough etc.) before then, please call [phone].  If you test positive for Covid 19 in the 2 weeks post procedure, please call and report this information to [place].    If any biopsies were taken you will be contacted by phone or by letter within the next 1-3 weeks.  Please call us at [phone] if you have not heard about the biopsies in 3 weeks.    SIGNATURES/CONFIDENTIALITY: You and/or your care partner have signed paperwork which will be entered into your electronic medical record.  These signatures attest to the fact that that the information above on your After Visit Summary has been reviewed and is understood.  Full responsibility of the confidentiality of this discharge information lies with you and/or your care-partner.

## 2018-07-25 NOTE — Progress Notes (Signed)
A and O x3. Report to RN. Tolerated MAC anesthesia well.

## 2018-07-25 NOTE — Op Note (Signed)
[place] Patient Name: Scott Hatfield Procedure Date: 07/25/2018 8:04 AM MRN: [id number] Endoscopist: Remo Lipps P. Havery Moros , MD Age: 58 Referring MD:  Date of Birth: 1960/03/26 Gender: Male Account #: [id number] Procedure:                Colonoscopy Indications:              Screening for colorectal malignant neoplasm, This                            is the patient's first colonoscopy Medicines:                Monitored Anesthesia Care Procedure:                Pre-Anesthesia Assessment:                           - Prior to the procedure, a History and Physical                            was performed, and patient medications and                            allergies were reviewed. The patient's tolerance of                            previous anesthesia was also reviewed. The risks                            and benefits of the procedure and the sedation                            options and risks were discussed with the patient.                            All questions were answered, and informed consent                            was obtained. Prior Anticoagulants: The patient has                            taken no previous anticoagulant or antiplatelet                            agents. ASA Grade Assessment: II - A patient with                            mild systemic disease. After reviewing the risks                            and benefits, the patient was deemed in                            satisfactory condition to undergo the procedure.  After obtaining informed consent, the colonoscope                            was passed under direct vision. Throughout the                            procedure, the patient's blood pressure, pulse, and                            oxygen saturations were monitored continuously. The                            Colonoscope was introduced through the anus and                            advanced to the the  cecum, identified by                            appendiceal orifice and ileocecal valve. The                            colonoscopy was performed without difficulty. The                            patient tolerated the procedure well. The quality                            of the bowel preparation was adequate. The                            ileocecal valve, appendiceal orifice, and rectum                            were photographed. Scope In: 8:11:30 AM Scope Out: 8:40:22 AM Scope Withdrawal Time: 0 hours 24 minutes 49 seconds  Total Procedure Duration: 0 hours 28 minutes 52 seconds  Findings:                 The perianal and digital rectal examinations were                            normal.                           Three sessile polyps were found in the cecum. The                            polyps were 3 to 5 mm in size. These polyps were                            removed with a cold snare. Resection and retrieval                            were complete.  A 4 mm polyp was found in the ascending colon. The                            polyp was sessile. The polyp was removed with a                            cold snare. Resection and retrieval were complete.                           Three sessile polyps were found in the transverse                            colon. The polyps were 3 to 7 mm in size. These                            polyps were removed with a cold snare. Resection                            and retrieval were complete.                           Scattered medium-mouthed diverticula were found in                            the entire colon.                           Internal hemorrhoids were found during retroflexion.                           The colon was spastic which prolonged the                            procedure. The exam was otherwise without                            abnormality. Complications:            No immediate  complications. Estimated blood loss:                            Minimal. Estimated Blood Loss:     Estimated blood loss was minimal. Impression:               - Three 3 to 5 mm polyps in the cecum, removed with                            a cold snare. Resected and retrieved.                           - One 4 mm polyp in the ascending colon, removed                            with a cold snare. Resected and retrieved.                           -  Three 3 to 7 mm polyps in the transverse colon,                            removed with a cold snare. Resected and retrieved.                           - Diverticulosis in the entire examined colon.                           - Internal hemorrhoids.                           - Spastic colon                           - The examination was otherwise normal. Recommendation:           - Patient has a contact number available for                            emergencies. The signs and symptoms of potential                            delayed complications were discussed with the                            patient. Return to normal activities tomorrow.                            Written discharge instructions were provided to the                            patient.                           - Resume previous diet.                           - Continue present medications.                           - Await pathology results. Remo Lipps P. Scott Blumberg, MD 07/25/2018 8:44:44 AM This report has been signed electronically.

## 2018-07-29 ENCOUNTER — Telehealth: Payer: Self-pay

## 2018-07-29 NOTE — Telephone Encounter (Signed)
  Follow up Call-  Call back number 07/25/2018  Post procedure Call Back phone  # [phone]  Permission to leave phone message Yes  Some recent data might be hidden     Patient questions:  Do you have a fever, pain , or abdominal swelling? No. Pain Score  0 *  Have you tolerated food without any problems? Yes.    Have you been able to return to your normal activities? Yes.    Do you have any questions about your discharge instructions: Diet   No. Medications  No. Follow up visit  No.  Do you have questions or concerns about your Care? No.  Actions: * If pain score is 4 or above: No action needed, pain <4.

## 2018-08-13 ENCOUNTER — Other Ambulatory Visit: Payer: Self-pay

## 2018-08-13 ENCOUNTER — Other Ambulatory Visit: Payer: Self-pay | Admitting: Physician Assistant

## 2018-08-13 DIAGNOSIS — I10 Essential (primary) hypertension: Secondary | ICD-10-CM

## 2018-08-13 DIAGNOSIS — I2510 Atherosclerotic heart disease of native coronary artery without angina pectoris: Secondary | ICD-10-CM

## 2018-08-14 ENCOUNTER — Encounter: Payer: Self-pay | Admitting: Family Medicine

## 2018-08-14 ENCOUNTER — Ambulatory Visit: Payer: Commercial Managed Care - PPO | Admitting: Family Medicine

## 2018-08-14 VITALS — BP 149/88 | HR 82 | Temp 98.3°F | Ht 73.0 in | Wt 293.0 lb

## 2018-08-14 DIAGNOSIS — M7989 Other specified soft tissue disorders: Secondary | ICD-10-CM

## 2018-08-14 DIAGNOSIS — I2510 Atherosclerotic heart disease of native coronary artery without angina pectoris: Secondary | ICD-10-CM | POA: Diagnosis not present

## 2018-08-14 DIAGNOSIS — K5901 Slow transit constipation: Secondary | ICD-10-CM | POA: Diagnosis not present

## 2018-08-14 DIAGNOSIS — R2231 Localized swelling, mass and lump, right upper limb: Secondary | ICD-10-CM | POA: Diagnosis not present

## 2018-08-14 DIAGNOSIS — I10 Essential (primary) hypertension: Secondary | ICD-10-CM | POA: Diagnosis not present

## 2018-08-14 DIAGNOSIS — Z125 Encounter for screening for malignant neoplasm of prostate: Secondary | ICD-10-CM

## 2018-08-14 MED ORDER — AMLODIPINE BESYLATE 10 MG PO TABS: 10.0000 mg | ORAL_TABLET | Freq: Every day | ORAL | 3 refills | Status: DC

## 2018-08-14 MED ORDER — ATORVASTATIN CALCIUM 40 MG PO TABS: ORAL_TABLET | ORAL | 3 refills | Status: DC

## 2018-08-14 MED ORDER — HYDROCHLOROTHIAZIDE 25 MG PO TABS: 25.0000 mg | ORAL_TABLET | Freq: Every day | ORAL | 3 refills | Status: DC

## 2018-08-14 MED ORDER — LISINOPRIL 40 MG PO TABS: 40.0000 mg | ORAL_TABLET | Freq: Every day | ORAL | 3 refills | Status: DC

## 2018-08-14 MED ORDER — LINACLOTIDE 290 MCG PO CAPS: 290.0000 ug | ORAL_CAPSULE | Freq: Every day | ORAL | 0 refills | Status: DC

## 2018-08-14 MED ORDER — METOPROLOL TARTRATE 50 MG PO TABS: 50.0000 mg | ORAL_TABLET | Freq: Two times a day (BID) | ORAL | 3 refills | Status: DC

## 2018-08-14 NOTE — Patient Instructions (Signed)
Come in for fasting labs at some point.  We'll check cholesterol, liver function, etc.  I ordered an ultrasound of the fatty mass on your arm.  Referral to the surgeon placed as well.  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  I gave you samples of a medication to help with your bowels.  Below is a back up regimen.  Thank you for coming in to clinic today.  1. Your symptoms are consistent with Constipation, likely cause of your General Abdominal Pain / Cramping. 2. Start with Miralax sent to pharmacy. First dose 68g (4 capfuls) in 32oz water over 1 to 2 hours for clean out. Next day start 17g or 1 capful daily, may adjust dose up or down by half a capful every few days. Recommend to take this medicine daily for next 1-2 weeks, then may need to use it longer if needed. - Goal is to have soft regular bowel movement 1-3x daily, if too runny or diarrhea, then reduce dose of the medicine  Improve water intake, hydration will help Also recommend increased vegetables, fruits, fiber intake Can try daily Metamucil or Fiber supplement at pharmacy over the counter  Follow-up if symptoms are not improving with bowel movements, or if pain worsens, develop fevers, nausea, vomiting.  If you have any other questions or concerns, please feel free to call the clinic to contact me. You may also schedule an earlier appointment if necessary.  However, if your symptoms get significantly worse, please go to the Emergency Department to seek immediate medical attention.  Lipoma  A lipoma is a noncancerous (benign) tumor that is made up of fat cells. This is a very common type of soft-tissue growth. Lipomas are usually found under the skin (subcutaneous). They may occur in any tissue of the body that contains fat. Common areas for lipomas to appear include the back, shoulders, buttocks, and thighs.  Lipomas grow slowly, and they are usually painless. Most lipomas do not cause problems and do not require treatment. What are  the causes? The cause of this condition is not known. What increases the risk? You are more likely to develop this condition if:  You are 58-63 years old.  You have a family history of lipomas. What are the signs or symptoms? A lipoma usually appears as a small, round bump under the skin. In most cases, the lump will:  Feel soft or rubbery.  Not cause pain or other symptoms. However, if a lipoma is located in an area where it pushes on nerves, it can become painful or cause other symptoms. How is this diagnosed? A lipoma can usually be diagnosed with a physical exam. You may also have tests to confirm the diagnosis and to rule out other conditions. Tests may include:  Imaging tests, such as a CT scan or MRI.  Removal of a tissue sample to be looked at under a microscope (biopsy). How is this treated? Treatment for this condition depends on the size of the lipoma and whether it is causing any symptoms.  For small lipomas that are not causing problems, no treatment is needed.  If a lipoma is bigger or it causes problems, surgery may be done to remove the lipoma. Lipomas can also be removed to improve appearance. Most often, the procedure is done after applying a medicine that numbs the area (local anesthetic). Follow these instructions at home:  Watch your lipoma for any changes.  Keep all follow-up visits as told by your health care provider. This  is important. Contact a health care provider if:  Your lipoma becomes larger or hard.  Your lipoma becomes painful, red, or increasingly swollen. These could be signs of infection or a more serious condition. Get help right away if:  You develop tingling or numbness in an area near the lipoma. This could indicate that the lipoma is causing nerve damage. Summary  A lipoma is a noncancerous tumor that is made up of fat cells.  Most lipomas do not cause problems and do not require treatment.  If a lipoma is bigger or it causes  problems, surgery may be done to remove the lipoma. This information is not intended to replace advice given to you by your health care provider. Make sure you discuss any questions you have with your health care provider. Document Released: 01/20/2002 Document Revised: 01/16/2017 Document Reviewed: 01/16/2017 Elsevier Patient Education  [place].

## 2018-08-14 NOTE — Progress Notes (Signed)
Subjective: CC: f/u HTN PCP: Janora Norlander, DO YOV:Scott Hatfield is a 58 y.o. male presenting to clinic today for:  1. Hypertension/ tobacco use He reports compliance with amlodipine 10 mg daily, hydrochlorothiazide 25 mg daily, lisinopril 40 mg daily and Lopressor 50 mg twice daily.  No chest pain, shortness of breath, lower extremity edema.  He is working on smoking cessation and is down to 1/4 pack/day.  This is from 1/2 pack/day.  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  No adverse side effects.  2.  Constipation Patient reports he is had issues with constipation since he had his colonoscopy 2 weeks ago.  He notes that he used to have at least 1 bowel movement daily but now he is not going except for every 3 days.  Stools are hard and pellet-like.  He has had some intermittent abdominal pain as a result.  No hematochezia or melena.  No nausea or vomiting.  He tried using MiraLAX but this did not help.  3.  Lump on shoulder Patient reports he is noticed a soft tissue lump on the right anterior upper extremity/shoulder that has been present for at least a month.  Since last weekend he started having a little bit of tenderness associated with the lump.  Denies any oozing, weakness, sensation changes.   ROS: Per HPI  No Known Allergies Past Medical History:  Diagnosis Date  . Allergy    occasionally   . CAD in native artery    a. 2008 - Single-vessel CAD with stent placement to the circumflex  b. 2012 - DES to Diag.  c. 06/2014 - RCA 25% stenosis, LAD 30% stenosis, ramus intermediate 45% stenosis, D1 95% stenosis treated with DES  d.01/2015 - nonobstructive CAD  . GERD (gastroesophageal reflux disease)   . Hyperlipidemia, mixed   . Hypertension   . Myocardial infarction ([place]) 2008  . Overweight(278.02)   . Sleep apnea    CPAP    Current Outpatient Medications:  .  amLODipine (NORVASC) 10 MG tablet, Take 1 tablet (10 mg total) by mouth  daily., Disp: 90 tablet, Rfl: 3 .  aspirin 81 MG tablet, Take 81 mg by mouth daily.  , Disp: , Rfl:  .  atorvastatin (LIPITOR) 40 MG tablet, TAKE 1 TABLET BY MOUTH AT 6 IN THE EVENING, Disp: 90 tablet, Rfl: 3 .  hydrochlorothiazide (HYDRODIURIL) 25 MG tablet, Take 1 tablet (25 mg total) by mouth daily., Disp: 90 tablet, Rfl: 3 .  lisinopril (ZESTRIL) 40 MG tablet, Take 1 tablet (40 mg total) by mouth daily., Disp: 90 tablet, Rfl: 3 .  metoprolol tartrate (LOPRESSOR) 50 MG tablet, Take 1 tablet (50 mg total) by mouth 2 (two) times daily., Disp: 180 tablet, Rfl: 3 .  nitroGLYCERIN (NITROSTAT) 0.4 MG SL tablet, Place 1 tablet (0.4 mg total) under the tongue every 5 (five) minutes as needed. May repeat for up to 3 doses., Disp: 25 tablet, Rfl: prn .  varenicline (CHANTIX CONTINUING MONTH PAK) 1 MG tablet, Take 1 tablet (1 mg total) by mouth 2 (two) times daily., Disp: 60 tablet, Rfl: 2 Social History   Socioeconomic History  . Marital status: Married    Spouse name: Not on file  . Number of children: Not on file  . Years of education: Not on file  . Highest education level: Not on file  Occupational History  . Occupation: Full time    Employer: [employer]  Social Needs  .  Financial resource strain: Not on file  . Food insecurity    Worry: Not on file    Inability: Not on file  . Transportation needs    Medical: Not on file    Non-medical: Not on file  Tobacco Use  . Smoking status: Former Smoker    Start date: 03/20/2012    Quit date: 01/30/2017    Years since quitting: 1.5  . Smokeless tobacco: Never Used  Substance and Sexual Activity  . Alcohol use: Yes    Alcohol/week: 0.0 standard drinks    Comment: occ  . Drug use: No  . Sexual activity: Not on file  Lifestyle  . Physical activity    Days per week: Not on file    Minutes per session: Not on file  . Stress: Not on file  Relationships  . Social Herbalist on phone: Not on file    Gets together: Not on  file    Attends religious service: Not on file    Active member of club or organization: Not on file    Attends meetings of clubs or organizations: Not on file    Relationship status: Not on file  . Intimate partner violence    Fear of current or ex partner: Not on file    Emotionally abused: Not on file    Physically abused: Not on file    Forced sexual activity: Not on file  Other Topics Concern  . Not on file  Social History Narrative   Married   No regular exercise   Family History  Problem Relation Age of Onset  . Heart disease Mother   . Colon polyps Father   . Coronary artery disease Other   . Colon polyps Sister   . Colon cancer Neg Hx   . Esophageal cancer Neg Hx   . Rectal cancer Neg Hx   . Stomach cancer Neg Hx     Objective: Office vital signs reviewed. BP (!) 149/88   Pulse 82   Temp 98.3 F (36.8 C) (Oral)   Ht '6\' 1"'  (1.854 m)   Wt 293 lb (132.9 kg)   BMI 38.66 kg/m   Physical Examination:  General: Awake, alert, well nourished, No acute distress HEENT: Normal, sclera white, MMM Cardio: regular rate and rhythm, S1S2 heard, no murmurs appreciated Pulm: clear to auscultation bilaterally, no wheezes, rhonchi or rales; normal work of breathing on room air GI: soft, mildly tender throughout. No peritoneal signs. Non-distended, bowel sounds present but hypoactive x4, no hepatomegaly, no splenomegaly, no masses Extremities: warm, well perfused, No edema, cyanosis or clubbing; +2 pulses bilaterally MSK: normal gait and station Skin: dry; intact; no rashes.  He has a ~1.25"x1.5" soft tissue mass that feels fairly mobile along the right anterior shoulder/upper extremity.  There is no associated erythema.  No induration.  No visible punctum.  Assessment/ Plan: 58 y.o. male   1. Essential hypertension Slightly uncontrolled but I think patient has a little bit of anxiety related to today's appointment.  He is uncomfortable from constipation.  For now no changes  in medication.  He is to continue monitoring blood pressure at home and reduce smoking as he has been.  If persistently elevated may need to consider advancing his medication versus augmenting medicine. - lisinopril (ZESTRIL) 40 MG tablet; Take 1 tablet (40 mg total) by mouth daily.  Dispense: 90 tablet; Refill: 3 - amLODipine (NORVASC) 10 MG tablet; Take 1 tablet (10 mg total) by mouth daily.  Dispense: 90 tablet; Refill: 3 - hydrochlorothiazide (HYDRODIURIL) 25 MG tablet; Take 1 tablet (25 mg total) by mouth daily.  Dispense: 90 tablet; Refill: 3 - metoprolol tartrate (LOPRESSOR) 50 MG tablet; Take 1 tablet (50 mg total) by mouth 2 (two) times daily.  Dispense: 180 tablet; Refill: 3 - CMP14+EGFR; Future  2. Coronary artery disease involving native coronary artery of native heart without angina pectoris As above - atorvastatin (LIPITOR) 40 MG tablet; TAKE 1 TABLET BY MOUTH AT 6 IN THE EVENING  Dispense: 90 tablet; Refill: 3 - CMP14+EGFR; Future - Lipid Panel; Future  3. Slow transit constipation I have given him a sample of Linzess.  Have also given him instructions on MiraLAX cleanout.  We discussed red flag signs or symptoms.  He will contact me if concerns arise.  4. Mass of soft tissue of right upper extremity Likely lipoma.  I will order an ultrasound to further evaluate.  We discussed that if ultrasound is inconclusive may need to consider MRI.  I have placed a referral to general surgery as well for consideration of excision of the lesion. - Ambulatory referral to General Surgery - US SOFT TISSUE RT UPPER EXTREMITY LTD (NON-VASCULAR); Future - CBC; Future  5. Screening for malignant neoplasm of prostate Patient to come in for future fasting labs. - PSA; Future   No orders of the defined types were placed in this encounter.  Meds ordered this encounter  Medications  . atorvastatin (LIPITOR) 40 MG tablet    Sig: TAKE 1 TABLET BY MOUTH AT 6 IN THE EVENING    Dispense:  90 tablet     Refill:  3  . lisinopril (ZESTRIL) 40 MG tablet    Sig: Take 1 tablet (40 mg total) by mouth daily.    Dispense:  90 tablet    Refill:  3  . amLODipine (NORVASC) 10 MG tablet    Sig: Take 1 tablet (10 mg total) by mouth daily.    Dispense:  90 tablet    Refill:  3  . hydrochlorothiazide (HYDRODIURIL) 25 MG tablet    Sig: Take 1 tablet (25 mg total) by mouth daily.    Dispense:  90 tablet    Refill:  3  . metoprolol tartrate (LOPRESSOR) 50 MG tablet    Sig: Take 1 tablet (50 mg total) by mouth 2 (two) times daily.    Dispense:  180 tablet    Refill:  3    Please consider 90 day supplies to promote better adherence     Janora Norlander, DO [place] [phone]

## 2018-08-15 ENCOUNTER — Ambulatory Visit (HOSPITAL_COMMUNITY)
Admission: RE | Admit: 2018-08-15 | Discharge: 2018-08-15 | Disposition: A | Discharge status: Home / Self Care | Payer: Commercial Managed Care - PPO | Source: Ambulatory Visit | Attending: Family Medicine | Admitting: Family Medicine

## 2018-08-15 ENCOUNTER — Other Ambulatory Visit: Payer: Self-pay

## 2018-08-15 DIAGNOSIS — R2231 Localized swelling, mass and lump, right upper limb: Secondary | ICD-10-CM | POA: Diagnosis not present

## 2018-08-15 DIAGNOSIS — M7989 Other specified soft tissue disorders: Secondary | ICD-10-CM

## 2018-09-05 ENCOUNTER — Encounter: Payer: Self-pay | Admitting: General Surgery

## 2018-09-05 ENCOUNTER — Other Ambulatory Visit: Payer: Self-pay

## 2018-09-05 ENCOUNTER — Ambulatory Visit (INDEPENDENT_AMBULATORY_CARE_PROVIDER_SITE_OTHER): Payer: Commercial Managed Care - PPO | Admitting: General Surgery

## 2018-09-05 VITALS — BP 147/88 | HR 84 | Temp 99.3°F | Resp 16 | Ht 72.0 in | Wt 295.0 lb

## 2018-09-05 DIAGNOSIS — K5900 Constipation, unspecified: Secondary | ICD-10-CM | POA: Diagnosis not present

## 2018-09-05 DIAGNOSIS — L723 Sebaceous cyst: Secondary | ICD-10-CM | POA: Diagnosis not present

## 2018-09-05 DIAGNOSIS — D1721 Benign lipomatous neoplasm of skin and subcutaneous tissue of right arm: Secondary | ICD-10-CM | POA: Diagnosis not present

## 2018-09-05 HISTORY — DX: Sebaceous cyst: L72.3

## 2018-09-05 HISTORY — DX: Benign lipomatous neoplasm of skin and subcutaneous tissue of right arm: D17.21

## 2018-09-05 MED ORDER — DOCUSATE SODIUM 100 MG PO CAPS: 100.0000 mg | ORAL_CAPSULE | Freq: Two times a day (BID) | ORAL | 2 refills | Status: DC

## 2018-09-05 NOTE — Progress Notes (Signed)
[facility] History and Physical  Reason for Referral: Right arm lipoma  Referring Physician:  Dr. Lajuana Ripple MD   Chief Complaint    Pre-op Exam      Scott Hatfield is a 58 y.o. male.  HPI: Scott Hatfield is a very pleasant 58 yo who comes today with complaints of a right sided arm mass that has been fairly stable and has been there for years. He has never thought anything of this area and has been told it was a lipoma but he did have some unexpected pain in the area, and this brought his attention to it. He says that he saw his PCP, and she referred him to get it evaluated. He says that the pain and tenderness have now improved, but that he wanted to discuss having it removed. He also reports and wants to discuss some other issues including his new onset constipation since his colonoscopy a few weeks prior. He says that he has had hard stools and that these have been every 2-3 days .  He has been having some abdominal pain when he feels constipated. He had tried some miralax, and Dr. Lajuana Ripple gave him a prescription to try Linzess.  He reports that prior to the colonoscopy he had no issues with constipation and had regular Bms. The colonoscopy report demonstrated some diverticula and polyps and a spastic colon.  The patient also wanted to discuss an area in the right inner thigh where he says he has had multiple abscesses in the past. He says that this area has drained and become swollen in the past and this occurred a few weeks ago. He is able to get stuff to drain out of the area, and says that the hardness is now larger than it had been in the past. He says he has had this area for years and that he has had multiple episodes of it becoming infected/ inflamed.   Past Medical History:  Diagnosis Date  . Allergy    occasionally   . CAD in native artery    a. 2008 - Single-vessel CAD with stent placement to the circumflex  b. 2012 - DES to Diag.  c. 06/2014 - RCA 25% stenosis,  LAD 30% stenosis, ramus intermediate 45% stenosis, D1 95% stenosis treated with DES  d.01/2015 - nonobstructive CAD  . GERD (gastroesophageal reflux disease)   . Hyperlipidemia, mixed   . Hypertension   . Myocardial infarction ([place]) 2008  . Overweight(278.02)   . Sleep apnea    CPAP    Past Surgical History:  Procedure Laterality Date  . CARDIAC CATHETERIZATION N/A 07/03/2014   Procedure: Left Heart Cath and Coronary Angiography;  Surgeon: Peter M Martinique, MD;  Location: [facility];  Service: Cardiovascular;  Laterality: N/A;  . CARDIAC CATHETERIZATION N/A 07/03/2014   Procedure: Coronary Stent Intervention;  Surgeon: Peter M Martinique, MD;  Location: [facility];  Service: Cardiovascular;  Laterality: N/A;  . CARDIAC CATHETERIZATION N/A 01/26/2015   Procedure: Left Heart Cath and Coronary Angiography;  Surgeon: Peter M Martinique, MD;  Location: [facility];  Service: Cardiovascular;  Laterality: N/A;  . CORONARY STENT PLACEMENT  2008, 2012  . wrist  surgery      Family History  Problem Relation Age of Onset  . Heart disease Mother   . Colon polyps Father   . Coronary artery disease Other   . Colon polyps Sister   . Colon cancer Neg Hx   . Esophageal  cancer Neg Hx   . Rectal cancer Neg Hx   . Stomach cancer Neg Hx     Social History   Tobacco Use  . Smoking status: Former Smoker    Start date: 03/20/2012    Quit date: 01/30/2017    Years since quitting: 1.5  . Smokeless tobacco: Never Used  Substance Use Topics  . Alcohol use: Yes    Alcohol/week: 0.0 standard drinks    Comment: occ  . Drug use: No    Medications: I have reviewed the patient's current medications. Allergies as of 09/05/2018   No Known Allergies     Medication List       Accurate as of September 05, 2018  2:40 PM. If you have any questions, ask your nurse or doctor.        amLODipine 10 MG tablet Commonly known as: NORVASC Take 1 tablet (10 mg total) by mouth daily.   aspirin 81 MG  tablet Take 81 mg by mouth daily.   atorvastatin 40 MG tablet Commonly known as: LIPITOR TAKE 1 TABLET BY MOUTH AT 6 IN THE EVENING   hydrochlorothiazide 25 MG tablet Commonly known as: HYDRODIURIL Take 1 tablet (25 mg total) by mouth daily.   linaclotide 290 MCG Caps capsule Commonly known as: Linzess Take 1 capsule (290 mcg total) by mouth daily before breakfast.   lisinopril 40 MG tablet Commonly known as: ZESTRIL Take 1 tablet (40 mg total) by mouth daily.   metoprolol tartrate 50 MG tablet Commonly known as: LOPRESSOR Take 1 tablet (50 mg total) by mouth 2 (two) times daily.   nitroGLYCERIN 0.4 MG SL tablet Commonly known as: NITROSTAT Place 1 tablet (0.4 mg total) under the tongue every 5 (five) minutes as needed. May repeat for up to 3 doses.   varenicline 1 MG tablet Commonly known as: Chantix Continuing Month Pak Take 1 tablet (1 mg total) by mouth 2 (two) times daily.        ROS:  A comprehensive review of systems was negative except for: Musculoskeletal: positive for right arm lipoma sebaceous cyst right inner thigh/ drains periodically  Blood pressure (!) 147/88, pulse 84, temperature 99.3 F (37.4 C), temperature source Temporal, resp. rate 16, height 6' (1.829 m), weight 295 lb (133.8 kg), SpO2 92 %. Physical Exam Vitals signs reviewed.  Constitutional:      Appearance: Normal appearance.  HENT:     Head: Normocephalic.     Nose: Nose normal.     Mouth/Throat:     Mouth: Mucous membranes are moist.  Eyes:     Extraocular Movements: Extraocular movements intact.     Pupils: Pupils are equal, round, and reactive to light.  Neck:     Musculoskeletal: Normal range of motion.  Cardiovascular:     Rate and Rhythm: Normal rate and regular rhythm.  Pulmonary:     Effort: Pulmonary effort is normal.     Breath sounds: Normal breath sounds.  Abdominal:     General: There is no distension.     Palpations: Abdomen is soft.     Tenderness: There is no  abdominal tenderness.  Musculoskeletal: Normal range of motion.        General: No swelling.     Comments: Right arm with a 4cm lipoma, feels superficial in nature, mobile, right inner thigh with induration, about 2cm, no active erythema or drainage  Skin:    General: Skin is warm.  Neurological:     General: No focal deficit  present.     Mental Status: He is alert and oriented to person, place, and time.  Psychiatric:        Mood and Affect: Mood normal.        Behavior: Behavior normal.        Thought Content: Thought content normal.        Judgment: Judgment normal.     Results: Right Upper Extremity US- CLINICAL DATA:  Right anterior upper arm mass for 1 month  EXAM: ULTRASOUND RIGHT UPPER EXTREMITY LIMITED  TECHNIQUE: Ultrasound examination of the upper extremity soft tissues was performed in the area of clinical concern.  COMPARISON:  None  FINDINGS: 3 x 0.9 x 3.9 cm well circumscribed echogenic mass in the subcutaneous fat without internal doppler flow.  No other fluid collection, hematoma or soft tissue mass.  IMPRESSION: 3 x 0.9 x 3.9 cm well circumscribed echogenic mass most consistent with a lipoma.  Assessment & Plan:  Scott Hatfield is a 58 y.o. male with multiple issues he wanted to discuss today including the right arm lipoma, the right inner thigh sebaceous cyst that has been infected in the past, and his new issue with constipation.   -Lipoma can be excised in the future as he desires. The [place] is consistent with lipoma. We discussed the risk of bleeding, infection, risk of it recurring, and risk of it being under some muscle.  -Sebaceous cyst can also be excised. We discussed the risk of infection, bleeding, and recurrence with cyst.   -Constipation recommendations included:  Drink 64+ ounces of water a day 4-5; 16 ounce bottles at least.  Start on a fiber supplement daily (Benefiber or Metamucil over the counter). Some people like one better  than the other and some people say one works better than the other for them.  Take colace 100mg  twice a day to soften the stools.  Take miralax as needed if you do not have a BM every 2 days.  Take your Linzess if this does not work. Consider being referred back to GI to discuss constipation and medication options like Linzess if it continues.   We discussed that with COVID you have to be tested prior to procedures. We discussed that neither of these issues are urgent, and he can call us once he wants to have these areas excised.   All questions were answered to the satisfaction of the patient.  I spent over 45 minutes with this patient discussing the three separate issues he asked about today and giving recommendations.      Virl Cagey 09/05/2018, 2:40 PM

## 2018-09-05 NOTE — Patient Instructions (Addendum)
Drink 64+ ounces of water a day 4-5; 16 ounce bottles at least.  Start on a fiber supplement daily (Benefiber or Metamucil over the counter). Some people like one better than the other and some people say one works better than the other for them.  Take colace 100mg  twice a day to soften the stools.  Take miralax as needed if you do not have a BM every 2 days.  Take your Linzess if this does not work. Consider being referred back to GI to discuss constipation and medication options like Linzess if it continues.    Constipation, Adult Constipation is when a person has fewer bowel movements in a week than normal, has difficulty having a bowel movement, or has stools that are dry, hard, or larger than normal. Constipation may be caused by an underlying condition. It may become worse with age if a person takes certain medicines and does not take in enough fluids. Follow these instructions at home: Eating and drinking   Eat foods that have a lot of fiber, such as fresh fruits and vegetables, whole grains, and beans.  Limit foods that are high in fat, low in fiber, or overly processed, such as french fries, hamburgers, cookies, candies, and soda.  Drink enough fluid to keep your urine clear or pale yellow. General instructions  Exercise regularly or as told by your health care provider.  Go to the restroom when you have the urge to go. Do not hold it in.  Take over-the-counter and prescription medicines only as told by your health care provider. These include any fiber supplements.  Practice pelvic floor retraining exercises, such as deep breathing while relaxing the lower abdomen and pelvic floor relaxation during bowel movements.  Watch your condition for any changes.  Keep all follow-up visits as told by your health care provider. This is important. Contact a health care provider if:  You have pain that gets worse.  You have a fever.  You do not have a bowel movement after 4 days.   You vomit.  You are not hungry.  You lose weight.  You are bleeding from the anus.  You have thin, pencil-like stools. Get help right away if:  You have a fever and your symptoms suddenly get worse.  You leak stool or have blood in your stool.  Your abdomen is bloated.  You have severe pain in your abdomen.  You feel dizzy or you faint. This information is not intended to replace advice given to you by your health care provider. Make sure you discuss any questions you have with your health care provider. Document Released: 10/29/2003 Document Revised: 01/12/2017 Document Reviewed: 07/21/2015 Elsevier Patient Education  2020 [place].   Lipoma  A lipoma is a noncancerous (benign) tumor that is made up of fat cells. This is a very common type of soft-tissue growth. Lipomas are usually found under the skin (subcutaneous). They may occur in any tissue of the body that contains fat. Common areas for lipomas to appear include the back, shoulders, buttocks, and thighs.  Lipomas grow slowly, and they are usually painless. Most lipomas do not cause problems and do not require treatment. What are the causes? The cause of this condition is not known. What increases the risk? You are more likely to develop this condition if:  You are 58-49 years old.  You have a family history of lipomas. What are the signs or symptoms? A lipoma usually appears as a small, round bump under the skin.  In most cases, the lump will:  Feel soft or rubbery.  Not cause pain or other symptoms. However, if a lipoma is located in an area where it pushes on nerves, it can become painful or cause other symptoms. How is this diagnosed? A lipoma can usually be diagnosed with a physical exam. You may also have tests to confirm the diagnosis and to rule out other conditions. Tests may include:  Imaging tests, such as a CT scan or MRI.  Removal of a tissue sample to be looked at under a microscope  (biopsy). How is this treated? Treatment for this condition depends on the size of the lipoma and whether it is causing any symptoms.  For small lipomas that are not causing problems, no treatment is needed.  If a lipoma is bigger or it causes problems, surgery may be done to remove the lipoma. Lipomas can also be removed to improve appearance. Most often, the procedure is done after applying a medicine that numbs the area (local anesthetic). Follow these instructions at home:  Watch your lipoma for any changes.  Keep all follow-up visits as told by your health care provider. This is important. Contact a health care provider if:  Your lipoma becomes larger or hard.  Your lipoma becomes painful, red, or increasingly swollen. These could be signs of infection or a more serious condition. Get help right away if:  You develop tingling or numbness in an area near the lipoma. This could indicate that the lipoma is causing nerve damage. Summary  A lipoma is a noncancerous tumor that is made up of fat cells.  Most lipomas do not cause problems and do not require treatment.  If a lipoma is bigger or it causes problems, surgery may be done to remove the lipoma. This information is not intended to replace advice given to you by your health care provider. Make sure you discuss any questions you have with your health care provider. Document Released: 01/20/2002 Document Revised: 01/16/2017 Document Reviewed: 01/16/2017 Elsevier Patient Education  [place].   Epidermal Cyst (Sebaceous Cyst)  An epidermal cyst is a sac made of skin tissue. The sac contains a substance called keratin. Keratin is a protein that is normally secreted through the hair follicles. When keratin becomes trapped in the top layer of skin (epidermis), it can form an epidermal cyst. Epidermal cysts can be found anywhere on your body. These cysts are usually harmless (benign), and they may not cause symptoms unless  they become infected. What are the causes? This condition may be caused by:  A blocked hair follicle.  A hair that curls and re-enters the skin instead of growing straight out of the skin (ingrown hair).  A blocked pore.  Irritated skin.  An injury to the skin.  Certain conditions that are passed along from parent to child (inherited).  Human papillomavirus (HPV).  Long-term (chronic) sun damage to the skin. What increases the risk? The following factors may make you more likely to develop an epidermal cyst:  Having acne.  Being overweight.  Being 36-26 years old. What are the signs or symptoms? The only symptom of this condition may be a small, painless lump underneath the skin. When an epidermal cyst ruptures, it may become infected. Symptoms may include:  Redness.  Inflammation.  Tenderness.  Warmth.  Fever.  Keratin draining from the cyst. Keratin is grayish-white, bad-smelling substance.  Pus draining from the cyst. How is this diagnosed? This condition is diagnosed with a physical  exam.  In some cases, you may have a sample of tissue (biopsy) taken from your cyst to be examined under a microscope or tested for bacteria.  You may be referred to a health care provider who specializes in skin care (dermatologist). How is this treated? In many cases, epidermal cysts go away on their own without treatment. If a cyst becomes infected, treatment may include:  Opening and draining the cyst, done by a health care provider. After draining, minor surgery to remove the rest of the cyst may be done.  Antibiotic medicine.  Injections of medicines (steroids) that help to reduce inflammation.  Surgery to remove the cyst. Surgery may be done if the cyst: ? Becomes large. ? Bothers you. ? Has a chance of turning into cancer.  Do not try to open a cyst yourself. Follow these instructions at home:  Take over-the-counter and prescription medicines only as told by  your health care provider.  If you were prescribed an antibiotic medicine, take it it as told by your health care provider. Do not stop using the antibiotic even if you start to feel better.  Keep the area around your cyst clean and dry.  Wear loose, dry clothing.  Avoid touching your cyst.  Check your cyst every day for signs of infection. Check for: ? Redness, swelling, or pain. ? Fluid or blood. ? Warmth. ? Pus or a bad smell.  Keep all follow-up visits as told by your health care provider. This is important. How is this prevented?  Wear clean, dry, clothing.  Avoid wearing tight clothing.  Keep your skin clean and dry. Take showers or baths every day. Contact a health care provider if:  Your cyst develops symptoms of infection.  Your condition is not improving or is getting worse.  You develop a cyst that looks different from other cysts you have had.  You have a fever. Get help right away if:  Redness spreads from the cyst into the surrounding area. Summary  An epidermal cyst is a sac made of skin tissue. These cysts are usually harmless (benign), and they may not cause symptoms unless they become infected.  If a cyst becomes infected, treatment may include surgery to open and drain the cyst, or to remove it. Treatment may also include medicines by mouth or through an injection.  Take over-the-counter and prescription medicines only as told by your health care provider. If you were prescribed an antibiotic medicine, take it as told by your health care provider. Do not stop using the antibiotic even if you start to feel better.  Contact a health care provider if your condition is not improving or is getting worse.  Keep all follow-up visits as told by your health care provider. This is important. This information is not intended to replace advice given to you by your health care provider. Make sure you discuss any questions you have with your health care provider.  Document Released: 01/01/2004 Document Revised: 05/23/2018 Document Reviewed: 08/13/2017 Elsevier Patient Education  2020 [employer].

## 2018-10-02 ENCOUNTER — Other Ambulatory Visit: Payer: Self-pay | Admitting: *Deleted

## 2018-10-02 DIAGNOSIS — F17200 Nicotine dependence, unspecified, uncomplicated: Secondary | ICD-10-CM

## 2018-10-02 MED ORDER — VARENICLINE TARTRATE 1 MG PO TABS: 1.0000 mg | ORAL_TABLET | Freq: Two times a day (BID) | ORAL | 2 refills | Status: DC

## 2019-01-21 ENCOUNTER — Telehealth: Payer: Self-pay | Admitting: Family Medicine

## 2019-01-21 NOTE — Telephone Encounter (Signed)
Pt tested positive and wanted to know if he should be on ZPAK. Advised pt for viral infections including COVID you treat symptoms with tylenol, otc cough suppressants, rest and stay hydrated. Pt voiced understanding.

## 2019-03-26 ENCOUNTER — Ambulatory Visit: Payer: Commercial Managed Care - PPO | Admitting: Adult Health

## 2019-04-29 ENCOUNTER — Ambulatory Visit (INDEPENDENT_AMBULATORY_CARE_PROVIDER_SITE_OTHER): Payer: Commercial Managed Care - PPO | Admitting: Family Medicine

## 2019-04-29 ENCOUNTER — Encounter: Payer: Self-pay | Admitting: Family Medicine

## 2019-04-29 DIAGNOSIS — J0110 Acute frontal sinusitis, unspecified: Secondary | ICD-10-CM | POA: Diagnosis not present

## 2019-04-29 MED ORDER — AMOXICILLIN-POT CLAVULANATE 875-125 MG PO TABS: 1.0000 | ORAL_TABLET | Freq: Two times a day (BID) | ORAL | 0 refills | Status: DC

## 2019-04-29 MED ORDER — GUAIFENESIN-CODEINE 100-10 MG/5ML PO SOLN: 5.0000 mL | Freq: Four times a day (QID) | ORAL | 0 refills | Status: DC | PRN

## 2019-04-29 NOTE — Progress Notes (Signed)
Telephone visit  Subjective: CC: sinusitis PCP: Janora Norlander, DO [id number] Scott Hatfield is a 59 y.o. male calls for telephone consult today. Patient provides verbal consent for consult held via phone.  Due to COVID-19 pandemic this visit was conducted virtually. This visit type was conducted due to national recommendations for restrictions regarding the COVID-19 Pandemic (e.g. social distancing, sheltering in place) in an effort to limit this patient's exposure and mitigate transmission in our community. All issues noted in this document were discussed and addressed.  A physical exam was not performed with this format.   Location of patient: work Location of provider: Working remotely from home Others present for call: none  1. URI Patient reports that he started feeling bad about 2 weeks ago.  He went to [facility] on Monday and they tested him for COVID, which was negative.  He was placed on prednisone and tessalon perles.  No improvement in symptoms.  He reports symptoms are chest congestion, nasal congestion, fatigue, productive cough (clear), wheezing (not improved by prednisone).  He reports headaches (sinus).  No myalgia, arthralgia.  No nausea, vomiting, diarrhea.  Symptoms have been refractory to Mucinex, advil sinus/ cold.  He has a nasal spray at home that is left over from when he had covid.  He forgot he had it and has not yet used it.  ROS: Per HPI  No Known Allergies Past Medical History:  Diagnosis Date  . Allergy    occasionally   . CAD in native artery    a. 2008 - Single-vessel CAD with stent placement to the circumflex  b. 2012 - DES to Diag.  Scott. 06/2014 - RCA 25% stenosis, LAD 30% stenosis, ramus intermediate 45% stenosis, D1 95% stenosis treated with DES  d.01/2015 - nonobstructive CAD  . GERD (gastroesophageal reflux disease)   . Hyperlipidemia, mixed   . Hypertension   . Myocardial infarction ([place]) 2008  . Overweight(278.02)   . Sleep apnea    CPAP     Current Outpatient Medications:  .  amLODipine (NORVASC) 10 MG tablet, Take 1 tablet (10 mg total) by mouth daily., Disp: 90 tablet, Rfl: 3 .  aspirin 81 MG tablet, Take 81 mg by mouth daily.  , Disp: , Rfl:  .  atorvastatin (LIPITOR) 40 MG tablet, TAKE 1 TABLET BY MOUTH AT 6 IN THE EVENING, Disp: 90 tablet, Rfl: 3 .  docusate sodium (COLACE) 100 MG capsule, Take 1 capsule (100 mg total) by mouth 2 (two) times daily., Disp: 60 capsule, Rfl: 2 .  hydrochlorothiazide (HYDRODIURIL) 25 MG tablet, Take 1 tablet (25 mg total) by mouth daily., Disp: 90 tablet, Rfl: 3 .  linaclotide (LINZESS) 290 MCG CAPS capsule, Take 1 capsule (290 mcg total) by mouth daily before breakfast., Disp: 4 capsule, Rfl: 0 .  lisinopril (ZESTRIL) 40 MG tablet, Take 1 tablet (40 mg total) by mouth daily., Disp: 90 tablet, Rfl: 3 .  metoprolol tartrate (LOPRESSOR) 50 MG tablet, Take 1 tablet (50 mg total) by mouth 2 (two) times daily., Disp: 180 tablet, Rfl: 3 .  nitroGLYCERIN (NITROSTAT) 0.4 MG SL tablet, Place 1 tablet (0.4 mg total) under the tongue every 5 (five) minutes as needed. May repeat for up to 3 doses., Disp: 25 tablet, Rfl: prn .  varenicline (CHANTIX CONTINUING MONTH PAK) 1 MG tablet, Take 1 tablet (1 mg total) by mouth 2 (two) times daily., Disp: 60 tablet, Rfl: 2  Assessment/ Plan: 59 y.o. male   1. Subacute frontal sinusitis I  reviewed his notes in the EMR from his recent urgent care visit.  Because he is having ongoing symptoms that did not respond to conservative measures and thus far prescribed medications, will empirically treat with oral antibiotics.  I have sent in guaifenesin AC for him to use at nighttime for cough.  He understands red flag signs and symptoms and reasons for reevaluation.  He has an appointment with me in 10 days.  If he is not improving, he will contact me and we will move this out a little further.  I have also encouraged him to go ahead and start an oral antihistamine like  Claritin and start back on his nasal spray for sinus congestion.  Meds ordered this encounter  Medications  . amoxicillin-clavulanate (AUGMENTIN) 875-125 MG tablet    Sig: Take 1 tablet by mouth 2 (two) times daily.    Dispense:  20 tablet    Refill:  0  . guaiFENesin-codeine 100-10 MG/5ML syrup    Sig: Take 5 mLs by mouth every 6 (six) hours as needed for cough.    Dispense:  120 mL    Refill:  0    Start time: 11:55am End time: 12:05am  Total time spent on patient care (including telephone call/ virtual visit): 20 minutes  [place], [place] [phone]

## 2019-05-09 ENCOUNTER — Encounter: Payer: Self-pay | Admitting: Family Medicine

## 2019-05-09 ENCOUNTER — Other Ambulatory Visit: Payer: Self-pay | Admitting: Family Medicine

## 2019-05-09 ENCOUNTER — Ambulatory Visit (INDEPENDENT_AMBULATORY_CARE_PROVIDER_SITE_OTHER): Payer: Commercial Managed Care - PPO

## 2019-05-09 ENCOUNTER — Other Ambulatory Visit: Payer: Self-pay

## 2019-05-09 ENCOUNTER — Ambulatory Visit: Payer: Commercial Managed Care - PPO | Admitting: Family Medicine

## 2019-05-09 VITALS — BP 128/76 | HR 95 | Temp 99.6°F | Wt 288.0 lb

## 2019-05-09 DIAGNOSIS — I10 Essential (primary) hypertension: Secondary | ICD-10-CM | POA: Diagnosis not present

## 2019-05-09 DIAGNOSIS — Z125 Encounter for screening for malignant neoplasm of prostate: Secondary | ICD-10-CM

## 2019-05-09 DIAGNOSIS — I2510 Atherosclerotic heart disease of native coronary artery without angina pectoris: Secondary | ICD-10-CM

## 2019-05-09 DIAGNOSIS — L918 Other hypertrophic disorders of the skin: Secondary | ICD-10-CM

## 2019-05-09 DIAGNOSIS — R7309 Other abnormal glucose: Secondary | ICD-10-CM

## 2019-05-09 DIAGNOSIS — Z130 Encounter for screening for diseases of the blood and blood-forming organs and certain disorders involving the immune mechanism: Secondary | ICD-10-CM

## 2019-05-09 DIAGNOSIS — R062 Wheezing: Secondary | ICD-10-CM | POA: Diagnosis not present

## 2019-05-09 DIAGNOSIS — F17200 Nicotine dependence, unspecified, uncomplicated: Secondary | ICD-10-CM | POA: Diagnosis not present

## 2019-05-09 DIAGNOSIS — R739 Hyperglycemia, unspecified: Secondary | ICD-10-CM

## 2019-05-09 LAB — BAYER DCA HB A1C WAIVED: HB A1C (BAYER DCA - WAIVED): 7 % — ABNORMAL HIGH (ref <7.0)

## 2019-05-09 MED ORDER — ANORO ELLIPTA 62.5-25 MCG/INH IN AEPB: 1.0000 | INHALATION_SPRAY | Freq: Every day | RESPIRATORY_TRACT | 0 refills | Status: DC

## 2019-05-09 MED ORDER — NITROGLYCERIN 0.4 MG SL SUBL: 0.4000 mg | SUBLINGUAL_TABLET | SUBLINGUAL | 99 refills | Status: DC | PRN

## 2019-05-09 NOTE — Progress Notes (Signed)
Discussed A1c elevation.  I am going to allow him to wait for 3 months before we will repeat this and give a formal diagnosis of diabetes. We did discuss that A1c over 6.5 is diabetic range.  I am placing a referral to dietitian for further assistance.

## 2019-05-09 NOTE — Progress Notes (Signed)
Subjective: CC: Skin tag removal PCP: Janora Norlander, DO [id number] Scott Hatfield is a 59 y.o. male presenting to clinic today for:  1. Skin tags Patient reports that he has had skin tags for a while now.  He denies bleeding but reports redness and pain when they get caught.  They are located under his arms bilaterally.  He denies any of the lesions growing in size, changing colors.  Denies history of skin cancer.  No known Family history of skin cancer.  No anticoagulation/ antiplatelet medications.  He is an every day smoker.  He's had some ongoing chest congestion since he had a sinus infection 2 weeks ago.  Reports thick phlegm.   ROS: Per HPI  No Known Allergies Past Medical History:  Diagnosis Date  . Allergy    occasionally   . CAD in native artery    a. 2008 - Single-vessel CAD with stent placement to the circumflex  b. 2012 - DES to Diag.  Scott. 06/2014 - RCA 25% stenosis, LAD 30% stenosis, ramus intermediate 45% stenosis, D1 95% stenosis treated with DES  d.01/2015 - nonobstructive CAD  . GERD (gastroesophageal reflux disease)   . Hyperlipidemia, mixed   . Hypertension   . Myocardial infarction ([place]) 2008  . Overweight(278.02)   . Sleep apnea    CPAP    Current Outpatient Medications:  .  amLODipine (NORVASC) 10 MG tablet, Take 1 tablet (10 mg total) by mouth daily., Disp: 90 tablet, Rfl: 3 .  aspirin 81 MG tablet, Take 81 mg by mouth daily.  , Disp: , Rfl:  .  atorvastatin (LIPITOR) 40 MG tablet, TAKE 1 TABLET BY MOUTH AT 6 IN THE EVENING, Disp: 90 tablet, Rfl: 3 .  docusate sodium (COLACE) 100 MG capsule, Take 1 capsule (100 mg total) by mouth 2 (two) times daily., Disp: 60 capsule, Rfl: 2 .  guaiFENesin-codeine 100-10 MG/5ML syrup, Take 5 mLs by mouth every 6 (six) hours as needed for cough., Disp: 120 mL, Rfl: 0 .  hydrochlorothiazide (HYDRODIURIL) 25 MG tablet, Take 1 tablet (25 mg total) by mouth daily., Disp: 90 tablet, Rfl: 3 .  linaclotide (LINZESS) 290 MCG CAPS  capsule, Take 1 capsule (290 mcg total) by mouth daily before breakfast., Disp: 4 capsule, Rfl: 0 .  lisinopril (ZESTRIL) 40 MG tablet, Take 1 tablet (40 mg total) by mouth daily., Disp: 90 tablet, Rfl: 3 .  metoprolol tartrate (LOPRESSOR) 50 MG tablet, Take 1 tablet (50 mg total) by mouth 2 (two) times daily., Disp: 180 tablet, Rfl: 3 .  nitroGLYCERIN (NITROSTAT) 0.4 MG SL tablet, Place 1 tablet (0.4 mg total) under the tongue every 5 (five) minutes as needed. May repeat for up to 3 doses., Disp: 25 tablet, Rfl: prn .  umeclidinium-vilanterol (ANORO ELLIPTA) 62.5-25 MCG/INH AEPB, Inhale 1 puff into the lungs daily., Disp: 1 each, Rfl: 0 .  varenicline (CHANTIX CONTINUING MONTH PAK) 1 MG tablet, Take 1 tablet (1 mg total) by mouth 2 (two) times daily. (Patient not taking: Reported on 05/09/2019), Disp: 60 tablet, Rfl: 2 Social History   Socioeconomic History  . Marital status: Married    Spouse name: Not on file  . Number of children: Not on file  . Years of education: Not on file  . Highest education level: Not on file  Occupational History  . Occupation: Full time    Employer: [employer]  Tobacco Use  . Smoking status: Former Smoker    Start date: 03/20/2012  Quit date: 01/30/2017    Years since quitting: 2.2  . Smokeless tobacco: Never Used  Substance and Sexual Activity  . Alcohol use: Yes    Alcohol/week: 0.0 standard drinks    Comment: occ  . Drug use: No  . Sexual activity: Not on file  Other Topics Concern  . Not on file  Social History Narrative   Married   No regular exercise   Social Determinants of Health   Financial Resource Strain:   . Difficulty of Paying Living Expenses:   Food Insecurity:   . Worried About Charity fundraiser in the Last Year:   . Arboriculturist in the Last Year:   Transportation Needs:   . Film/video editor (Medical):   Marland Kitchen Lack of Transportation (Non-Medical):   Physical Activity:   . Days of Exercise per Week:   . Minutes  of Exercise per Session:   Stress:   . Feeling of Stress :   Social Connections:   . Frequency of Communication with Friends and Family:   . Frequency of Social Gatherings with Friends and Family:   . Attends Religious Services:   . Active Member of Clubs or Organizations:   . Attends Archivist Meetings:   Marland Kitchen Marital Status:   Intimate Partner Violence:   . Fear of Current or Ex-Partner:   . Emotionally Abused:   Marland Kitchen Physically Abused:   . Sexually Abused:    Family History  Problem Relation Age of Onset  . Heart disease Mother   . Colon polyps Father   . Coronary artery disease Other   . Colon polyps Sister   . Colon cancer Neg Hx   . Esophageal cancer Neg Hx   . Rectal cancer Neg Hx   . Stomach cancer Neg Hx     Objective: Office vital signs reviewed. BP 128/76   Pulse 95   Temp 99.6 F (37.6 Scott) (Temporal)   Wt 288 lb (130.6 kg)   SpO2 96%   BMI 39.06 kg/m   Physical Examination:  General: Awake, alert, well appearing, No acute distress HEENT: Normal, sclera white, MMM Cardio: regular rate and rhythm, S1S2 heard, no murmurs appreciated Pulm: Global inspiratory wheeze.  Good air movement.  Normal work of breathing on room air Skin: dry; intact; no rashes.  3 small skin tags noted along the left axillary region with 2 small skin tags under the right.  No bleeding or concerning characteristics.    PROCEDURE NOTE: Skin tag removal Patient given informed consent, signed copy in the chart.  Appropriate time out taken. Areas of concern cleansed with alcohol swabs.  Areas were anesthetized with ethyl chloride spray.  Once anaesthesia obtained, skin tags were removed using sterile scissors.  5 skin tags were removed in total.  The patient tolerated the procedure well.  Minimal bleeding.  Patient given post procedure instructions.   Assessment/ Plan: 59 y.o. male   1. Inflamed skin tag Treated with excision today.  Patient tolerated procedure without difficulty.   Less than 1 cc blood loss  2. Coronary artery disease involving native coronary artery of native heart without angina pectoris Check nonfasting lipid panel - CMP14+EGFR - TSH - Lipid Panel  3. Essential hypertension Controlled - CMP14+EGFR  4. Screening for malignant neoplasm of prostate - PSA  5. Screening, anemia, deficiency, iron - CBC  6. Morbid obesity ([place]) - CMP14+EGFR - TSH - Bayer DCA Hb A1c Waived - Lipid Panel  7. Inspiratory wheezing ?  Reactive versus early COPD that is uncontrolled.  Chest x-ray ordered.  He status post treatment with oral antibiotics.  Anoro ordered.  If no improvement in the next few days with this medication, may need to consider addition of corticosteroid - DG Chest 2 View; Future - umeclidinium-vilanterol (ANORO ELLIPTA) 62.5-25 MCG/INH AEPB; Inhale 1 puff into the lungs daily.  Dispense: 1 each; Refill: 0  8. Tobacco use disorder Counseling for smoking cessation - DG Chest 2 View; Future - umeclidinium-vilanterol (ANORO ELLIPTA) 62.5-25 MCG/INH AEPB; Inhale 1 puff into the lungs daily.  Dispense: 1 each; Refill: 0   Orders Placed This Encounter  Procedures  . DG Chest 2 View    Standing Status:   Future    Number of Occurrences:   1    Standing Expiration Date:   07/08/2020    Order Specific Question:   Reason for Exam (SYMPTOM  OR DIAGNOSIS REQUIRED)    Answer:   inspiratory wheezing, tobacco user    Order Specific Question:   Preferred imaging location?    Answer:   Internal    Order Specific Question:   Radiology Contrast Protocol - do NOT remove file path    Answer:   _0 [email or web address]  . CMP14+EGFR  . TSH  . Bayer DCA Hb A1c Waived  . Lipid Panel  . PSA  . CBC   Meds ordered this encounter  Medications  . umeclidinium-vilanterol (ANORO ELLIPTA) 62.5-25 MCG/INH AEPB    Sig: Inhale 1 puff into the lungs daily.    Dispense:  1 each    Refill:  [place],  [place] [phone]

## 2019-05-09 NOTE — Addendum Note (Signed)
Addended byCarrolyn Leigh on: 05/09/2019 04:14 PM   Modules accepted: Orders

## 2019-05-09 NOTE — Patient Instructions (Addendum)
Carbohydrate Counting for Diabetes Mellitus, Adult  Carbohydrate counting is a method of keeping track of how many carbohydrates you eat. Eating carbohydrates naturally increases the amount of sugar (glucose) in the blood. Counting how many carbohydrates you eat helps keep your blood glucose within normal limits, which helps you manage your diabetes (diabetes mellitus). It is important to know how many carbohydrates you can safely have in each meal. This is different for every person. A diet and nutrition specialist (registered dietitian) can help you make a meal plan and calculate how many carbohydrates you should have at each meal and snack. Carbohydrates are found in the following foods:  Grains, such as breads and cereals.  Dried beans and soy products.  Starchy vegetables, such as potatoes, peas, and corn.  Fruit and fruit juices.  Milk and yogurt.  Sweets and snack foods, such as cake, cookies, candy, chips, and soft drinks. How do I count carbohydrates? There are two ways to count carbohydrates in food. You can use either of the methods or a combination of both. Reading "Nutrition Facts" on packaged food The "Nutrition Facts" list is included on the labels of almost all packaged foods and beverages in the [place] It includes:  The serving size.  Information about nutrients in each serving, including the grams (g) of carbohydrate per serving. To use the "Nutrition Facts":  Decide how many servings you will have.  Multiply the number of servings by the number of carbohydrates per serving.  The resulting number is the total amount of carbohydrates that you will be having. Learning standard serving sizes of other foods When you eat carbohydrate foods that are not packaged or do not include "Nutrition Facts" on the label, you need to measure the servings in order to count the amount of carbohydrates:  Measure the foods that you will eat with a food scale or measuring cup, if  needed.  Decide how many standard-size servings you will eat.  Multiply the number of servings by 15. Most carbohydrate-rich foods have about 15 g of carbohydrates per serving. ? For example, if you eat 8 oz (170 g) of strawberries, you will have eaten 2 servings and 30 g of carbohydrates (2 servings x 15 g = 30 g).  For foods that have more than one food mixed, such as soups and casseroles, you must count the carbohydrates in each food that is included. The following list contains standard serving sizes of common carbohydrate-rich foods. Each of these servings has about 15 g of carbohydrates:   hamburger bun or  English muffin.   oz (15 mL) syrup.   oz (14 g) jelly.  1 slice of bread.  1 six-inch tortilla.  3 oz (85 g) cooked rice or pasta.  4 oz (113 g) cooked dried beans.  4 oz (113 g) starchy vegetable, such as peas, corn, or potatoes.  4 oz (113 g) hot cereal.  4 oz (113 g) mashed potatoes or  of a large baked potato.  4 oz (113 g) canned or frozen fruit.  4 oz (120 mL) fruit juice.  4-6 crackers.  6 chicken nuggets.  6 oz (170 g) unsweetened dry cereal.  6 oz (170 g) plain fat-free yogurt or yogurt sweetened with artificial sweeteners.  8 oz (240 mL) milk.  8 oz (170 g) fresh fruit or one small piece of fruit.  24 oz (680 g) popped popcorn. Example of carbohydrate counting Sample meal  3 oz (85 g) chicken breast.  6 oz (170 g)   brown rice.  4 oz (113 g) corn.  8 oz (240 mL) milk.  8 oz (170 g) strawberries with sugar-free whipped topping. Carbohydrate calculation 1. Identify the foods that contain carbohydrates: ? Rice. ? Corn. ? Milk. ? Strawberries. 2. Calculate how many servings you have of each food: ? 2 servings rice. ? 1 serving corn. ? 1 serving milk. ? 1 serving strawberries. 3. Multiply each number of servings by 15 g: ? 2 servings rice x 15 g = 30 g. ? 1 serving corn x 15 g = 15 g. ? 1 serving milk x 15 g = 15 g. ? 1  serving strawberries x 15 g = 15 g. 4. Add together all of the amounts to find the total grams of carbohydrates eaten: ? 30 g + 15 g + 15 g + 15 g = 75 g of carbohydrates total. Summary  Carbohydrate counting is a method of keeping track of how many carbohydrates you eat.  Eating carbohydrates naturally increases the amount of sugar (glucose) in the blood.  Counting how many carbohydrates you eat helps keep your blood glucose within normal limits, which helps you manage your diabetes.  A diet and nutrition specialist (registered dietitian) can help you make a meal plan and calculate how many carbohydrates you should have at each meal and snack. This information is not intended to replace advice given to you by your health care provider. Make sure you discuss any questions you have with your health care provider. Document Revised: 08/24/2016 Document Reviewed: 07/14/2015 Elsevier Patient Education  2020 [place] had labs performed today.  You will be contacted with the results of the labs once they are available, usually in the next 3 business days for routine lab work.  If you have an active my chart account, they will be released to your MyChart.  If you prefer to have these labs released to you via telephone, please let us know.  If you had a pap smear or biopsy performed, expect to be contacted in about 7-10 days.   Skin Tag Removal Wound Care instructions  . Sometimes bandages are not necessary.  If a bandage is used, leave the original bandage on for 24 hours if possible.  If the bandage becomes soaked or soiled before that time, it is OK to remove it and examine the wound.  A small amount of post-operative bleeding is normal.  If excessive bleeding occurs, remove the bandage, place gauze over the site and apply continuous pressure (no peeking) over the area for 20-30 minutes.  If this does not stop the bleeding, try again for 40 minutes.  If this does not work, please  call our clinic as soon as possible (even if after hours).    . Once a day, cleanse the wound with soap and water.  If a thick crust develops you may use a Q-tip dipped into dilute hydrogen peroxide (mix 1:1 with water) to dissolve it.  Hydrogen peroxide can slow the healing process, so use it only as needed.  After washing, apply Vaseline jelly or Polysporin ointment.  For best healing, the wound should be covered with a layer of ointment at all times.  This may mean re-applying the ointment several times a day.  For open wounds, continue until it has healed.    . If you have any swelling, keep the area elevated.  . Some redness, tenderness and white or yellow material in the wound is normal healing.  If the area becomes very sore and red, or develops a thick yellow-green material (pus), it may be infected; please notify us.    . Wound healing continues for up to one year following surgery.  It is not unusual to experience pain in the scar from time to time during the interval.  If the pain becomes severe or the scar thickens, you should notify the office.  A slight amount of redness in a scar is expected for the first six months.  After six months, the redness subsides and the scar will soften and fade.  The color difference becomes less noticeable with time.  If there are any problems, return for a post-op surgery check at your earliest convenience.  . It is important to wear sunscreen daily with an SPF of 30 or higher over the treated sites and to wear sun-protective clothing.  . Please call our office for any questions or concerns.

## 2019-05-09 NOTE — Addendum Note (Signed)
Addended by: Earlene Plater on: 05/09/2019 04:11 PM   Modules accepted: Orders

## 2019-05-10 LAB — LIPID PANEL
Chol/HDL Ratio: 3.8 ratio (ref 0.0–5.0)
Cholesterol, Total: 140 mg/dL (ref 100–199)
HDL: 37 mg/dL — ABNORMAL LOW (ref >39)
LDL Chol Calc (NIH): 78 mg/dL (ref 0–99)
Triglycerides: 141 mg/dL (ref 0–149)
VLDL Cholesterol Cal: 25 mg/dL (ref 5–40)

## 2019-05-10 LAB — CBC
Hematocrit: 52.5 % — ABNORMAL HIGH (ref 37.5–51.0)
Hemoglobin: 18.2 g/dL — ABNORMAL HIGH (ref 13.0–17.7)
MCH: 32.3 pg (ref 26.6–33.0)
MCHC: 34.7 g/dL (ref 31.5–35.7)
MCV: 93 fL (ref 79–97)
Platelets: 268 10*3/uL (ref 150–450)
RBC: 5.64 x10E6/uL (ref 4.14–5.80)
RDW: 12.1 % (ref 11.6–15.4)
WBC: 9.8 10*3/uL (ref 3.4–10.8)

## 2019-05-10 LAB — PSA: Prostate Specific Ag, Serum: 0.5 ng/mL (ref 0.0–4.0)

## 2019-05-10 LAB — CMP14+EGFR
ALT: 33 IU/L (ref 0–44)
AST: 18 IU/L (ref 0–40)
Albumin/Globulin Ratio: 1.5 (ref 1.2–2.2)
Albumin: 4.5 g/dL (ref 3.8–4.9)
Alkaline Phosphatase: 85 IU/L (ref 39–117)
BUN/Creatinine Ratio: 12 (ref 9–20)
BUN: 11 mg/dL (ref 6–24)
Bilirubin Total: 0.5 mg/dL (ref 0.0–1.2)
CO2: 21 mmol/L (ref 20–29)
Calcium: 9.4 mg/dL (ref 8.7–10.2)
Chloride: 103 mmol/L (ref 96–106)
Creatinine, Ser: 0.9 mg/dL (ref 0.76–1.27)
GFR calc Af Amer: 108 mL/min/{1.73_m2} (ref >59)
GFR calc non Af Amer: 94 mL/min/{1.73_m2} (ref >59)
Globulin, Total: 3 g/dL (ref 1.5–4.5)
Glucose: 87 mg/dL (ref 65–99)
Potassium: 4.2 mmol/L (ref 3.5–5.2)
Sodium: 140 mmol/L (ref 134–144)
Total Protein: 7.5 g/dL (ref 6.0–8.5)

## 2019-05-10 LAB — TSH: TSH: 2.15 u[IU]/mL (ref 0.450–4.500)

## 2019-05-20 DIAGNOSIS — Z720 Tobacco use: Secondary | ICD-10-CM | POA: Insufficient documentation

## 2019-05-20 DIAGNOSIS — Z7189 Other specified counseling: Secondary | ICD-10-CM | POA: Insufficient documentation

## 2019-05-20 NOTE — Progress Notes (Signed)
Cardiology Office Note   Date:  05/22/2019   ID:  Scott Hatfield, DOB 09-09-1960, MRN [id number]  PCP:  Janora Norlander, DO  Cardiologist:   No primary care provider on file.   Chief Complaint  Patient presents with  . Coronary Artery Disease      History of Present Illness: Scott Hatfield is a 59 y.o. male who presents for evaluation of coronary artery disease. He had a history of circumflex stenting in another town in 2008. He presented urgently in 10/12 with unstable angina and ST segment elevation and was found to have an occluded diagonal. He had a drug-eluting stent placed.  He was in the hospital in May of 2016 with unstable angina and was found to have 95% stenosis in the diagonal again treated with a drug-eluting stent.  He was again in the hospital in Dec of 2016.   He again had chest pain but cath at that time demonstrated non obstructive disease with patent stents.  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  He was managed medically.   Since I last saw him he has done okay.  He did have Covid in the beginning of December.  He felt this was relatively mild.  He had a little continued shortness of breath following up that he had chest x-ray not long ago that was normal.  He is not had any chest pressure, neck or arm discomfort.  He has had no new PND or orthopnea.  He did stop smoking but then he got up to 302 pounds and he started smoking again.  He dropped about 20 pounds after this.  Past Medical History:  Diagnosis Date  . Allergy    occasionally   . CAD in native artery    a. 2008 - Single-vessel CAD with stent placement to the circumflex  b. 2012 - DES to Diag.  c. 06/2014 - RCA 25% stenosis, LAD 30% stenosis, ramus intermediate 45% stenosis, D1 95% stenosis treated with DES  d.01/2015 - nonobstructive CAD  . GERD (gastroesophageal reflux disease)   . Hyperlipidemia, mixed   . Hypertension   . Myocardial infarction  ([place]) 2008  . Overweight(278.02)   . Sleep apnea    CPAP    Past Surgical History:  Procedure Laterality Date  . CARDIAC CATHETERIZATION N/A 07/03/2014   Procedure: Left Heart Cath and Coronary Angiography;  Surgeon: Peter M Martinique, MD;  Location: [place] [facility];  Service: Cardiovascular;  Laterality: N/A;  . CARDIAC CATHETERIZATION N/A 07/03/2014   Procedure: Coronary Stent Intervention;  Surgeon: Peter M Martinique, MD;  Location: [facility] [facility];  Service: Cardiovascular;  Laterality: N/A;  . CARDIAC CATHETERIZATION N/A 01/26/2015   Procedure: Left Heart Cath and Coronary Angiography;  Surgeon: Peter M Martinique, MD;  Location: [place] [place] [facility];  Service: Cardiovascular;  Laterality: N/A;  . CORONARY STENT PLACEMENT  2008, 2012  . wrist  surgery       Current Outpatient Medications  Medication Sig Dispense Refill  . amLODipine (NORVASC) 10 MG tablet Take 1 tablet (10 mg total) by mouth daily. 90 tablet 3  . aspirin 81 MG tablet Take 81 mg by mouth daily.      Marland Kitchen atorvastatin (LIPITOR) 40 MG tablet TAKE 1 TABLET BY MOUTH AT 6 IN THE EVENING 90 tablet 3  . docusate sodium (COLACE) 100 MG capsule Take 1 capsule (100 mg total) by mouth 2 (two) times daily.  60 capsule 2  . hydrochlorothiazide (HYDRODIURIL) 25 MG tablet Take 1 tablet (25 mg total) by mouth daily. 90 tablet 3  . linaclotide (LINZESS) 290 MCG CAPS capsule Take 1 capsule (290 mcg total) by mouth daily before breakfast. 4 capsule 0  . lisinopril (ZESTRIL) 40 MG tablet Take 1 tablet (40 mg total) by mouth daily. 90 tablet 3  . metoprolol tartrate (LOPRESSOR) 50 MG tablet Take 1 tablet (50 mg total) by mouth 2 (two) times daily. 180 tablet 3  . nitroGLYCERIN (NITROSTAT) 0.4 MG SL tablet Place 1 tablet (0.4 mg total) under the tongue every 5 (five) minutes as needed. May repeat for up to 3 doses. 25 tablet prn  . umeclidinium-vilanterol (ANORO ELLIPTA) 62.5-25 MCG/INH AEPB Inhale 1 puff into the lungs daily. 1 each 0  .  varenicline (CHANTIX CONTINUING MONTH PAK) 1 MG tablet Take 1 tablet (1 mg total) by mouth 2 (two) times daily. 60 tablet 2  . guaiFENesin-codeine 100-10 MG/5ML syrup Take 5 mLs by mouth every 6 (six) hours as needed for cough. (Patient not taking: Reported on 05/21/2019) 120 mL 0   No current facility-administered medications for this visit.    Allergies:   Patient has no known allergies.    ROS:  Please see the history of present illness.   Otherwise, review of systems are positive for none.   All other systems are reviewed and negative.    PHYSICAL EXAM: VS:  BP 140/90   Pulse 84   Temp (!) 100.6 F (38.1 C)   Ht 6' (1.829 m)   Wt 286 lb (129.7 kg)   BMI 38.79 kg/m  , BMI Body mass index is 38.79 kg/m. GENERAL:  Well appearing NECK:  No jugular venous distention, waveform within normal limits, carotid upstroke brisk and symmetric, no bruits, no thyromegaly LUNGS:  Clear to auscultation bilaterally CHEST:  Unremarkable HEART:  PMI not displaced or sustained,S1 and S2 within normal limits, no S3, no S4, no clicks, no rubs, no murmurs ABD:  Flat, positive bowel sounds normal in frequency in pitch, no bruits, no rebound, no guarding, no midline pulsatile mass, no hepatomegaly, no splenomegaly EXT:  2 plus pulses throughout, no edema, no cyanosis no clubbing    EKG:  EKG is ordered today. The ekg ordered today demonstrates sinus rhythm, rate 84, axis within normal limits, intervals within normal limits, no acute ST-T wave changes.   Recent Labs: 05/09/2019: ALT 33; BUN 11; Creatinine, Ser 0.90; Hemoglobin 18.2; Platelets 268; Potassium 4.2; Sodium 140; TSH 2.150    Lipid Panel    Component Value Date/Time   CHOL 140 05/09/2019 1605   TRIG 141 05/09/2019 1605   HDL 37 (L) 05/09/2019 1605   CHOLHDL 3.8 05/09/2019 1605   CHOLHDL 4.3 01/26/2015 0621   VLDL 26 01/26/2015 0621   LDLCALC 78 05/09/2019 1605      Wt Readings from Last 3 Encounters:  05/21/19 286 lb (129.7 kg)    05/09/19 288 lb (130.6 kg)  09/05/18 295 lb (133.8 kg)      Other studies Reviewed: Additional studies/ records that were reviewed today include: Labs. Review of the above records demonstrates:  Please see elsewhere in the note.     ASSESSMENT AND PLAN:  CAD, NATIVE VESSEL -  He has had no new chest discomfort.  We will continue with risk reduction.   HYPERTENSION, UNSPECIFIED -  Blood pressure is upper limits of normal.  I am asking him to drop some weight but would  not change his meds as he seems to be at target typically.  It was well controlled the other day.   HYPERLIPIDEMIA-MIXED -  LDL 78.  HDL 37.  We will continue with the meds as listed.  TOBACCO ABUSE -  We talked about needing to be off cigarettes completely and he will work on it this summer.    OVERWEIGHT/OBESITY -  We talked about weight loss with diet and exercise.   SLEEP APNEA -  His CPAP is broken.  He did not need a repeat study.  He went to the study and they told him his settings and machine were fine but his machine needs to be replaced so I am writing a letter.   FATIGUE - He has had no clear etiology to this and this is baseline and not any worse.  No change in therapy.  COVID EDUCATION - We talked at length about the vaccine.   Current medicines are reviewed at length with the patient today.  The patient does not have concerns regarding medicines.  The following changes have been made:  no change  Labs/ tests ordered today include:   Orders Placed This Encounter  Procedures  . EKG 12-Lead     Disposition:   FU with me in 12 months    Signed, Minus Breeding, MD  05/22/2019 7:47 AM    [place]

## 2019-05-21 ENCOUNTER — Encounter: Payer: Self-pay | Admitting: Cardiology

## 2019-05-21 ENCOUNTER — Encounter: Payer: Self-pay | Admitting: *Deleted

## 2019-05-21 ENCOUNTER — Ambulatory Visit: Payer: Commercial Managed Care - PPO | Admitting: Cardiology

## 2019-05-21 ENCOUNTER — Other Ambulatory Visit: Payer: Self-pay

## 2019-05-21 VITALS — BP 140/90 | HR 84 | Temp 100.6°F | Ht 72.0 in | Wt 286.0 lb

## 2019-05-21 DIAGNOSIS — I10 Essential (primary) hypertension: Secondary | ICD-10-CM | POA: Diagnosis not present

## 2019-05-21 DIAGNOSIS — I2510 Atherosclerotic heart disease of native coronary artery without angina pectoris: Secondary | ICD-10-CM | POA: Diagnosis not present

## 2019-05-21 DIAGNOSIS — Z7189 Other specified counseling: Secondary | ICD-10-CM

## 2019-05-21 DIAGNOSIS — Z720 Tobacco use: Secondary | ICD-10-CM

## 2019-05-21 DIAGNOSIS — E785 Hyperlipidemia, unspecified: Secondary | ICD-10-CM | POA: Diagnosis not present

## 2019-05-21 DIAGNOSIS — R5383 Other fatigue: Secondary | ICD-10-CM

## 2019-05-21 NOTE — Patient Instructions (Signed)
Medication Instructions:  The current medical regimen is effective;  continue present plan and medications.  *If you need a refill on your cardiac medications before your next appointment, please call your pharmacy*  Follow-Up: At [facility], you and your health needs are our priority.  As part of our continuing mission to provide you with exceptional heart care, we have created designated Provider Care Teams.  These Care Teams include your primary Cardiologist (physician) and Advanced Practice Providers (APPs -  Physician Assistants and Nurse Practitioners) who all work together to provide you with the care you need, when you need it.  We recommend signing up for the patient portal called "MyChart".  Sign up information is provided on this After Visit Summary.  MyChart is used to connect with patients for Virtual Visits (Telemedicine).  Patients are able to view lab/test results, encounter notes, upcoming appointments, etc.  Non-urgent messages can be sent to your provider as well.   To learn more about what you can do with MyChart, go to [email or web address].    Your next appointment:   12 month(s)  The format for your next appointment:   In Person  Provider:   Dr Percival Spanish in [place]   Thank you for choosing [facility]!!

## 2019-05-22 ENCOUNTER — Encounter: Payer: Self-pay | Admitting: Cardiology

## 2019-08-06 ENCOUNTER — Ambulatory Visit: Payer: Commercial Managed Care - PPO | Admitting: Family Medicine

## 2019-08-06 ENCOUNTER — Other Ambulatory Visit: Payer: Self-pay

## 2019-08-06 ENCOUNTER — Encounter: Payer: Self-pay | Admitting: Family Medicine

## 2019-08-06 VITALS — BP 137/83 | HR 84 | Temp 98.3°F | Ht 72.0 in | Wt 283.2 lb

## 2019-08-06 DIAGNOSIS — F439 Reaction to severe stress, unspecified: Secondary | ICD-10-CM | POA: Diagnosis not present

## 2019-08-06 DIAGNOSIS — Z720 Tobacco use: Secondary | ICD-10-CM | POA: Diagnosis not present

## 2019-08-06 DIAGNOSIS — R7309 Other abnormal glucose: Secondary | ICD-10-CM | POA: Diagnosis not present

## 2019-08-06 LAB — BAYER DCA HB A1C WAIVED: HB A1C (BAYER DCA - WAIVED): 6.2 % (ref <7.0)

## 2019-08-06 MED ORDER — BUPROPION HCL ER (XL) 150 MG PO TB24: 150.0000 mg | ORAL_TABLET | Freq: Every morning | ORAL | 2 refills | Status: DC

## 2019-08-06 NOTE — Patient Instructions (Signed)
Message me in 4 weeks and let me know how things are going. If you think you need a dose increase, call the office and we can do a telephone visit (or in office whatever you want).  Otherwise, see me in 3 months.   Stress, Adult Stress is a normal reaction to life events. Stress is what you feel when life demands more than you are used to, or more than you think you can handle. Some stress can be useful, such as studying for a test or meeting a deadline at work. Stress that occurs too often or for too long can cause problems. It can affect your emotional health and interfere with relationships and normal daily activities. Too much stress can weaken your body's defense system (immune system) and increase your risk for physical illness. If you already have a medical problem, stress can make it worse. What are the causes? All sorts of life events can cause stress. An event that causes stress for one person may not be stressful for another person. Major life events, whether positive or negative, commonly cause stress. Examples include:  Losing a job or starting a new job.  Losing a loved one.  Moving to a new town or home.  Getting married or divorced.  Having a baby.  Getting injured or sick. Less obvious life events can also cause stress, especially if they occur day after day or in combination with each other. Examples include:  Working long hours.  Driving in traffic.  Caring for children.  Being in debt.  Being in a difficult relationship. What are the signs or symptoms? Stress can cause emotional symptoms, including:  Anxiety. This is feeling worried, afraid, on edge, overwhelmed, or out of control.  Anger, including irritation or impatience.  Depression. This is feeling sad, down, helpless, or guilty.  Trouble focusing, remembering, or making decisions. Stress can cause physical symptoms, including:  Aches and pains. These may affect your head, neck, back, stomach, or  other areas of your body.  Tight muscles or a clenched jaw.  Low energy.  Trouble sleeping. Stress can cause unhealthy behaviors, including:  Eating to feel better (overeating) or skipping meals.  Working too much or putting off tasks.  Smoking, drinking alcohol, or using drugs to feel better. How is this diagnosed? Stress is diagnosed through an assessment by your health care provider. He or she may diagnose this condition based on:  Your symptoms and any stressful life events.  Your medical history.  Tests to rule out other causes of your symptoms. Depending on your condition, your health care provider may refer you to a specialist for further evaluation. How is this treated?  Stress management techniques are the recommended treatment for stress. Medicine is not typically recommended for the treatment of stress. Techniques to reduce your reaction to stressful life events include:  Stress identification. Monitor yourself for symptoms of stress and identify what causes stress for you. These skills may help you to avoid or prepare for stressful events.  Time management. Set your priorities, keep a calendar of events, and learn to say no. Taking these actions can help you avoid making too many commitments. Techniques for coping with stress include:  Rethinking the problem. Try to think realistically about stressful events rather than ignoring them or overreacting. Try to find the positives in a stressful situation rather than focusing on the negatives.  Exercise. Physical exercise can release both physical and emotional tension. The key is to find a form of  exercise that you enjoy and do it regularly.  Relaxation techniques. These relax the body and mind. The key is to find one or more that you enjoy and use the techniques regularly. Examples include: ? Meditation, deep breathing, or progressive relaxation techniques. ? Yoga or tai chi. ? Biofeedback, mindfulness techniques, or  journaling. ? Listening to music, being out in nature, or participating in other hobbies.  Practicing a healthy lifestyle. Eat a balanced diet, drink plenty of water, limit or avoid caffeine, and get plenty of sleep.  Having a strong support network. Spend time with family, friends, or other people you enjoy being around. Express your feelings and talk things over with someone you trust. Counseling or talk therapy with a mental health professional may be helpful if you are having trouble managing stress on your own. Follow these instructions at home: Lifestyle   Avoid drugs.  Do not use any products that contain nicotine or tobacco, such as cigarettes, e-cigarettes, and chewing tobacco. If you need help quitting, ask your health care provider.  Limit alcohol intake to no more than 1 drink a day for nonpregnant women and 2 drinks a day for men. One drink equals 12 oz of beer, 5 oz of wine, or 1 oz of hard liquor  Do not use alcohol or drugs to relax.  Eat a balanced diet that includes fresh fruits and vegetables, whole grains, lean meats, fish, eggs, and beans, and low-fat dairy. Avoid processed foods and foods high in added fat, sugar, and salt.  Exercise at least 30 minutes on 5 or more days each week.  Get 7-8 hours of sleep each night. General instructions   Practice stress management techniques as discussed with your health care provider.  Drink enough fluid to keep your urine clear or pale yellow.  Take over-the-counter and prescription medicines only as told by your health care provider.  Keep all follow-up visits as told by your health care provider. This is important. Contact a health care provider if:  Your symptoms get worse.  You have new symptoms.  You feel overwhelmed by your problems and can no longer manage them on your own. Get help right away if:  You have thoughts of hurting yourself or others. If you ever feel like you may hurt yourself or others, or  have thoughts about taking your own life, get help right away. You can go to your nearest emergency department or call:  Your local emergency services (911 in the [place]  A suicide crisis helpline, such as the [place] at 228-682-6002. This is open 24 hours a day. Summary  Stress is a normal reaction to life events. It can cause problems if it happens too often or for too long.  Practicing stress management techniques is the best way to treat stress.  Counseling or talk therapy with a mental health professional may be helpful if you are having trouble managing stress on your own. This information is not intended to replace advice given to you by your health care provider. Make sure you discuss any questions you have with your health care provider. Document Revised: 08/30/2018 Document Reviewed: 03/22/2016 Elsevier Patient Education  [place].

## 2019-08-06 NOTE — Progress Notes (Signed)
Subjective: CC: elevated glucose, obesity PCP: Janora Norlander, DO MPN:TIRWERX [name] is a 59 y.o. male presenting to clinic today for:  1. Obesity/ elevated A1c Patient was seen back in March.  He was noted to have an A1c of 7.0.  Because his blood sugars have been normal prior to that, he was not diagnosed as type 2 diabetes.  Lifestyle modification was recommended.  Referral to dietitian placed.  He returns today for repeat blood sugar check and interval checkup.  He has been really trying to cut back on what he is eating.  He has had a successful 6 pound weight loss but notes that he is kind of plateaued and is not sure where to go from here.  2.  Tobacco use disorder/ stress Patient had gotten down to 3 cigarettes/day on the Chantix but he "is fighting it".  He discontinued the Chantix and subsequently is back up to about a pack per day.  No hemoptysis, chest pain, shortness of breath, unplanned weight loss or night sweats.  He notes that really what triggers him to smoke is stress at home.  Unfortunately, he has been having some increased stress with his wife.  He is try talking to her about this but apparently she deflects quite a bit.  He previously did use Wellbutrin and would be willing to retry this though he does not recall it being especially effective.   ROS: Per HPI  No Known Allergies Past Medical History:  Diagnosis Date  . Allergy    occasionally   . CAD in native artery    a. 2008 - Single-vessel CAD with stent placement to the circumflex  b. 2012 - DES to Diag.  c. 06/2014 - RCA 25% stenosis, LAD 30% stenosis, ramus intermediate 45% stenosis, D1 95% stenosis treated with DES  d.01/2015 - nonobstructive CAD  . GERD (gastroesophageal reflux disease)   . Hyperlipidemia, mixed   . Hypertension   . Myocardial infarction ([place]) 2008  . Overweight(278.02)   . Sleep apnea    CPAP    Current Outpatient Medications:  .  amLODipine (NORVASC) 10 MG tablet, Take 1  tablet (10 mg total) by mouth daily., Disp: 90 tablet, Rfl: 3 .  aspirin 81 MG tablet, Take 81 mg by mouth daily.  , Disp: , Rfl:  .  atorvastatin (LIPITOR) 40 MG tablet, TAKE 1 TABLET BY MOUTH AT 6 IN THE EVENING, Disp: 90 tablet, Rfl: 3 .  docusate sodium (COLACE) 100 MG capsule, Take 1 capsule (100 mg total) by mouth 2 (two) times daily., Disp: 60 capsule, Rfl: 2 .  guaiFENesin-codeine 100-10 MG/5ML syrup, Take 5 mLs by mouth every 6 (six) hours as needed for cough., Disp: 120 mL, Rfl: 0 .  hydrochlorothiazide (HYDRODIURIL) 25 MG tablet, Take 1 tablet (25 mg total) by mouth daily., Disp: 90 tablet, Rfl: 3 .  linaclotide (LINZESS) 290 MCG CAPS capsule, Take 1 capsule (290 mcg total) by mouth daily before breakfast., Disp: 4 capsule, Rfl: 0 .  lisinopril (ZESTRIL) 40 MG tablet, Take 1 tablet (40 mg total) by mouth daily., Disp: 90 tablet, Rfl: 3 .  metoprolol tartrate (LOPRESSOR) 50 MG tablet, Take 1 tablet (50 mg total) by mouth 2 (two) times daily., Disp: 180 tablet, Rfl: 3 .  nitroGLYCERIN (NITROSTAT) 0.4 MG SL tablet, Place 1 tablet (0.4 mg total) under the tongue every 5 (five) minutes as needed. May repeat for up to 3 doses., Disp: 25 tablet, Rfl: prn .  umeclidinium-vilanterol (ANORO  ELLIPTA) 62.5-25 MCG/INH AEPB, Inhale 1 puff into the lungs daily., Disp: 1 each, Rfl: 0 .  varenicline (CHANTIX CONTINUING MONTH PAK) 1 MG tablet, Take 1 tablet (1 mg total) by mouth 2 (two) times daily., Disp: 60 tablet, Rfl: 2 Social History   Socioeconomic History  . Marital status: Married    Spouse name: Not on file  . Number of children: Not on file  . Years of education: Not on file  . Highest education level: Not on file  Occupational History  . Occupation: Full time    Employer: [employer]  Tobacco Use  . Smoking status: Former Smoker    Start date: 03/20/2012    Quit date: 01/30/2017    Years since quitting: 2.5  . Smokeless tobacco: Never Used  Vaping Use  . Vaping Use: Never used    Substance and Sexual Activity  . Alcohol use: Yes    Alcohol/week: 0.0 standard drinks    Comment: occ  . Drug use: No  . Sexual activity: Not on file  Other Topics Concern  . Not on file  Social History Narrative   Married   No regular exercise   Social Determinants of Health   Financial Resource Strain:   . Difficulty of Paying Living Expenses:   Food Insecurity:   . Worried About Charity fundraiser in the Last Year:   . Arboriculturist in the Last Year:   Transportation Needs:   . Film/video editor (Medical):   Marland Kitchen Lack of Transportation (Non-Medical):   Physical Activity:   . Days of Exercise per Week:   . Minutes of Exercise per Session:   Stress:   . Feeling of Stress :   Social Connections:   . Frequency of Communication with Friends and Family:   . Frequency of Social Gatherings with Friends and Family:   . Attends Religious Services:   . Active Member of Clubs or Organizations:   . Attends Archivist Meetings:   Marland Kitchen Marital Status:   Intimate Partner Violence:   . Fear of Current or Ex-Partner:   . Emotionally Abused:   Marland Kitchen Physically Abused:   . Sexually Abused:    Family History  Problem Relation Age of Onset  . Heart disease Mother   . Colon polyps Father   . Coronary artery disease Other   . Colon polyps Sister   . Colon cancer Neg Hx   . Esophageal cancer Neg Hx   . Rectal cancer Neg Hx   . Stomach cancer Neg Hx     Objective: Office vital signs reviewed. BP 137/83   Pulse 84   Temp 98.3 F (36.8 C)   Ht 6' (1.829 m)   Wt 283 lb 3.2 oz (128.5 kg)   SpO2 95%   BMI 38.41 kg/m   Physical Examination:  General: Awake, alert, obese,nontoxic appearing, No acute distress HEENT: Normal; sclera are injected Cardio: regular rate and rhythm, S1S2 heard, no murmurs appreciated Pulm: clear to auscultation bilaterally, no wheezes, rhonchi or rales; normal work of breathing on room air Psych: Mood stable, speech normal, affect  appropriate, patient is pleasant and interactive. Depression screen [facility] 2/9 08/06/2019 08/14/2018 12/28/2017  Decreased Interest 0 0 0  Down, Depressed, Hopeless 0 0 0  PHQ - 2 Score 0 0 0  Altered sleeping - - 0  Tired, decreased energy - - 0  Change in appetite - - 0  Feeling bad or failure about yourself  - -  0  Trouble concentrating - - 0  Moving slowly or fidgety/restless - - 0  Suicidal thoughts - - 0  PHQ-9 Score - - 0  Difficult doing work/chores - - Not difficult at all   No flowsheet data found.  Assessment/ Plan: 59 y.o. male   1. Elevated hemoglobin A1c measurement Has done a great job at reducing A1c to 6.2.  We discussed he is in the prediabetic range now.  I would like to see him back in 3 months to recheck.  We discussed that if we get to levels above 6.5 that diagnosis of diabetes will be made.  We discussed various ways that we may consider early intervention including initiation of Metformin, or even something like Ozempic or similar to improve weight.  He certainly meets metabolic syndrome criteria. - Bayer DCA Hb A1c Waived  2. Morbid obesity ([place]) I am adding Wellbutrin after discussing the various possible interventions today.  Hopefully this will also help with smoking cessation and mood - buPROPion (WELLBUTRIN XL) 150 MG 24 hr tablet; Take 1 tablet (150 mg total) by mouth in the morning.  Dispense: 30 tablet; Refill: 2  3. Stress As above - buPROPion (WELLBUTRIN XL) 150 MG 24 hr tablet; Take 1 tablet (150 mg total) by mouth in the morning.  Dispense: 30 tablet; Refill: 2  4. Tobacco use As above.  Action phase of smoking cessation - buPROPion (WELLBUTRIN XL) 150 MG 24 hr tablet; Take 1 tablet (150 mg total) by mouth in the morning.  Dispense: 30 tablet; Refill: 2   Orders Placed This Encounter  Procedures  . Bayer DCA Hb A1c Waived   Meds ordered this encounter  Medications  . buPROPion (WELLBUTRIN XL) 150 MG 24 hr tablet    Sig: Take 1 tablet (150 mg  total) by mouth in the morning.    Dispense:  30 tablet    Refill:  [place], [place] [phone]

## 2019-08-28 ENCOUNTER — Ambulatory Visit: Payer: Commercial Managed Care - PPO | Admitting: Family Medicine

## 2019-08-28 ENCOUNTER — Other Ambulatory Visit: Payer: Self-pay

## 2019-08-28 DIAGNOSIS — L02214 Cutaneous abscess of groin: Secondary | ICD-10-CM

## 2019-08-28 MED ORDER — CEFTRIAXONE SODIUM 1 G IJ SOLR
1.0000 g | Freq: Once | INTRAMUSCULAR | Status: AC
  Administered 2019-08-28: 1 g via INTRAMUSCULAR

## 2019-08-28 MED ORDER — AMOXICILLIN-POT CLAVULANATE 875-125 MG PO TABS: 1.0000 | ORAL_TABLET | Freq: Two times a day (BID) | ORAL | 0 refills | Status: DC

## 2019-08-28 NOTE — Patient Instructions (Addendum)
Referral to surgery placed.  Warm compresses to the area 3-4 times daily.  Start Augmentin tonight with supper.  Then take 1 tablet twice daily starting tomorrow.  If you develop any of the worrisome symptoms we discussed, ER!  Incision and Drainage, Care After This sheet gives you information about how to care for yourself after your procedure. Your health care provider may also give you more specific instructions. If you have problems or questions, contact your health care provider. What can I expect after the procedure? After the procedure, it is common to have:  Pain or discomfort around the incision site.  Blood, fluid, or pus (drainage) from the incision.  Redness and firm skin around the incision site. Follow these instructions at home: Medicines  Take over-the-counter and prescription medicines only as told by your health care provider.  If you were prescribed an antibiotic medicine, use or take it as told by your health care provider. Do not stop using the antibiotic even if you start to feel better. Wound care Follow instructions from your health care provider about how to take care of your wound. Make sure you:  Wash your hands with soap and water before and after you change your bandage (dressing). If soap and water are not available, use hand sanitizer.  Change your dressing and packing as told by your health care provider. ? If your dressing is dry or stuck when you try to remove it, moisten or wet the dressing with saline or water so that it can be removed without harming your skin or tissues. ? If your wound is packed, leave it in place until your health care provider tells you to remove it. To remove the packing, moisten or wet the packing with saline or water so that it can be removed without harming your skin or tissues.  Leave stitches (sutures), skin glue, or adhesive strips in place. These skin closures may need to stay in place for 2 weeks or longer. If adhesive  strip edges start to loosen and curl up, you may trim the loose edges. Do not remove adhesive strips completely unless your health care provider tells you to do that. Check your wound every day for signs of infection. Check for:  More redness, swelling, or pain.  More fluid or blood.  Warmth.  Pus or a bad smell. If you were sent home with a drain tube in place, follow instructions from your health care provider about:  How to empty it.  How to care for it at home.  General instructions  Rest the affected area.  Do not take baths, swim, or use a hot tub until your health care provider approves. Ask your health care provider if you may take showers. You may only be allowed to take sponge baths.  Return to your normal activities as told by your health care provider. Ask your health care provider what activities are safe for you. Your health care provider may put you on activity or lifting restrictions.  The incision will continue to drain. It is normal to have some clear or slightly bloody drainage. The amount of drainage should lessen each day.  Do not apply any creams, ointments, or liquids unless you have been told to by your health care provider.  Keep all follow-up visits as told by your health care provider. This is important. Contact a health care provider if:  Your cyst or abscess returns.  You have a fever or chills.  You have more redness, swelling,  or pain around your incision.  You have more fluid or blood coming from your incision.  Your incision feels warm to the touch.  You have pus or a bad smell coming from your incision.  You have red streaks above or below the incision site. Get help right away if:  You have severe pain or bleeding.  You cannot eat or drink without vomiting.  You have decreased urine output.  You become short of breath.  You have chest pain.  You cough up blood.  The affected area becomes numb or starts to tingle. These  symptoms may represent a serious problem that is an emergency. Do not wait to see if the symptoms will go away. Get medical help right away. Call your local emergency services (911 in the [place] Do not drive yourself to the hospital. Summary  After this procedure, it is common to have fluid, blood, or pus coming from the surgery site.  Follow all home care instructions. You will be told how to take care of your incision, how to check for infection, and how to take medicines.  If you were prescribed an antibiotic medicine, take it as told by your health care provider. Do not stop taking the antibiotic even if you start to feel better.  Contact a health care provider if you have increased redness, swelling, or pain around your incision. Get help right away if you have chest pain, you vomit, you cough up blood, or you have shortness of breath.  Keep all follow-up visits as told by your health care provider. This is important. This information is not intended to replace advice given to you by your health care provider. Make sure you discuss any questions you have with your health care provider. Document Revised: 12/31/2017 Document Reviewed: 12/31/2017 Elsevier Patient Education  2020 [employer].

## 2019-08-28 NOTE — Progress Notes (Signed)
Subjective: CC: groin pain PCP: Janora Norlander, DO SPQ:ZRAQTMA [place] is a 59 y.o. male presenting to clinic today for:  1. Groin lesion Patient reports onset about a week and a half ago.  The lesion that had started actually resolved but then recurred.  He notes that it is painful and seems to be growing in size.  He did not experience any drainage until this morning and this was only scant on his underwear.  He reports pain, inflammation.  He is not having any trouble urinating.  He has not been doing anything for the lesion open to this point.   ROS: Per HPI  No Known Allergies Past Medical History:  Diagnosis Date  . Allergy    occasionally   . CAD in native artery    a. 2008 - Single-vessel CAD with stent placement to the circumflex  b. 2012 - DES to Diag.  Scott. 06/2014 - RCA 25% stenosis, LAD 30% stenosis, ramus intermediate 45% stenosis, D1 95% stenosis treated with DES  d.01/2015 - nonobstructive CAD  . GERD (gastroesophageal reflux disease)   . Hyperlipidemia, mixed   . Hypertension   . Myocardial infarction ([place]) 2008  . Overweight(278.02)   . Sleep apnea    CPAP    Current Outpatient Medications:  .  amLODipine (NORVASC) 10 MG tablet, Take 1 tablet (10 mg total) by mouth daily., Disp: 90 tablet, Rfl: 3 .  aspirin 81 MG tablet, Take 81 mg by mouth daily.  , Disp: , Rfl:  .  atorvastatin (LIPITOR) 40 MG tablet, TAKE 1 TABLET BY MOUTH AT 6 IN THE EVENING, Disp: 90 tablet, Rfl: 3 .  buPROPion (WELLBUTRIN XL) 150 MG 24 hr tablet, Take 1 tablet (150 mg total) by mouth in the morning., Disp: 30 tablet, Rfl: 2 .  docusate sodium (COLACE) 100 MG capsule, Take 1 capsule (100 mg total) by mouth 2 (two) times daily., Disp: 60 capsule, Rfl: 2 .  hydrochlorothiazide (HYDRODIURIL) 25 MG tablet, Take 1 tablet (25 mg total) by mouth daily., Disp: 90 tablet, Rfl: 3 .  linaclotide (LINZESS) 290 MCG CAPS capsule, Take 1 capsule (290 mcg total) by mouth daily before breakfast., Disp:  4 capsule, Rfl: 0 .  lisinopril (ZESTRIL) 40 MG tablet, Take 1 tablet (40 mg total) by mouth daily., Disp: 90 tablet, Rfl: 3 .  metoprolol tartrate (LOPRESSOR) 50 MG tablet, Take 1 tablet (50 mg total) by mouth 2 (two) times daily., Disp: 180 tablet, Rfl: 3 .  nitroGLYCERIN (NITROSTAT) 0.4 MG SL tablet, Place 1 tablet (0.4 mg total) under the tongue every 5 (five) minutes as needed. May repeat for up to 3 doses., Disp: 25 tablet, Rfl: prn .  umeclidinium-vilanterol (ANORO ELLIPTA) 62.5-25 MCG/INH AEPB, Inhale 1 puff into the lungs daily., Disp: 1 each, Rfl: 0 Social History   Socioeconomic History  . Marital status: Married    Spouse name: Not on file  . Number of children: Not on file  . Years of education: Not on file  . Highest education level: Not on file  Occupational History  . Occupation: Full time    Employer: [employer]  Tobacco Use  . Smoking status: Former Smoker    Start date: 03/20/2012    Quit date: 01/30/2017    Years since quitting: 2.5  . Smokeless tobacco: Never Used  Vaping Use  . Vaping Use: Never used  Substance and Sexual Activity  . Alcohol use: Yes    Alcohol/week: 0.0 standard drinks  Comment: occ  . Drug use: No  . Sexual activity: Not on file  Other Topics Concern  . Not on file  Social History Narrative   Married   No regular exercise   Social Determinants of Health   Financial Resource Strain:   . Difficulty of Paying Living Expenses:   Food Insecurity:   . Worried About Charity fundraiser in the Last Year:   . Arboriculturist in the Last Year:   Transportation Needs:   . Film/video editor (Medical):   Marland Kitchen Lack of Transportation (Non-Medical):   Physical Activity:   . Days of Exercise per Week:   . Minutes of Exercise per Session:   Stress:   . Feeling of Stress :   Social Connections:   . Frequency of Communication with Friends and Family:   . Frequency of Social Gatherings with Friends and Family:   . Attends Religious  Services:   . Active Member of Clubs or Organizations:   . Attends Archivist Meetings:   Marland Kitchen Marital Status:   Intimate Partner Violence:   . Fear of Current or Ex-Partner:   . Emotionally Abused:   Marland Kitchen Physically Abused:   . Sexually Abused:    Family History  Problem Relation Age of Onset  . Heart disease Mother   . Colon polyps Father   . Coronary artery disease Other   . Colon polyps Sister   . Colon cancer Neg Hx   . Esophageal cancer Neg Hx   . Rectal cancer Neg Hx   . Stomach cancer Neg Hx     Objective: Physical Examination:  General: Awake, alert, appears uncomfortable GU: patient examined in supine position. There is a 1.25" x0.5" area of palpable induration, erythema and pain in the right inguinal area just inferior to the right testicle.  No testicular involvement.  Procedure Incision and Drainage: Denies allergy to lidocaine, epinephrine, iodine/Betadine.  Not on anticoagulation.  Informed consent provided.  Written consent obtained and scanned into the chart.  Appropriate timeout performed.  Indication : groin abscess .  Prep in the usual sterile fashion.  Area was cleaned x2 with rubbing alcohol.  Local anesthesia achieved with 3 mL of lidocaine withOUT epinephrine injected subcutaneously.  Area was cleaned with Betadine swab x3.  Area was tested for anesthesia.  A stab incision using a 15 blade was performed.  No purulent discharge from lesion obtained.  Area was milked but only able to express bloody discharge.  Less than 5  cc of blood loss.  Hemostasis achieved with pressure.  Area was cleaned, topical antibiotic applied, sterile dressing applied.  Patient tolerated procedure well.  Home care instructions were reviewed with the caregiver.  Handout was provided.  Assisted by Wilfred Curtis  Assessment/ Plan: 59 y.o. male   1. Groin abscess No significant drainage with incision and drainage.  I question deeper abscess.  Area was predominately indurated.  I  recommended that he use warm compresses in the area at least 3-4 times daily to promote drainage.  He was given a dose of Rocephin intramuscularly and placed on oral Augmentin.  Referral to general surgery placed as well.  Work note provided excusing until Monday was placed.  We will plan to follow-up on Tuesday afternoon for recheck.  He is aware of red flag signs and symptoms warranting further evaluation with emergency department. - Ambulatory referral to General Surgery - cefTRIAXone (ROCEPHIN) injection 1 g - amoxicillin-clavulanate (AUGMENTIN) 875-125 MG tablet; Take  1 tablet by mouth 2 (two) times daily.  Dispense: 20 tablet; Refill: 0   No orders of the defined types were placed in this encounter.  No orders of the defined types were placed in this encounter.    Janora Norlander, DO [place] [phone]

## 2019-09-02 ENCOUNTER — Other Ambulatory Visit: Payer: Self-pay

## 2019-09-02 ENCOUNTER — Ambulatory Visit: Payer: Commercial Managed Care - PPO | Admitting: Family Medicine

## 2019-09-02 ENCOUNTER — Ambulatory Visit: Payer: Commercial Managed Care - PPO | Admitting: General Surgery

## 2019-09-02 ENCOUNTER — Encounter: Payer: Self-pay | Admitting: General Surgery

## 2019-09-02 VITALS — BP 132/87 | HR 66 | Temp 98.3°F | Resp 14 | Ht 72.0 in | Wt 284.0 lb

## 2019-09-02 DIAGNOSIS — L02215 Cutaneous abscess of perineum: Secondary | ICD-10-CM | POA: Insufficient documentation

## 2019-09-02 NOTE — Progress Notes (Signed)
[facility] Note   HPI:  59 y.o. Male presents to clinic for evaluation of an abscess. He has previously been seen by me last year for a lipoma and a cyst on the right leg that had been infected. He comes in today due to an abscess that has drained in the perineal region. He says that he noticed swelling and a bulge in the area and then it resolved to only come back with worsening swelling and pain. This then proceeded to rupture and copious amounts of light red/ pink fluid drained from it. He was prescribed antibiotics by his PCP and sent here given the location and concern that this could be an infected cyst also.   He says that now the area is significantly less swollen but remains hard. He denies any further drainage. He has had no prior abscesses in the area and has had a colonoscopy. He denies any prior rectal/ anal abscesses.   Review of Systems:  Improving swelling and drainage Hardness/ induration in the perineal region  All other review of systems: otherwise negative   Vital Signs:  BP 132/87   Pulse 66   Temp 98.3 F (36.8 C) (Oral)   Resp 14   Ht 6' (1.829 m)   Wt 284 lb (128.8 kg)   SpO2 94%   BMI 38.52 kg/m    Physical Exam:  Physical Exam Vitals reviewed. Exam conducted with a chaperone present.  Cardiovascular:     Rate and Rhythm: Normal rate.  Pulmonary:     Effort: Pulmonary effort is normal.  Genitourinary:    Comments: Perineal region just under scrotal sac area of induration with signs of prior rupture with skin defect, no drainage and no erythema currently, tender with palpation  Musculoskeletal:     Comments: Right leg cyst from prior not noted on today's exam  Neurological:     Mental Status: He is alert.     Assessment:  59 y.o. yo Male with an infected perineal abscess. He has been on Augmentin and things are improving. The area has ruptured and does not have further contents to drain right now.  I am unsure if this could be  spontaneous abscess from likely infected hair follicle versus a cyst and at this time it is difficult to determine on physical exam due to the induration. Discussed risk of doing any surgical intervention now and risk of having an open wound that would need to be packed.   Plan:  - Complete antibiotics   - Follow up in a few weeks and will see if the induration has resolved or if we think this could be an infected cyst that will need to be removed to prevent recurrence  - Discussed him calling us if it flares again so we can renew antibiotics and see him sooner   Future Appointments  Date Time Provider [place]  10/07/2019  9:00 AM Virl Cagey, MD [facility] None     Curlene Labrum, MD [facility] [address] [place],  [id number] [phone] (office)

## 2019-09-02 NOTE — Patient Instructions (Signed)
Call if symptoms worsen. Take antibiotic. If you need a refill please call Lovey Newcomer and she will refill it and get you a sooner appt.   Skin Abscess  A skin abscess is an infected area on or under your skin that contains a collection of pus and other material. An abscess may also be called a furuncle, carbuncle, or boil. An abscess can occur in or on almost any part of your body. Some abscesses break open (rupture) on their own. Most continue to get worse unless they are treated. The infection can spread deeper into the body and eventually into your blood, which can make you feel ill. Treatment usually involves draining the abscess. What are the causes? An abscess occurs when germs, like bacteria, pass through your skin and cause an infection. This may be caused by:  A scrape or cut on your skin.  A puncture wound through your skin, including a needle injection or insect bite.  Blocked oil or sweat glands.  Blocked and infected hair follicles.  A cyst that forms beneath your skin (sebaceous cyst) and becomes infected. What increases the risk? This condition is more likely to develop in people who:  Have a weak body defense system (immune system).  Have diabetes.  Have dry and irritated skin.  Get frequent injections or use illegal IV drugs.  Have a foreign body in a wound, such as a splinter.  Have problems with their lymph system or veins. What are the signs or symptoms? Symptoms of this condition include:  A painful, firm bump under the skin.  A bump with pus at the top. This may break through the skin and drain. Other symptoms include:  Redness surrounding the abscess site.  Warmth.  Swelling of the lymph nodes (glands) near the abscess.  Tenderness.  A sore on the skin. How is this diagnosed? This condition may be diagnosed based on:  A physical exam.  Your medical history.  A sample of pus. This may be used to find out what is causing the infection.  Blood  tests.  Imaging tests, such as an ultrasound, CT scan, or MRI. How is this treated? A small abscess that drains on its own may not need treatment. Treatment for larger abscesses may include:  Moist heat or heat pack applied to the area several times a day.  A procedure to drain the abscess (incision and drainage).  Antibiotic medicines. For a severe abscess, you may first get antibiotics through an IV and then change to antibiotics by mouth. Follow these instructions at home: Medicines   Take over-the-counter and prescription medicines only as told by your health care provider.  If you were prescribed an antibiotic medicine, take it as told by your health care provider. Do not stop taking the antibiotic even if you start to feel better. Abscess care   If you have an abscess that has not drained, apply heat to the affected area. Use the heat source that your health care provider recommends, such as a moist heat pack or a heating pad. ? Place a towel between your skin and the heat source. ? Leave the heat on for 20-30 minutes. ? Remove the heat if your skin turns bright red. This is especially important if you are unable to feel pain, heat, or cold. You may have a greater risk of getting burned.  Follow instructions from your health care provider about how to take care of your abscess. Make sure you: ? Cover the abscess with a  bandage (dressing). ? Change your dressing or gauze as told by your health care provider. ? Wash your hands with soap and water before you change the dressing or gauze. If soap and water are not available, use hand sanitizer.  Check your abscess every day for signs of a worsening infection. Check for: ? More redness, swelling, or pain. ? More fluid or blood. ? Warmth. ? More pus or a bad smell. General instructions  To avoid spreading the infection: ? Do not share personal care items, towels, or hot tubs with others. ? Avoid making skin contact with other  people.  Keep all follow-up visits as told by your health care provider. This is important. Contact a health care provider if you have:  More redness, swelling, or pain around your abscess.  More fluid or blood coming from your abscess.  Warm skin around your abscess.  More pus or a bad smell coming from your abscess.  A fever.  Muscle aches.  Chills or a general ill feeling. Get help right away if you:  Have severe pain.  See red streaks on your skin spreading away from the abscess. Summary  A skin abscess is an infected area on or under your skin that contains a collection of pus and other material.  A small abscess that drains on its own may not need treatment.  Treatment for larger abscesses may include having a procedure to drain the abscess and taking an antibiotic. This information is not intended to replace advice given to you by your health care provider. Make sure you discuss any questions you have with your health care provider. Document Revised: 05/23/2018 Document Reviewed: 03/15/2017 Elsevier Patient Education  2020 [employer].

## 2019-09-07 ENCOUNTER — Other Ambulatory Visit: Payer: Self-pay | Admitting: Family Medicine

## 2019-09-07 DIAGNOSIS — L02214 Cutaneous abscess of groin: Secondary | ICD-10-CM

## 2019-09-15 ENCOUNTER — Ambulatory Visit (INDEPENDENT_AMBULATORY_CARE_PROVIDER_SITE_OTHER): Payer: Commercial Managed Care - PPO | Admitting: *Deleted

## 2019-09-15 ENCOUNTER — Other Ambulatory Visit: Payer: Self-pay | Admitting: Family Medicine

## 2019-09-15 ENCOUNTER — Other Ambulatory Visit: Payer: Self-pay

## 2019-09-15 DIAGNOSIS — L02214 Cutaneous abscess of groin: Secondary | ICD-10-CM

## 2019-09-15 DIAGNOSIS — L02215 Cutaneous abscess of perineum: Secondary | ICD-10-CM

## 2019-09-15 MED ORDER — CEFTRIAXONE SODIUM 1 G IJ SOLR
1.0000 g | Freq: Once | INTRAMUSCULAR | Status: AC
  Administered 2019-09-15: 1 g via INTRAMUSCULAR

## 2019-09-15 MED ORDER — AMOXICILLIN-POT CLAVULANATE 875-125 MG PO TABS: 1.0000 | ORAL_TABLET | Freq: Two times a day (BID) | ORAL | 0 refills | Status: DC

## 2019-09-15 NOTE — Progress Notes (Signed)
Rocephin 1 gm given and tolerated well.

## 2019-09-15 NOTE — Patient Instructions (Signed)
Ceftriaxone Injection What is this medicine? CEFTRIAXONE (sef try AX one) is a cephalosporin antibiotic. It treats some infections caused by bacteria. It will not work for colds, the flu, or other viruses. This medicine may be used for other purposes; ask your health care provider or pharmacist if you have questions. COMMON BRAND NAME(S): Ceftrisol Plus, Rocephin What should I tell my health care provider before I take this medicine? They need to know if you have any of these conditions:  any chronic illness  bowel disease, like colitis  both kidney and liver disease  high bilirubin level in newborn patients  an unusual or allergic reaction to ceftriaxone, other cephalosporin or penicillin antibiotics, foods, dyes, or preservatives  pregnant or trying to get pregnant  breast-feeding How should I use this medicine? This drug is injected into a muscle or a vein. It is usually given by a health care provider in a hospital or clinic setting. If you get this drug at home, you will be taught how to prepare and give it. Use exactly as directed. Take it as directed on the prescription label at the same time every day. Keep taking it unless your health care provider tells you to stop. It is important that you put your used needles and syringes in a special sharps container. Do not put them in a trash can. If you do not have a sharps container, call your pharmacist or health care provider to get one. Talk to your health care provider about the use of this drug in children. While it may be prescribed for children as young as newborns for selected conditions, precautions do apply. Overdosage: If you think you have taken too much of this medicine contact a poison control center or emergency room at once. NOTE: This medicine is only for you. Do not share this medicine with others. What if I miss a dose? It is important not to miss your dose. Call your health care provider if you are unable to keep an  appointment. If you give yourself this drug at home and you miss a dose, take it as soon as you can. If it is almost time for your next dose, take only that dose. Do not take double or extra doses. What may interact with this medicine? Do not take this medicine with any of the following medications:  intravenous calcium This medicine may also interact with the following medications:  birth control pills This list may not describe all possible interactions. Give your health care provider a list of all the medicines, herbs, non-prescription drugs, or dietary supplements you use. Also tell them if you smoke, drink alcohol, or use illegal drugs. Some items may interact with your medicine. What should I watch for while using this medicine? Tell your doctor or health care provider if your symptoms do not improve or if they get worse. This medicine may cause serious skin reactions. They can happen weeks to months after starting the medicine. Contact your health care provider right away if you notice fevers or flu-like symptoms with a rash. The rash may be red or purple and then turn into blisters or peeling of the skin. Or, you might notice a red rash with swelling of the face, lips or lymph nodes in your neck or under your arms. Do not treat diarrhea with over the counter products. Contact your doctor if you have diarrhea that lasts more than 2 days or if it is severe and watery. If you are being treated for   a sexually transmitted disease, avoid sexual contact until you have finished your treatment. Having sex can infect your sexual partner. Calcium may bind to this medicine and cause lung or kidney problems. Avoid calcium products while taking this medicine and for 48 hours after taking the last dose of this medicine. What side effects may I notice from receiving this medicine? Side effects that you should report to your doctor or health care professional as soon as possible:  allergic reactions like  skin rash, itching or hives, swelling of the face, lips, or tongue  breathing problems  fever, chills  irregular heartbeat  pain when passing urine  redness, blistering, peeling, or loosening of the skin, including inside the mouth  seizures  stomach pain, cramps  unusual bleeding, bruising  unusually weak or tired Side effects that usually do not require medical attention (report to your doctor or health care professional if they continue or are bothersome):  diarrhea  dizzy, drowsy  headache  nausea, vomiting  pain, swelling, irritation where injected  stomach upset  sweating This list may not describe all possible side effects. Call your doctor for medical advice about side effects. You may report side effects to FDA at 1-800-FDA-1088. Where should I keep my medicine? Keep out of the reach of children and pets. You will be instructed on how to store this drug. Protect from light. Throw away any unused drug after the expiration date. NOTE: This sheet is a summary. It may not cover all possible information. If you have questions about this medicine, talk to your doctor, pharmacist, or health care provider.  2020 Elsevier/Gold Standard (2018-09-05 18:29:21)  

## 2019-10-07 ENCOUNTER — Ambulatory Visit: Payer: Commercial Managed Care - PPO | Admitting: General Surgery

## 2019-10-07 ENCOUNTER — Other Ambulatory Visit: Payer: Self-pay

## 2019-10-07 ENCOUNTER — Encounter: Payer: Self-pay | Admitting: General Surgery

## 2019-10-07 VITALS — BP 146/88 | HR 71 | Temp 98.0°F | Resp 12 | Ht 72.0 in | Wt 286.0 lb

## 2019-10-07 DIAGNOSIS — L723 Sebaceous cyst: Secondary | ICD-10-CM

## 2019-10-07 DIAGNOSIS — L02215 Cutaneous abscess of perineum: Secondary | ICD-10-CM | POA: Diagnosis not present

## 2019-10-07 MED ORDER — AMOXICILLIN-POT CLAVULANATE 875-125 MG PO TABS: 1.0000 | ORAL_TABLET | Freq: Two times a day (BID) | ORAL | 0 refills | Status: AC

## 2019-10-07 NOTE — Progress Notes (Signed)
[facility] History and Physical   Chief Complaint    Follow-up      Scott Hatfield is a 59 y.o. male.  HPI:  Mr. Ausburn returns for his perineal infected cyst and the sebaceous cyst on the right thigh. He says that the area in the perineal region resolved and then became swollen and inflamed again a week ago and required more antibiotics from his PCP.  It is now back down in size. The right thigh cyst has become more hardened and swollen compared to his prior visit. He has had issues with both of these areas getting inflamed and infected and expressing white contents from them in the past. He is ready to do something to prevent this from recurring.   He has a history of a stent placed in 2008 and 2012 and 2016. He denies any current chest pain or SOB with activity.  He had a stress test August 2018 that was negative for ischemia.  He follows with Dr. Percival Spanish annually.    Past Medical History:  Diagnosis Date  . Allergy    occasionally   . CAD in native artery    a. 2008 - Single-vessel CAD with stent placement to the circumflex  b. 2012 - DES to Diag.  c. 06/2014 - RCA 25% stenosis, LAD 30% stenosis, ramus intermediate 45% stenosis, D1 95% stenosis treated with DES  d.01/2015 - nonobstructive CAD  . GERD (gastroesophageal reflux disease)   . Hyperlipidemia, mixed   . Hypertension   . Myocardial infarction ([place]) 2008  . Overweight(278.02)   . Sleep apnea    CPAP    Past Surgical History:  Procedure Laterality Date  . CARDIAC CATHETERIZATION N/A 07/03/2014   Procedure: Left Heart Cath and Coronary Angiography;  Surgeon: Peter M Martinique, MD;  Location: [facility];  Service: Cardiovascular;  Laterality: N/A;  . CARDIAC CATHETERIZATION N/A 07/03/2014   Procedure: Coronary Stent Intervention;  Surgeon: Peter M Martinique, MD;  Location: [facility];  Service: Cardiovascular;  Laterality: N/A;  . CARDIAC CATHETERIZATION N/A 01/26/2015   Procedure: Left  Heart Cath and Coronary Angiography;  Surgeon: Peter M Martinique, MD;  Location: [facility];  Service: Cardiovascular;  Laterality: N/A;  . CORONARY STENT PLACEMENT  2008, 2012  . wrist  surgery      Family History  Problem Relation Age of Onset  . Heart disease Mother   . Colon polyps Father   . Coronary artery disease Other   . Colon polyps Sister   . Colon cancer Neg Hx   . Esophageal cancer Neg Hx   . Rectal cancer Neg Hx   . Stomach cancer Neg Hx     Social History   Tobacco Use  . Smoking status: Former Smoker    Start date: 03/20/2012    Quit date: 01/30/2017    Years since quitting: 2.6  . Smokeless tobacco: Never Used  Vaping Use  . Vaping Use: Never used  Substance Use Topics  . Alcohol use: Yes    Alcohol/week: 0.0 standard drinks    Comment: occ  . Drug use: No    Medications: I have reviewed the patient's current medications. Allergies as of 10/07/2019   No Known Allergies     Medication List       Accurate as of October 07, 2019  9:27 AM. If you have any questions, ask your nurse or doctor.        STOP taking these  medications   amoxicillin-clavulanate 875-125 MG tablet Commonly known as: AUGMENTIN Stopped by: Virl Cagey, MD     TAKE these medications   amLODipine 10 MG tablet Commonly known as: NORVASC Take 1 tablet (10 mg total) by mouth daily.   Anoro Ellipta 62.5-25 MCG/INH Aepb Generic drug: umeclidinium-vilanterol Inhale 1 puff into the lungs daily.   aspirin 81 MG tablet Take 81 mg by mouth daily.   atorvastatin 40 MG tablet Commonly known as: LIPITOR TAKE 1 TABLET BY MOUTH AT 6 IN THE EVENING   buPROPion 150 MG 24 hr tablet Commonly known as: Wellbutrin XL Take 1 tablet (150 mg total) by mouth in the morning.   docusate sodium 100 MG capsule Commonly known as: Colace Take 1 capsule (100 mg total) by mouth 2 (two) times daily.   hydrochlorothiazide 25 MG tablet Commonly known as: HYDRODIURIL Take 1 tablet (25  mg total) by mouth daily.   linaclotide 290 MCG Caps capsule Commonly known as: Linzess Take 1 capsule (290 mcg total) by mouth daily before breakfast.   lisinopril 40 MG tablet Commonly known as: ZESTRIL Take 1 tablet (40 mg total) by mouth daily.   metoprolol tartrate 50 MG tablet Commonly known as: LOPRESSOR Take 1 tablet (50 mg total) by mouth 2 (two) times daily.   nitroGLYCERIN 0.4 MG SL tablet Commonly known as: NITROSTAT Place 1 tablet (0.4 mg total) under the tongue every 5 (five) minutes as needed. May repeat for up to 3 doses.        ROS:  A comprehensive review of systems was negative except for: pain and swelling of the perineal region from infection, improved now  Blood pressure (!) 146/88, pulse 71, temperature 98 F (36.7 C), temperature source Oral, resp. rate 12, height 6' (1.829 m), weight 286 lb (129.7 kg), SpO2 95 %. Physical Exam Vitals reviewed.  Constitutional:      Appearance: He is obese.  HENT:     Head: Normocephalic.     Nose: Nose normal.     Mouth/Throat:     Mouth: Mucous membranes are moist.  Eyes:     Extraocular Movements: Extraocular movements intact.     Pupils: Pupils are equal, round, and reactive to light.  Cardiovascular:     Rate and Rhythm: Normal rate and regular rhythm.  Pulmonary:     Effort: Pulmonary effort is normal.     Breath sounds: Normal breath sounds.  Abdominal:     General: There is no distension.     Palpations: Abdomen is soft.     Tenderness: There is no abdominal tenderness.  Genitourinary:    Comments: Perineal cyst with about 1 cm induration now, no erythema or drainage; right thigh 2cm indurated area, no erythema, both tender Musculoskeletal:        General: No swelling. Normal range of motion.     Cervical back: Normal range of motion.  Skin:    General: Skin is warm and dry.  Neurological:     General: No focal deficit present.     Mental Status: He is alert and oriented to person, place, and  time.  Psychiatric:        Mood and Affect: Mood normal.        Behavior: Behavior normal.        Thought Content: Thought content normal.        Judgment: Judgment normal.    Results: None   Assessment & Plan:  NARAYAN SCULL is a 59  y.o. male with repeatedly infected areas in the right thigh and perineal area that appear to be most consistent with sebaceous cyst given the chronicity and recurrence. He has no evidence of any perianal origin for the perineal area.  He has had to have multiple rounds of antibiotics for these areas when they become swollen and painful.    -Augmentin for the next week leading up to surgery given the induration and swelling in the right thigh to prevent any infection that may be brewing  -Excision of perineal and right thigh cyst  -Discussed that this will require lithotomy position and will let anesthesia decide about type of anesthesia. He has been stable on his cardiac medication and has no angina symptoms. I do not think he needs cardiac risk stratification at this time.  -Discussed preop COVID testing.   All questions were answered to the satisfaction of the patient.   Virl Cagey 10/07/2019, 9:27 AM

## 2019-10-07 NOTE — Patient Instructions (Addendum)
Epidermal (Sebaceous ) Cyst Removal  Epidermal cyst removal is a procedure to remove a sac of oily material (keratin) that has formed under your skin (epidermal cyst). Epidermal cysts may also be called epidermoid cysts, sebaceous cysts, or keratin cysts. Normally, the skin secretes this oily material through a gland or a hair follicle. However, when a skin gland or hair follicle becomes blocked, an epidermal cyst can form. You may need this procedure if you have an epidermal cyst that becomes large, uncomfortable, or infected. Tell a health care provider about:  Any allergies you have.  All medicines you are taking, including vitamins, herbs, eye drops, creams, and over-the-counter medicines.  Any problems you or family members have had with anesthetic medicines.  Any blood disorders you have.  Any surgeries you have had.  Any medical conditions you have now or have had.  Whether you are pregnant or may be pregnant. What are the risks? Generally, this is a safe procedure. However, problems may occur, including:  Development of another cyst.  Bleeding.  Infection.  Scarring. What happens before the procedure?  Ask your health care provider about: ? Changing or stopping your regular medicines. This is especially important if you are taking diabetes medicines or blood thinners. ? Taking medicines such as aspirin and ibuprofen. These medicines can thin your blood. Do not take these medicines unless your health care provider tells you to take them. ? Taking over-the-counter medicines, vitamins, herbs, and supplements.  If you have an infected cyst, you may have to take antibiotic medicine before the cyst removal. Take your antibiotic as told by your health care provider. Do not stop taking the antibiotic even if you start to feel better.  Take a shower on the morning of your procedure. Your health care provider may ask you to use a germ-killing soap. What happens during the  procedure?   You will be given a medicine to numb the area (local anesthetic).  The skin around the cyst will be cleaned with a germ-killing solution.  The health care provider will make a small incision in your skin over the cyst.  The health care provider will separate the cyst from the surrounding tissues that are under your skin.  If possible, the cyst will be removed undamaged (intact).  If the cyst bursts (ruptures), it will be removed in pieces.  After the cyst is removed, the health care provider will control any bleeding and close the incision with small stitches (sutures). Small incisions may not need sutures, and the bleeding will be controlled by applying direct pressure with gauze.  The health care provider may apply antibiotic ointment and a bandage (dressing) over the incision. The procedure may vary among health care providers and hospitals. What happens after the procedure?  If your cyst ruptured during the procedure, you may need an antibiotic. If you are prescribed an antibiotic medicine or ointment, take or apply it as told by your health care provider. Do not stop using the antibiotic even if you start to feel better. Summary  Epidermal cyst removal is a procedure to remove a sac of oily material (keratin) that has formed under your skin (epidermal cyst).  You may need this procedure if you have an epidermal cyst that becomes large, uncomfortable, or infected.  The health care provider will make a small incision in your skin to remove the cyst.  If you are prescribed an antibiotic medicine before the procedure, after the procedure, or both, use the antibiotic as told  by your health care provider. Do not stop using the antibiotic even if you start to feel better. This information is not intended to replace advice given to you by your health care provider. Make sure you discuss any questions you have with your health care provider. Document Revised: 05/23/2018  Document Reviewed: 11/23/2016 Elsevier Patient Education  [place].

## 2019-10-08 NOTE — H&P (Signed)
Scott Hatfield is a 59 y.o. male.  HPI:  Scott Hatfield returns for his perineal infected cyst and the sebaceous cyst on the right thigh. He says that the area in the perineal region resolved and then became swollen and inflamed again a week ago and required more antibiotics from his PCP.  It is now back down in size. The right thigh cyst has become more hardened and swollen compared to his prior visit. He has had issues with both of these areas getting inflamed and infected and expressing white contents from them in the past. He is ready to do something to prevent this from recurring.   He has a history of a stent placed in 2008 and 2012 and 2016. He denies any current chest pain or SOB with activity.  He had a stress test August 2018 that was negative for ischemia.  He follows with Dr. Percival Spanish annually.    Past Medical History:  Diagnosis Date  . Allergy    occasionally   . CAD in native artery    a. 2008 - Single-vessel CAD with stent placement to the circumflex  b. 2012 - DES to Diag.  c. 06/2014 - RCA 25% stenosis, LAD 30% stenosis, ramus intermediate 45% stenosis, D1 95% stenosis treated with DES  d.01/2015 - nonobstructive CAD  . GERD (gastroesophageal reflux disease)   . Hyperlipidemia, mixed   . Hypertension   . Myocardial infarction ([place]) 2008  . Overweight(278.02)   . Sleep apnea    CPAP    Past Surgical History:  Procedure Laterality Date  . CARDIAC CATHETERIZATION N/A 07/03/2014   Procedure: Left Heart Cath and Coronary Angiography;  Surgeon: Peter M Martinique, MD;  Location: [facility];  Service: Cardiovascular;  Laterality: N/A;  . CARDIAC CATHETERIZATION N/A 07/03/2014   Procedure: Coronary Stent Intervention;  Surgeon: Peter M Martinique, MD;  Location: [facility];  Service: Cardiovascular;  Laterality: N/A;  . CARDIAC CATHETERIZATION N/A 01/26/2015   Procedure: Left  Heart Cath and Coronary Angiography;  Surgeon: Peter M Martinique, MD;  Location: [facility];  Service: Cardiovascular;  Laterality: N/A;  . CORONARY STENT PLACEMENT  2008, 2012  . wrist  surgery      Family History  Problem Relation Age of Onset  . Heart disease Mother   . Colon polyps Father   . Coronary artery disease Other   . Colon polyps Sister   . Colon cancer Neg Hx   . Esophageal cancer Neg Hx   . Rectal cancer Neg Hx   . Stomach cancer Neg Hx     Social History   Tobacco Use  . Smoking status: Former Smoker    Start date: 03/20/2012    Quit date: 01/30/2017    Years since quitting: 2.6  . Smokeless tobacco: Never Used  Vaping Use  . Vaping Use: Never used  Substance Use Topics  . Alcohol use: Yes    Alcohol/week: 0.0 standard drinks    Comment: occ  . Drug use: No    Medications: I have reviewed the patient's current medications. Allergies as of 10/07/2019   No Known Allergies     Medication List       Accurate as of October 07, 2019  9:27 AM. If you have any questions, ask your nurse or doctor.        STOP taking these  medications   amoxicillin-clavulanate 875-125 MG tablet Commonly known as: AUGMENTIN Stopped by: Virl Cagey, MD     TAKE these medications   amLODipine 10 MG tablet Commonly known as: NORVASC Take 1 tablet (10 mg total) by mouth daily.   Anoro Ellipta 62.5-25 MCG/INH Aepb Generic drug: umeclidinium-vilanterol Inhale 1 puff into the lungs daily.   aspirin 81 MG tablet Take 81 mg by mouth daily.   atorvastatin 40 MG tablet Commonly known as: LIPITOR TAKE 1 TABLET BY MOUTH AT 6 IN THE EVENING   buPROPion 150 MG 24 hr tablet Commonly known as: Wellbutrin XL Take 1 tablet (150 mg total) by mouth in the morning.   docusate sodium 100 MG capsule Commonly known as: Colace Take 1 capsule (100 mg total) by mouth 2 (two) times daily.   hydrochlorothiazide 25 MG tablet Commonly known as: HYDRODIURIL Take 1 tablet (25  mg total) by mouth daily.   linaclotide 290 MCG Caps capsule Commonly known as: Linzess Take 1 capsule (290 mcg total) by mouth daily before breakfast.   lisinopril 40 MG tablet Commonly known as: ZESTRIL Take 1 tablet (40 mg total) by mouth daily.   metoprolol tartrate 50 MG tablet Commonly known as: LOPRESSOR Take 1 tablet (50 mg total) by mouth 2 (two) times daily.   nitroGLYCERIN 0.4 MG SL tablet Commonly known as: NITROSTAT Place 1 tablet (0.4 mg total) under the tongue every 5 (five) minutes as needed. May repeat for up to 3 doses.        ROS:  A comprehensive review of systems was negative except for: pain and swelling of the perineal region from infection, improved now  Blood pressure (!) 146/88, pulse 71, temperature 98 F (36.7 C), temperature source Oral, resp. rate 12, height 6' (1.829 m), weight 286 lb (129.7 kg), SpO2 95 %. Physical Exam Vitals reviewed.  Constitutional:      Appearance: He is obese.  HENT:     Head: Normocephalic.     Nose: Nose normal.     Mouth/Throat:     Mouth: Mucous membranes are moist.  Eyes:     Extraocular Movements: Extraocular movements intact.     Pupils: Pupils are equal, round, and reactive to light.  Cardiovascular:     Rate and Rhythm: Normal rate and regular rhythm.  Pulmonary:     Effort: Pulmonary effort is normal.     Breath sounds: Normal breath sounds.  Abdominal:     General: There is no distension.     Palpations: Abdomen is soft.     Tenderness: There is no abdominal tenderness.  Genitourinary:    Comments: Perineal cyst with about 1 cm induration now, no erythema or drainage; right thigh 2cm indurated area, no erythema, both tender Musculoskeletal:        General: No swelling. Normal range of motion.     Cervical back: Normal range of motion.  Skin:    General: Skin is warm and dry.  Neurological:     General: No focal deficit present.     Mental Status: He is alert and oriented to person, place, and  time.  Psychiatric:        Mood and Affect: Mood normal.        Behavior: Behavior normal.        Thought Content: Thought content normal.        Judgment: Judgment normal.    Results: None   Assessment & Plan:  Scott Hatfield is a 59  y.o. male with repeatedly infected areas in the right thigh and perineal area that appear to be most consistent with sebaceous cyst given the chronicity and recurrence. He has no evidence of any perianal origin for the perineal area.  He has had to have multiple rounds of antibiotics for these areas when they become swollen and painful.    -Augmentin for the next week leading up to surgery given the induration and swelling in the right thigh to prevent any infection that may be brewing  -Excision of perineal and right thigh cyst  -Discussed that this will require lithotomy position and will let anesthesia decide about type of anesthesia. He has been stable on his cardiac medication and has no angina symptoms. I do not think he needs cardiac risk stratification at this time.  -Discussed preop COVID testing.   All questions were answered to the satisfaction of the patient.   Virl Cagey 10/07/2019, 9:27 AM

## 2019-10-15 ENCOUNTER — Encounter (HOSPITAL_COMMUNITY): Payer: Commercial Managed Care - PPO

## 2019-10-15 ENCOUNTER — Other Ambulatory Visit (HOSPITAL_COMMUNITY): Payer: Commercial Managed Care - PPO

## 2019-10-16 NOTE — Patient Instructions (Signed)
Scott Hatfield  10/16/2019     @PREFPERIOPPHARMACY @   Your procedure is scheduled on   10/24/2019  Report to [facility] at  [place].M.  Call this number if you have problems the morning of surgery:  [phone]   Remember:  Do not eat or drink after midnight.                          Take these medicines the morning of surgery with A SIP OF WATER  Amlodipine, wellbutrin, metoprolol.    Do not wear jewelry, make-up or nail polish.  Do not wear lotions, powders, or perfumes. Please wear deodorant and brush your teeth.  Do not shave 48 hours prior to surgery.  Men may shave face and neck.  Do not bring valuables to the hospital.  [facility] is not responsible for any belongings or valuables.  Contacts, dentures or bridgework may not be worn into surgery.  Leave your suitcase in the car.  After surgery it may be brought to your room.  For patients admitted to the hospital, discharge time will be determined by your treatment team.  Patients discharged the day of surgery will not be allowed to drive home.   Name and phone number of your driver:   family Special instructions:   DO NOT smoke the morning of your procedure.  Please read over the following fact sheets that you were given. Anesthesia Post-op Instructions and Care and Recovery After Surgery       Epidermal Cyst Removal, Care After This sheet gives you information about how to care for yourself after your procedure. Your health care provider may also give you more specific instructions. If you have problems or questions, contact your health care provider. What can I expect after the procedure? After the procedure, it is common to have:  Soreness in the area where your cyst was removed.  Tightness or itchiness from the stitches (sutures) in your skin. Follow these instructions at home: Medicines  Take over-the-counter and prescription medicines only as told by your health care provider.  If you  were prescribed an antibiotic medicine or ointment, take or apply it as told by your health care provider. Do not stop using the antibiotic even if you start to feel better. Incision care   Follow instructions from your health care provider about how to take care of your incision. Make sure you: ? Wash your hands with soap and water before you change your bandage (dressing). If soap and water are not available, use hand sanitizer. ? Change your dressing as told by your health care provider. ? Leave sutures, skin glue, or adhesive strips in place. These skin closures may need to stay in place for 1-2 weeks or longer. If adhesive strip edges start to loosen and curl up, you may trim the loose edges. Do not remove adhesive strips completely unless your health care provider tells you to do that.  Keep the dressingdry until your health care provider says that it can be removed.  After your dressing is off, check your incision area every day for signs of infection. Check for: ? Redness, swelling, or pain. ? Fluid or blood. ? Warmth. ? Pus or a bad smell. General instructions  Do not take baths, swim, or use a hot tub until your health care provider approves. Ask your health care provider if you may take showers. You may  only be allowed to take sponge baths.  Your health care provider may ask you to avoid contact sports or activities that take a lot of effort. Do not do anything that stretches or puts pressure on your incision.  You can return to your normal diet.  Keep all follow-up visits as told by your health care provider. This is important. Contact a health care provider if:  You have a fever.  You have redness, swelling, or pain in the incision area.  You have fluid or blood coming from your incision.  You have pus or a bad smell coming from your incision.  Your incision feels warm to the touch.  Your cyst grows back. Summary  After the procedure, it is common to have  soreness in the area where your cyst was removed.  Take or apply over-the-counter and prescription medicines only as told by your health care provider.  Follow instructions from your health care provider about how to take care of your incision. This information is not intended to replace advice given to you by your health care provider. Make sure you discuss any questions you have with your health care provider. Document Revised: 05/22/2017 Document Reviewed: 11/23/2016 Elsevier Patient Education  2020 [place] After These instructions provide you with information about caring for yourself after your procedure. Your health care provider may also give you more specific instructions. Your treatment has been planned according to current medical practices, but problems sometimes occur. Call your health care provider if you have any problems or questions after your procedure. What can I expect after the procedure? After your procedure, you may:  Feel sleepy for several hours.  Feel clumsy and have poor balance for several hours.  Feel forgetful about what happened after the procedure.  Have poor judgment for several hours.  Feel nauseous or vomit.  Have a sore throat if you had a breathing tube during the procedure. Follow these instructions at home: For at least 24 hours after the procedure:      Have a responsible adult stay with you. It is important to have someone help care for you until you are awake and alert.  Rest as needed.  Do not: ? Participate in activities in which you could fall or become injured. ? Drive. ? Use heavy machinery. ? Drink alcohol. ? Take sleeping pills or medicines that cause drowsiness. ? Make important decisions or sign legal documents. ? Take care of children on your own. Eating and drinking  Follow the diet that is recommended by your health care provider.  If you vomit, drink water, juice, or soup when  you can drink without vomiting.  Make sure you have little or no nausea before eating solid foods. General instructions  Take over-the-counter and prescription medicines only as told by your health care provider.  If you have sleep apnea, surgery and certain medicines can increase your risk for breathing problems. Follow instructions from your health care provider about wearing your sleep device: ? Anytime you are sleeping, including during daytime naps. ? While taking prescription pain medicines, sleeping medicines, or medicines that make you drowsy.  If you smoke, do not smoke without supervision.  Keep all follow-up visits as told by your health care provider. This is important. Contact a health care provider if:  You keep feeling nauseous or you keep vomiting.  You feel light-headed.  You develop a rash.  You have a fever. Get help right away if:  You have trouble breathing. Summary  For several hours after your procedure, you may feel sleepy and have poor judgment.  Have a responsible adult stay with you for at least 24 hours or until you are awake and alert. This information is not intended to replace advice given to you by your health care provider. Make sure you discuss any questions you have with your health care provider. Document Revised: 04/30/2017 Document Reviewed: 05/23/2015 Elsevier Patient Education  [place]. How to Use Chlorhexidine for Bathing Chlorhexidine gluconate (CHG) is a germ-killing (antiseptic) solution that is used to clean the skin. It can get rid of the bacteria that normally live on the skin and can keep them away for about 24 hours. To clean your skin with CHG, you may be given:  A CHG solution to use in the shower or as part of a sponge bath.  A prepackaged cloth that contains CHG. Cleaning your skin with CHG may help lower the risk for infection:  While you are staying in the intensive care unit of the hospital.  If you have a  vascular access, such as a central line, to provide short-term or long-term access to your veins.  If you have a catheter to drain urine from your bladder.  If you are on a ventilator. A ventilator is a machine that helps you breathe by moving air in and out of your lungs.  After surgery. What are the risks? Risks of using CHG include:  A skin reaction.  Hearing loss, if CHG gets in your ears.  Eye injury, if CHG gets in your eyes and is not rinsed out.  The CHG product catching fire. Make sure that you avoid smoking and flames after applying CHG to your skin. Do not use CHG:  If you have a chlorhexidine allergy or have previously reacted to chlorhexidine.  On babies younger than 44 months of age. How to use CHG solution  Use CHG only as told by your health care provider, and follow the instructions on the label.  Use the full amount of CHG as directed. Usually, this is one bottle. During a shower Follow these steps when using CHG solution during a shower (unless your health care provider gives you different instructions): 1. Start the shower. 2. Use your normal soap and shampoo to wash your face and hair. 3. Turn off the shower or move out of the shower stream. 4. Pour the CHG onto a clean washcloth. Do not use any type of brush or rough-edged sponge. 5. Starting at your neck, lather your body down to your toes. Make sure you follow these instructions: ? If you will be having surgery, pay special attention to the part of your body where you will be having surgery. Scrub this area for at least 1 minute. ? Do not use CHG on your head or face. If the solution gets into your ears or eyes, rinse them well with water. ? Avoid your genital area. ? Avoid any areas of skin that have broken skin, cuts, or scrapes. ? Scrub your back and under your arms. Make sure to wash skin folds. 6. Let the lather sit on your skin for 1-2 minutes or as long as told by your health care  provider. 7. Thoroughly rinse your entire body in the shower. Make sure that all body creases and crevices are rinsed well. 8. Dry off with a clean towel. Do not put any substances on your body afterward--such as powder, lotion, or perfume--unless  you are told to do so by your health care provider. Only use lotions that are recommended by the manufacturer. 9. Put on clean clothes or pajamas. 10. If it is the night before your surgery, sleep in clean sheets.  During a sponge bath Follow these steps when using CHG solution during a sponge bath (unless your health care provider gives you different instructions): 1. Use your normal soap and shampoo to wash your face and hair. 2. Pour the CHG onto a clean washcloth. 3. Starting at your neck, lather your body down to your toes. Make sure you follow these instructions: ? If you will be having surgery, pay special attention to the part of your body where you will be having surgery. Scrub this area for at least 1 minute. ? Do not use CHG on your head or face. If the solution gets into your ears or eyes, rinse them well with water. ? Avoid your genital area. ? Avoid any areas of skin that have broken skin, cuts, or scrapes. ? Scrub your back and under your arms. Make sure to wash skin folds. 4. Let the lather sit on your skin for 1-2 minutes or as long as told by your health care provider. 5. Using a different clean, wet washcloth, thoroughly rinse your entire body. Make sure that all body creases and crevices are rinsed well. 6. Dry off with a clean towel. Do not put any substances on your body afterward--such as powder, lotion, or perfume--unless you are told to do so by your health care provider. Only use lotions that are recommended by the manufacturer. 7. Put on clean clothes or pajamas. 8. If it is the night before your surgery, sleep in clean sheets. How to use CHG prepackaged cloths  Only use CHG cloths as told by your health care provider, and  follow the instructions on the label.  Use the CHG cloth on clean, dry skin.  Do not use the CHG cloth on your head or face unless your health care provider tells you to.  When washing with the CHG cloth: ? Avoid your genital area. ? Avoid any areas of skin that have broken skin, cuts, or scrapes. Before surgery Follow these steps when using a CHG cloth to clean before surgery (unless your health care provider gives you different instructions): 1. Using the CHG cloth, vigorously scrub the part of your body where you will be having surgery. Scrub using a back-and-forth motion for 3 minutes. The area on your body should be completely wet with CHG when you are done scrubbing. 2. Do not rinse. Discard the cloth and let the area air-dry. Do not put any substances on the area afterward, such as powder, lotion, or perfume. 3. Put on clean clothes or pajamas. 4. If it is the night before your surgery, sleep in clean sheets.  For general bathing Follow these steps when using CHG cloths for general bathing (unless your health care provider gives you different instructions). 1. Use a separate CHG cloth for each area of your body. Make sure you wash between any folds of skin and between your fingers and toes. Wash your body in the following order, switching to a new cloth after each step: ? The front of your neck, shoulders, and chest. ? Both of your arms, under your arms, and your hands. ? Your stomach and groin area, avoiding the genitals. ? Your right leg and foot. ? Your left leg and foot. ? The back of your neck, your  back, and your buttocks. 2. Do not rinse. Discard the cloth and let the area air-dry. Do not put any substances on your body afterward--such as powder, lotion, or perfume--unless you are told to do so by your health care provider. Only use lotions that are recommended by the manufacturer. 3. Put on clean clothes or pajamas. Contact a health care provider if:  Your skin gets  irritated after scrubbing.  You have questions about using your solution or cloth. Get help right away if:  Your eyes become very red or swollen.  Your eyes itch badly.  Your skin itches badly and is red or swollen.  Your hearing changes.  You have trouble seeing.  You have swelling or tingling in your mouth or throat.  You have trouble breathing.  You swallow any chlorhexidine. Summary  Chlorhexidine gluconate (CHG) is a germ-killing (antiseptic) solution that is used to clean the skin. Cleaning your skin with CHG may help to lower your risk for infection.  You may be given CHG to use for bathing. It may be in a bottle or in a prepackaged cloth to use on your skin. Carefully follow your health care provider's instructions and the instructions on the product label.  Do not use CHG if you have a chlorhexidine allergy.  Contact your health care provider if your skin gets irritated after scrubbing. This information is not intended to replace advice given to you by your health care provider. Make sure you discuss any questions you have with your health care provider. Document Revised: 04/18/2018 Document Reviewed: 12/28/2016 Elsevier Patient Education  [place].

## 2019-10-22 ENCOUNTER — Other Ambulatory Visit (HOSPITAL_COMMUNITY)
Admission: RE | Admit: 2019-10-22 | Discharge: 2019-10-22 | Disposition: A | Discharge status: Home / Self Care | Payer: Commercial Managed Care - PPO | Source: Ambulatory Visit | Attending: General Surgery | Admitting: General Surgery

## 2019-10-22 ENCOUNTER — Encounter (HOSPITAL_COMMUNITY)
Admission: RE | Admit: 2019-10-22 | Discharge: 2019-10-22 | Disposition: A | Discharge status: Home / Self Care | Payer: Commercial Managed Care - PPO | Source: Ambulatory Visit | Attending: General Surgery | Admitting: General Surgery

## 2019-10-22 ENCOUNTER — Other Ambulatory Visit: Payer: Self-pay

## 2019-10-22 ENCOUNTER — Encounter (HOSPITAL_COMMUNITY): Payer: Self-pay

## 2019-10-22 DIAGNOSIS — Z20822 Contact with and (suspected) exposure to covid-19: Secondary | ICD-10-CM | POA: Insufficient documentation

## 2019-10-22 DIAGNOSIS — Z01812 Encounter for preprocedural laboratory examination: Secondary | ICD-10-CM | POA: Insufficient documentation

## 2019-10-22 LAB — BASIC METABOLIC PANEL
Anion gap: 11 (ref 5–15)
BUN: 12 mg/dL (ref 6–20)
CO2: 24 mmol/L (ref 22–32)
Calcium: 8.7 mg/dL — ABNORMAL LOW (ref 8.9–10.3)
Chloride: 104 mmol/L (ref 98–111)
Creatinine, Ser: 0.82 mg/dL (ref 0.61–1.24)
GFR calc Af Amer: >60 mL/min (ref >60)
GFR calc non Af Amer: >60 mL/min (ref >60)
Glucose, Bld: 100 mg/dL — ABNORMAL HIGH (ref 70–99)
Potassium: 3.6 mmol/L (ref 3.5–5.1)
Sodium: 139 mmol/L (ref 135–145)

## 2019-10-22 LAB — SARS CORONAVIRUS 2 (TAT 6-24 HRS): SARS Coronavirus 2: NEGATIVE

## 2019-10-24 ENCOUNTER — Ambulatory Visit (HOSPITAL_COMMUNITY)
Admission: RE | Admit: 2019-10-24 | Discharge: 2019-10-24 | Disposition: A | Discharge status: Home / Self Care | Payer: Commercial Managed Care - PPO | Attending: General Surgery | Admitting: General Surgery

## 2019-10-24 ENCOUNTER — Ambulatory Visit (HOSPITAL_COMMUNITY): Payer: Commercial Managed Care - PPO | Admitting: Certified Registered"

## 2019-10-24 ENCOUNTER — Encounter (HOSPITAL_COMMUNITY): Payer: Self-pay | Admitting: General Surgery

## 2019-10-24 ENCOUNTER — Encounter (HOSPITAL_COMMUNITY)
Admission: RE | Disposition: A | Discharge status: Home / Self Care | Payer: Self-pay | Source: Home / Self Care | Attending: General Surgery

## 2019-10-24 DIAGNOSIS — I2510 Atherosclerotic heart disease of native coronary artery without angina pectoris: Secondary | ICD-10-CM | POA: Diagnosis not present

## 2019-10-24 DIAGNOSIS — Z79899 Other long term (current) drug therapy: Secondary | ICD-10-CM | POA: Diagnosis not present

## 2019-10-24 DIAGNOSIS — Z955 Presence of coronary angioplasty implant and graft: Secondary | ICD-10-CM | POA: Diagnosis not present

## 2019-10-24 DIAGNOSIS — Z87891 Personal history of nicotine dependence: Secondary | ICD-10-CM | POA: Insufficient documentation

## 2019-10-24 DIAGNOSIS — E782 Mixed hyperlipidemia: Secondary | ICD-10-CM | POA: Insufficient documentation

## 2019-10-24 DIAGNOSIS — L02215 Cutaneous abscess of perineum: Secondary | ICD-10-CM | POA: Diagnosis present

## 2019-10-24 DIAGNOSIS — Z7982 Long term (current) use of aspirin: Secondary | ICD-10-CM | POA: Insufficient documentation

## 2019-10-24 DIAGNOSIS — K219 Gastro-esophageal reflux disease without esophagitis: Secondary | ICD-10-CM | POA: Insufficient documentation

## 2019-10-24 DIAGNOSIS — G4730 Sleep apnea, unspecified: Secondary | ICD-10-CM | POA: Insufficient documentation

## 2019-10-24 DIAGNOSIS — I252 Old myocardial infarction: Secondary | ICD-10-CM | POA: Insufficient documentation

## 2019-10-24 DIAGNOSIS — L723 Sebaceous cyst: Secondary | ICD-10-CM | POA: Diagnosis present

## 2019-10-24 DIAGNOSIS — L720 Epidermal cyst: Secondary | ICD-10-CM | POA: Diagnosis not present

## 2019-10-24 HISTORY — PX: MASS EXCISION: SHX2000

## 2019-10-24 SURGERY — EXCISION MASS
Anesthesia: General | Site: Perineum

## 2019-10-24 MED ORDER — DEXAMETHASONE SODIUM PHOSPHATE 10 MG/ML IJ SOLN
INTRAMUSCULAR | Status: DC | PRN
  Administered 2019-10-24: 10 mg via INTRAVENOUS

## 2019-10-24 MED ORDER — PROPOFOL 10 MG/ML IV BOLUS
INTRAVENOUS | Status: AC
  Filled 2019-10-24: qty 40

## 2019-10-24 MED ORDER — FENTANYL CITRATE (PF) 100 MCG/2ML IJ SOLN
INTRAMUSCULAR | Status: AC
  Filled 2019-10-24: qty 2

## 2019-10-24 MED ORDER — 0.9 % SODIUM CHLORIDE (POUR BTL) OPTIME
TOPICAL | Status: DC | PRN
  Administered 2019-10-24: 1000 mL

## 2019-10-24 MED ORDER — CEFAZOLIN SODIUM-DEXTROSE 2-4 GM/100ML-% IV SOLN
INTRAVENOUS | Status: AC
  Filled 2019-10-24: qty 100

## 2019-10-24 MED ORDER — FENTANYL CITRATE (PF) 100 MCG/2ML IJ SOLN
INTRAMUSCULAR | Status: DC | PRN
Start: 2019-10-24 — End: 2019-10-24
  Administered 2019-10-24 (×2): 25 ug via INTRAVENOUS
  Administered 2019-10-24: 50 ug via INTRAVENOUS

## 2019-10-24 MED ORDER — DEXAMETHASONE SODIUM PHOSPHATE 10 MG/ML IJ SOLN
INTRAMUSCULAR | Status: AC
  Filled 2019-10-24: qty 1

## 2019-10-24 MED ORDER — ONDANSETRON HCL 4 MG/2ML IJ SOLN: 4.0000 mg | Freq: Once | INTRAMUSCULAR | Status: DC | PRN

## 2019-10-24 MED ORDER — SUCCINYLCHOLINE CHLORIDE 200 MG/10ML IV SOSY
PREFILLED_SYRINGE | INTRAVENOUS | Status: AC
  Filled 2019-10-24: qty 10

## 2019-10-24 MED ORDER — PROPOFOL 10 MG/ML IV BOLUS
INTRAVENOUS | Status: DC | PRN
  Administered 2019-10-24: 160 mg via INTRAVENOUS

## 2019-10-24 MED ORDER — MIDAZOLAM HCL 2 MG/2ML IJ SOLN
INTRAMUSCULAR | Status: AC
  Filled 2019-10-24: qty 2

## 2019-10-24 MED ORDER — HYDROMORPHONE HCL 1 MG/ML IJ SOLN: 0.2500 mg | INTRAMUSCULAR | Status: DC | PRN

## 2019-10-24 MED ORDER — LACTATED RINGERS IV SOLN: INTRAVENOUS | Status: DC

## 2019-10-24 MED ORDER — CHLORHEXIDINE GLUCONATE 0.12 % MT SOLN
15.0000 mL | Freq: Once | OROMUCOSAL | Status: AC
  Administered 2019-10-24: 15 mL via OROMUCOSAL

## 2019-10-24 MED ORDER — CEFAZOLIN SODIUM-DEXTROSE 1-4 GM/50ML-% IV SOLN
INTRAVENOUS | Status: AC
  Filled 2019-10-24: qty 50

## 2019-10-24 MED ORDER — ONDANSETRON HCL 4 MG/2ML IJ SOLN
INTRAMUSCULAR | Status: AC
  Filled 2019-10-24: qty 2

## 2019-10-24 MED ORDER — ORAL CARE MOUTH RINSE: 15.0000 mL | Freq: Once | OROMUCOSAL | Status: AC

## 2019-10-24 MED ORDER — BACITRACIN-NEOMYCIN-POLYMYXIN 400-5-5000 EX OINT
TOPICAL_OINTMENT | CUTANEOUS | Status: AC
  Filled 2019-10-24: qty 3

## 2019-10-24 MED ORDER — BACITRACIN-NEOMYCIN-POLYMYXIN 400-5-5000 EX OINT
TOPICAL_OINTMENT | CUTANEOUS | Status: DC | PRN
  Administered 2019-10-24: 3 via TOPICAL

## 2019-10-24 MED ORDER — OXYCODONE HCL 5 MG PO TABS: 5.0000 mg | ORAL_TABLET | ORAL | 0 refills | Status: DC | PRN

## 2019-10-24 MED ORDER — LIDOCAINE 2% (20 MG/ML) 5 ML SYRINGE
INTRAMUSCULAR | Status: DC | PRN
  Administered 2019-10-24: 100 mg via INTRAVENOUS

## 2019-10-24 MED ORDER — ONDANSETRON HCL 4 MG PO TABS: 4.0000 mg | ORAL_TABLET | Freq: Three times a day (TID) | ORAL | 1 refills | Status: DC | PRN

## 2019-10-24 MED ORDER — MIDAZOLAM HCL 5 MG/5ML IJ SOLN
INTRAMUSCULAR | Status: DC | PRN
  Administered 2019-10-24: 2 mg via INTRAVENOUS

## 2019-10-24 MED ORDER — PROPOFOL 10 MG/ML IV BOLUS
INTRAVENOUS | Status: AC
  Filled 2019-10-24: qty 20

## 2019-10-24 MED ORDER — DOCUSATE SODIUM 100 MG PO CAPS: 100.0000 mg | ORAL_CAPSULE | Freq: Two times a day (BID) | ORAL | 2 refills | Status: DC | PRN

## 2019-10-24 MED ORDER — BUPIVACAINE HCL (PF) 0.5 % IJ SOLN
INTRAMUSCULAR | Status: AC
  Filled 2019-10-24: qty 30

## 2019-10-24 MED ORDER — ONDANSETRON HCL 4 MG/2ML IJ SOLN
INTRAMUSCULAR | Status: DC | PRN
  Administered 2019-10-24: 4 mg via INTRAVENOUS

## 2019-10-24 MED ORDER — DEXTROSE 5 % IV SOLN
3.0000 g | INTRAVENOUS | Status: AC
  Administered 2019-10-24: 3 g via INTRAVENOUS
  Filled 2019-10-24: qty 3000

## 2019-10-24 MED ORDER — LIDOCAINE 2% (20 MG/ML) 5 ML SYRINGE
INTRAMUSCULAR | Status: AC
  Filled 2019-10-24: qty 5

## 2019-10-24 MED ORDER — BUPIVACAINE HCL (PF) 0.5 % IJ SOLN
INTRAMUSCULAR | Status: DC | PRN
  Administered 2019-10-24: 10 mL

## 2019-10-24 MED ORDER — CHLORHEXIDINE GLUCONATE CLOTH 2 % EX PADS: 6.0000 | MEDICATED_PAD | Freq: Once | CUTANEOUS | Status: DC

## 2019-10-24 SURGICAL SUPPLY — 40 items
ADH SKN CLS APL DERMABOND .7 (GAUZE/BANDAGES/DRESSINGS)
CLOTH BEACON ORANGE TIMEOUT ST (SAFETY) ×2 IMPLANT
COVER LIGHT HANDLE STERIS (MISCELLANEOUS) ×4 IMPLANT
COVER WAND RF STERILE (DRAPES) ×2 IMPLANT
DECANTER SPIKE VIAL GLASS SM (MISCELLANEOUS) ×2 IMPLANT
DERMABOND ADVANCED (GAUZE/BANDAGES/DRESSINGS)
DERMABOND ADVANCED .7 DNX12 (GAUZE/BANDAGES/DRESSINGS) IMPLANT
DRAPE HALF SHEET 40X57 (DRAPES) ×1 IMPLANT
ELECT NDL TIP 2.8 STRL (NEEDLE) IMPLANT
ELECT NEEDLE TIP 2.8 STRL (NEEDLE) IMPLANT
ELECT REM PT RETURN 9FT ADLT (ELECTROSURGICAL) ×2
ELECTRODE REM PT RTRN 9FT ADLT (ELECTROSURGICAL) ×1 IMPLANT
GAUZE SPONGE 4X4 12PLY STRL (GAUZE/BANDAGES/DRESSINGS) ×1 IMPLANT
GLOVE BIO SURGEON STRL SZ 6.5 (GLOVE) ×2 IMPLANT
GLOVE BIO SURGEON STRL SZ7.5 (GLOVE) ×1 IMPLANT
GLOVE BIOGEL PI IND STRL 6.5 (GLOVE) ×1 IMPLANT
GLOVE BIOGEL PI IND STRL 7.0 (GLOVE) ×1 IMPLANT
GLOVE BIOGEL PI IND STRL 8 (GLOVE) IMPLANT
GLOVE BIOGEL PI INDICATOR 6.5 (GLOVE) ×2
GLOVE BIOGEL PI INDICATOR 7.0 (GLOVE) ×1
GLOVE BIOGEL PI INDICATOR 8 (GLOVE) ×1
GLOVE ECLIPSE 6.5 STRL STRAW (GLOVE) ×1 IMPLANT
GLOVE SURG SS PI 7.5 STRL IVOR (GLOVE) ×1 IMPLANT
GOWN STRL REUS W/TWL LRG LVL3 (GOWN DISPOSABLE) ×4 IMPLANT
KIT TURNOVER KIT A (KITS) ×2 IMPLANT
MANIFOLD NEPTUNE II (INSTRUMENTS) ×2 IMPLANT
NDL HYPO 25X1 1.5 SAFETY (NEEDLE) ×1 IMPLANT
NEEDLE HYPO 25X1 1.5 SAFETY (NEEDLE) ×2 IMPLANT
NS IRRIG 500ML POUR BTL (IV SOLUTION) ×1 IMPLANT
PACK MINOR (CUSTOM PROCEDURE TRAY) ×1 IMPLANT
PAD ARMBOARD 7.5X6 YLW CONV (MISCELLANEOUS) ×2 IMPLANT
SET BASIN LINEN APH (SET/KITS/TRAYS/PACK) ×2 IMPLANT
SHEET LAVH (DRAPES) ×1 IMPLANT
SPONGE GAUZE 2X2 8PLY STRL LF (GAUZE/BANDAGES/DRESSINGS) ×1 IMPLANT
SUT ETHILON 3 0 FSL (SUTURE) ×2 IMPLANT
SUT VIC AB 3-0 SH 27 (SUTURE) ×2
SUT VIC AB 3-0 SH 27X BRD (SUTURE) IMPLANT
SYR CONTROL 10ML LL (SYRINGE) ×2 IMPLANT
SYR TOOMEY 50ML (SYRINGE) ×1 IMPLANT
TAPE PAPER 3X10 WHT MICROPORE (GAUZE/BANDAGES/DRESSINGS) ×1 IMPLANT

## 2019-10-24 NOTE — Anesthesia Preprocedure Evaluation (Signed)
Anesthesia Evaluation  Patient identified by MRN, date of birth, ID band Patient awake    Reviewed: Allergy & Precautions, H&P , NPO status , Patient's Chart, lab work & pertinent test results, reviewed documented beta blocker date and time   Airway Mallampati: II  TM Distance: >3 FB Neck ROM: full    Dental no notable dental hx.    Pulmonary sleep apnea and Continuous Positive Airway Pressure Ventilation , Patient abstained from smoking., former smoker,    Pulmonary exam normal breath sounds clear to auscultation       Cardiovascular Exercise Tolerance: Good hypertension, + angina + CAD and + Past MI   Rhythm:regular Rate:Normal     Neuro/Psych negative neurological ROS  negative psych ROS   GI/Hepatic Neg liver ROS, GERD  Medicated,  Endo/Other  negative endocrine ROS  Renal/GU negative Renal ROS  negative genitourinary   Musculoskeletal   Abdominal   Peds  Hematology negative hematology ROS (+)   Anesthesia Other Findings   Reproductive/Obstetrics negative OB ROS                             Anesthesia Physical Anesthesia Plan  ASA: III  Anesthesia Plan: General   Post-op Pain Management:    Induction:   PONV Risk Score and Plan: Ondansetron  Airway Management Planned:   Additional Equipment:   Intra-op Plan:   Post-operative Plan:   Informed Consent: I have reviewed the patients History and Physical, chart, labs and discussed the procedure including the risks, benefits and alternatives for the proposed anesthesia with the patient or authorized representative who has indicated his/her understanding and acceptance.     Dental Advisory Given  Plan Discussed with: CRNA  Anesthesia Plan Comments:         Anesthesia Quick Evaluation

## 2019-10-24 NOTE — Progress Notes (Signed)
[facility]  Spoke with Tammy, surgery completed. Expect drainage. Neosporin to the areas. Right thigh and perineal incisions are sutured and monitor for signs of infection. Perianal incision is left open. Sitz baths/ shower after Bms and a few times a day for comfort. Pat everything dry after.   Wound check next week. Rx went to [facility].   Curlene Labrum, MD [facility] [address] [place], [place] [address] [phone] (office)

## 2019-10-24 NOTE — Op Note (Signed)
[facility] Operative Note  10/24/19  Preoperative Diagnosis: Right thigh cyst, perineal cyst   Postoperative Diagnosis: Same   Procedure(s) Performed:  Excision right thigh cyst (2cm), excision of perineal cyst (3cm) and perianal cyst (1cm)    Surgeon: Lanell Matar. Constance Haw, MD   Assistants: No qualified resident was available    Anesthesia: General anesthesia    Anesthesiologist: Louann Sjogren, MD    Specimens: Right thigh cyst, perineal cyst, perianal cyst    Estimated Blood Loss: Minimal   Blood Replacement: None    Complications: None   Wound Class: Contaminated    Operative Indications:  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 He has been on and off antibiotics, and is ready to get these excised. Recently he feels like the areas have been smaller per the perineum has been draining blood drainage. He has never had any evidence of any perianal fistula or disease as this is higher up between the anus and scrotum. We discussed the risk of excision including bleeding, infection, wound break down, need for packing, recurrence of the cyst.   Findings: Right thigh indurated cyst with some keratin drainage 2cm , perineum cyst with blood drainage 3cm, perianal cyst 1cm    Procedure: The patient was taken to the operating room and placed supine. General anesthesia was induced. Intravenous antibiotics were administered per protocol. He was then positioned in the lithotomy position with all pressure points padded.  The right thigh and perineum/ perianal area were prepared and draped in the usual sterile fashion.   The right thigh area had been marked and an elliptical excision of skin over the indurated area was performed in the long axis, and carried down through to the subcutaneous tissue with sharp excision with a blade and scissors. I was able to get around the cyst  and minimal keratin contents were suctioned out. The cyst was about 2cm. The cavity was made hemostatic and saline was flushed into the cavity. The deep space was closed with 3-0 Vicryl interrupted, and the skin edges were closed with 3-0 Nylon interrupted loosely to allow for any drainage.    From here the perineal area was examined and had been marked. This area was larger than first appreciated. An elliptical incision was made in the perineal skin where bloody drainage was expressed, and carried down through to the subcutaneous tissue with sharp excision with a blade and scissors. This tracked superior and with great care the 3cm + cyst was removed in its entirety.  The cavity was made hemostatic and saline was flushed. The deep space was closed in layers with 3-0 Vicryl interrupted sutures. The skin was closed with loose 3-0 Nylon sutures to allow for any drainage.  An additional perianal cyst about 1cm was noted just left of midline and greater than 3cm from the anal verge. This was superficial. Given that this was likely to get larger and cause problems. An elliptical excision of skin was made over the area, and the hard cyst was removed with sharp excision with a blade in its entirety. The cavity was made hemostatic but given the proximity to the anal area this was left open to close by second intention.   All wounds had neosporin applied. A gauze and tape were applied to the right thigh and the perineal/ perianal wounds had a pad and mesh panties in place  for drainage.   ll counts were correct at the end of the case. The patient was awakened from anesthesia and extubated without complication.  The patient went to the PACU in stable condition.   Curlene Labrum, MD [facility] [address] [place], [place] [address] [phone] (office)

## 2019-10-24 NOTE — Discharge Instructions (Signed)
Discharge Instructions:  Common Complaints: Pain at the incision site is common.  Some nausea is common and poor appetite after anesthesia. The main goal is to stay hydrated the first few days after surgery.   Diet/ Activity: Diet as tolerated. You may not have an appetite, but it is important to stay hydrated.  Drink 64 ounces of water a day. Your appetite will return with time.  Shower per your regular routine daily.  Sitz baths, a few inches of water in the tube OR showering off with handheld shower, at least 2 times day to soothe the perineal region and after any bowel movement.  Try not to get the right thigh incision wet if you sit in the bath.  Be sure to pat everything dry very well after any shower or sitz bath.  Walk everyday for at least 15-20 minutes. Deep cough and move around every 1-2 hours in the first few days after surgery.  Limit excessive movement, lifting > 10 lbs for the next week.    Limit stretching, pulling on your incision if it is located on other parts of your body.  Do not pick at the sutures. Keep an eye on the incision and call if any redness spreading. Some pink/bloody drainage expected and even yellow/ straw drainage, but if you start to see cloudy or purulent drainage call.  We expect drainage from the wounds and change a pad (lady's maxi pad) as needed and dry gauze on the right thigh. You can do neosporin to all the areas two to three times daily.   There is an open area closest to the left anal region. This was left open due to the proximity to the anus. This area clean after BMs and neosporin to the area.  Medication: Take tylenol and ibuprofen as needed for pain control, alternating every 4-6 hours.  Example:  Tylenol 1000mg  @ 6am, 12noon, 6pm, 20midnight (Do not exceed 4000mg  of tylenol a day). Ibuprofen 800mg  @ 9am, 3pm, 9pm, 3am (Do not exceed 3600mg  of ibuprofen a day).  Take Roxicodone for breakthrough pain every 4 hours.  Take Colace for  constipation related to narcotic pain medication. If you do not have a bowel movement in 2 days, take Miralax over the counter.  Drink plenty of water to also prevent constipation.   Contact Information: If you have questions or concerns, please call our office, [phone], Monday- Thursday 8AM-5PM and Friday 8AM-12Noon.  If it is after hours or on the weekend, please call [facility] Main [facility], [phone], and ask to speak to the surgeon on call for Dr. Constance Haw at [facility].     Epidermal Cyst Removal, Care After This sheet gives you information about how to care for yourself after your procedure. Your health care provider may also give you more specific instructions. If you have problems or questions, contact your health care provider. What can I expect after the procedure? After the procedure, it is common to have:  Soreness in the area where your cyst was removed.  Tightness or itchiness from the stitches (sutures) in your skin. Follow these instructions at home: Medicines  Take over-the-counter and prescription medicines only as told by your health care provider. Incision care   Follow instructions from your health care provider about how to take care of your incision. Make sure you: ? Wash your hands with soap and water before you change your bandage (dressing). If soap and water are not available, use hand sanitizer. ? Change your dressing as told  by your health care provider. ? Leave sutures, skin glue, or adhesive strips in place. These skin closures may need to stay in place for 1-2 weeks or longer. If adhesive strip edges start to loosen and curl up, you may trim the loose edges. Do not remove adhesive strips completely unless your health care provider tells you to do that.  After your dressing is off, check your incision area every day for signs of infection. Check for: ? Redness, swelling, or pain. ? Fluid or blood. ? Warmth. ? Pus or a bad smell. General  instructions  Your health care provider may ask you to avoid contact sports or activities that take a lot of effort. Do not do anything that stretches or puts pressure on your incision.  You can return to your normal diet.  Keep all follow-up visits as told by your health care provider. This is important. Contact a health care provider if:  You have a fever.  You have redness, swelling, or pain in the incision area.  You have fluid or blood coming from your incision.  You have pus or a bad smell coming from your incision.  Your incision feels warm to the touch.  Your cyst grows back. Summary  After the procedure, it is common to have soreness in the area where your cyst was removed.  Take or apply over-the-counter and prescription medicines only as told by your health care provider.  Follow instructions from your health care provider about how to take care of your incision. This information is not intended to replace advice given to you by your health care provider. Make sure you discuss any questions you have with your health care provider. Document Revised: 05/22/2017 Document Reviewed: 11/23/2016 Elsevier Patient Education  [place].   How to Take a [employer] A sitz bath is a warm water bath that may be used to care for your rectum, genital area, or the area between your rectum and genitals (perineum). For a sitz bath, the water only comes up to your hips and covers your buttocks. A sitz bath may done at home in a bathtub or with a portable sitz bath that fits over the toilet. Your health care provider may recommend a sitz bath to help:  Relieve pain and discomfort after delivering a baby.  Relieve pain and itching from hemorrhoids or anal fissures.  Relieve pain after certain surgeries.  Relax muscles that are sore or tight. How to take a sitz bath Take 3-4 sitz baths a day, or as many as told by your health care provider. Bathtub sitz bath To take a sitz  bath in a bathtub: 1. Partially fill a bathtub with warm water. The water should be deep enough to cover your hips and buttocks when you are sitting in the tub. 2. If your health care provider told you to put medicine in the water, follow his or her instructions. 3. Sit in the water. 4. Open the tub drain a little, and leave it open during your bath. 5. Turn on the warm water again, enough to replace the water that is draining out. Keep the water running throughout your bath. This helps keep the water at the right level and the right temperature. 6. Soak in the water for 15-20 minutes, or as long as told by your health care provider. 7. When you are done, be careful when you stand up. You may feel dizzy. 8. After the sitz bath, pat yourself dry. Do not rub  your skin to dry it.  Over-the-toilet sitz bath To take a sitz bath with an over-the-toilet basin: 1. Follow the manufacturer's instructions. 2. Fill the basin with warm water. 3. If your health care provider told you to put medicine in the water, follow his or her instructions. 4. Sit on the seat. Make sure the water covers your buttocks and perineum. 5. Soak in the water for 15-20 minutes, or as long as told by your health care provider. 6. After the sitz bath, pat yourself dry. Do not rub your skin to dry it. 7. Clean and dry the basin between uses. 8. Discard the basin if it cracks, or according to the manufacturer's instructions. Contact a health care provider if:  Your symptoms get worse. Do not continue with sitz baths if your symptoms get worse.  You have new symptoms. If this happens, do not continue with sitz baths until you talk with your health care provider. Summary  A sitz bath is a warm water bath in which the water only comes up to your hips and covers your buttocks.  A sitz bath may help relieve itching, relieve pain, and relax muscles that are sore or tight in the lower part of your body, including your genital  area.  Take 3-4 sitz baths a day, or as many as told by your health care provider. Soak in the water for 15-20 minutes.  Do not continue with sitz baths if your symptoms get worse. This information is not intended to replace advice given to you by your health care provider. Make sure you discuss any questions you have with your health care provider. Document Revised: 07/01/2018 Document Reviewed: 02/01/2017 Elsevier Patient Education  2020 [place] Anesthesia, Adult, Care After This sheet gives you information about how to care for yourself after your procedure. Your health care provider may also give you more specific instructions. If you have problems or questions, contact your health care provider. What can I expect after the procedure? After the procedure, the following side effects are common:  Pain or discomfort at the IV site.  Nausea.  Vomiting.  Sore throat.  Trouble concentrating.  Feeling cold or chills.  Weak or tired.  Sleepiness and fatigue.  Soreness and body aches. These side effects can affect parts of the body that were not involved in surgery. Follow these instructions at home:  For at least 24 hours after the procedure:  Have a responsible adult stay with you. It is important to have someone help care for you until you are awake and alert.  Rest as needed.  Do not: ? Participate in activities in which you could fall or become injured. ? Drive. ? Use heavy machinery. ? Drink alcohol. ? Take sleeping pills or medicines that cause drowsiness. ? Make important decisions or sign legal documents. ? Take care of children on your own. Eating and drinking  Follow any instructions from your health care provider about eating or drinking restrictions.  When you feel hungry, start by eating small amounts of foods that are soft and easy to digest (bland), such as toast. Gradually return to your regular diet.  Drink enough fluid to keep  your urine pale yellow.  If you vomit, rehydrate by drinking water, juice, or clear broth. General instructions  If you have sleep apnea, surgery and certain medicines can increase your risk for breathing problems. Follow instructions from your health care provider about wearing your sleep device: ? Anytime you  are sleeping, including during daytime naps. ? While taking prescription pain medicines, sleeping medicines, or medicines that make you drowsy.  Return to your normal activities as told by your health care provider. Ask your health care provider what activities are safe for you.  Take over-the-counter and prescription medicines only as told by your health care provider.  If you smoke, do not smoke without supervision.  Keep all follow-up visits as told by your health care provider. This is important. Contact a health care provider if:  You have nausea or vomiting that does not get better with medicine.  You cannot eat or drink without vomiting.  You have pain that does not get better with medicine.  You are unable to pass urine.  You develop a skin rash.  You have a fever.  You have redness around your IV site that gets worse. Get help right away if:  You have difficulty breathing.  You have chest pain.  You have blood in your urine or stool, or you vomit blood. Summary  After the procedure, it is common to have a sore throat or nausea. It is also common to feel tired.  Have a responsible adult stay with you for the first 24 hours after general anesthesia. It is important to have someone help care for you until you are awake and alert.  When you feel hungry, start by eating small amounts of foods that are soft and easy to digest (bland), such as toast. Gradually return to your regular diet.  Drink enough fluid to keep your urine pale yellow.  Return to your normal activities as told by your health care provider. Ask your health care provider what activities are  safe for you. This information is not intended to replace advice given to you by your health care provider. Make sure you discuss any questions you have with your health care provider. Document Revised: 02/02/2017 Document Reviewed: 09/15/2016 Elsevier Patient Education  [place].

## 2019-10-24 NOTE — Transfer of Care (Signed)
Immediate Anesthesia Transfer of Care Note  Patient: Scott Hatfield  Procedure(s) Performed: EXCISION; CYST; RIGHT THIGH; PERINEUM (N/A Perineum)  Patient Location: PACU  Anesthesia Type:General  Level of Consciousness: awake, alert , oriented and patient cooperative  Airway & Oxygen Therapy: Patient Spontanous Breathing and Patient connected to nasal cannula oxygen  Post-op Assessment: Report given to RN and Post -op Vital signs reviewed and stable  Post vital signs: Reviewed and stable  Last Vitals:  Vitals Value Taken Time  BP 112/66 10/24/19 0900  Temp    Pulse 77 10/24/19 0901  Resp 23 10/24/19 0901  SpO2 91 % 10/24/19 0901  Vitals shown include unvalidated device data.  Last Pain:  Vitals:   10/24/19 0645  PainSc: 0-No pain         Complications: No complications documented.

## 2019-10-24 NOTE — Interval H&P Note (Signed)
History and Physical Interval Note:  10/24/2019 7:18 AM  Scott Hatfield  has presented today for surgery, with the diagnosis of Sebaceous cyst, right thigh, perineal.  The various methods of treatment have been discussed with the patient and family. After consideration of risks, benefits and other options for treatment, the patient has consented to  Procedure(s): EXCISION; CYST; RIGHT THIGH; PERINEUM (N/A) as a surgical intervention.  The patient's history has been reviewed, patient examined, no change in status, stable for surgery.  I have reviewed the patient's chart and labs.  Questions were answered to the patient's satisfaction.    Marked, some drainage from the perineum.  Virl Cagey

## 2019-10-24 NOTE — Anesthesia Postprocedure Evaluation (Signed)
Anesthesia Post Note  Patient: Scott Hatfield  Procedure(s) Performed: EXCISION; CYST; RIGHT THIGH; PERINEUM (N/A Perineum)  Patient location during evaluation: PACU Anesthesia Type: General Level of consciousness: awake and alert, oriented and patient cooperative Pain management: pain level controlled Vital Signs Assessment: post-procedure vital signs reviewed and stable Respiratory status: spontaneous breathing and respiratory function stable Cardiovascular status: blood pressure returned to baseline and stable Postop Assessment: no apparent nausea or vomiting Anesthetic complications: no   No complications documented.   Last Vitals:  Vitals:   10/24/19 0645  BP: 138/77  Pulse: 79  Resp: (!) 25  Temp: 36.7 C  SpO2: 96%    Last Pain:  Vitals:   10/24/19 0645  PainSc: 0-No pain                 Carlin Mamone

## 2019-10-27 ENCOUNTER — Encounter (HOSPITAL_COMMUNITY): Payer: Self-pay | Admitting: General Surgery

## 2019-10-27 LAB — SURGICAL PATHOLOGY

## 2019-10-30 ENCOUNTER — Other Ambulatory Visit: Payer: Self-pay

## 2019-10-30 ENCOUNTER — Encounter: Payer: Self-pay | Admitting: General Surgery

## 2019-10-30 ENCOUNTER — Ambulatory Visit (INDEPENDENT_AMBULATORY_CARE_PROVIDER_SITE_OTHER): Payer: Commercial Managed Care - PPO | Admitting: General Surgery

## 2019-10-30 ENCOUNTER — Other Ambulatory Visit: Payer: Self-pay | Admitting: Family Medicine

## 2019-10-30 VITALS — BP 143/85 | HR 72 | Temp 98.4°F | Resp 16 | Ht 72.0 in | Wt 284.0 lb

## 2019-10-30 DIAGNOSIS — L02215 Cutaneous abscess of perineum: Secondary | ICD-10-CM

## 2019-10-30 DIAGNOSIS — L723 Sebaceous cyst: Secondary | ICD-10-CM

## 2019-10-30 NOTE — Progress Notes (Signed)
[facility] Note   HPI:  59 y.o. Male presents to clinic for post-op follow-up evaluation after excision of multiple sebaceous cyst in the perineal/ anal and right thigh region. He is doing well overall. Patient reports some drainage from the excision near the anus that was left open to heal.  Review of Systems:  No fever or chills No redness Sore drainage All other review of systems: otherwise negative   Vital Signs:  BP (!) 143/85   Pulse 72   Temp 98.4 F (36.9 C) (Oral)   Resp 16   Ht 6' (1.829 m)   Wt 284 lb (128.8 kg)   SpO2 93%   BMI 38.52 kg/m    Physical Exam:  Physical Exam Vitals reviewed.  Cardiovascular:     Rate and Rhythm: Normal rate.  Pulmonary:     Effort: Pulmonary effort is normal.  Genitourinary:    Comments: Perineal sutures intact without erythema or drainage, anal excision with subcutaneous tissue, no erythema neosporin ointment in area, right thigh incision c/d/i with sutures, no erythema or drainage  Pathology: FINAL MICROSCOPIC DIAGNOSIS:   A. PERINEUM, CYST, EXCISION:  - Findings consistent with ruptured epidermoid (inclusion) cyst.   B. THIGH, RIGHT, CYST, EXCISION:  - Findings consistent with ruptured epidermoid (inclusion) cyst.   C. PERIANAL, CYST, EXCISION:  - Epidermoid (inclusion) cyst.   Assessment:  60 y.o. yo Male with excision of multiple sebaceous cyst doing well overall.  Plan:  Keep area clean. Keep eye on the stitches and ensure no redness or drainage. Call if concern. Keep the neosporin in the area near th anus.  Follow up for possible suture removal 11/11/2019. Bring any paperwork you need filling out to clinic.  Will plan to remove sutures after 2 weeks   Future Appointments  Date Time Provider [place]  11/11/2019  9:15 AM Virl Cagey, MD [facility] None     Curlene Labrum, MD [facility] [address] [place], [place] [address] [phone]  (office)

## 2019-10-30 NOTE — Patient Instructions (Signed)
Keep area clean. Keep eye on the stitches and ensure no redness or drainage. Call if concern. Keep the neosporin in the area near th anus.  Follow up for possible suture removal 11/11/2019. Bring any paperwork you need filling out to clinic.

## 2019-11-11 ENCOUNTER — Other Ambulatory Visit: Payer: Self-pay | Admitting: Family Medicine

## 2019-11-11 ENCOUNTER — Other Ambulatory Visit: Payer: Self-pay

## 2019-11-11 ENCOUNTER — Ambulatory Visit (INDEPENDENT_AMBULATORY_CARE_PROVIDER_SITE_OTHER): Payer: Commercial Managed Care - PPO | Admitting: General Surgery

## 2019-11-11 ENCOUNTER — Encounter: Payer: Self-pay | Admitting: General Surgery

## 2019-11-11 VITALS — BP 138/87 | HR 68 | Temp 98.0°F | Resp 16 | Ht 72.0 in | Wt 287.0 lb

## 2019-11-11 DIAGNOSIS — L723 Sebaceous cyst: Secondary | ICD-10-CM

## 2019-11-11 NOTE — Progress Notes (Signed)
[facility] Note   HPI:  60 y.o. Male presents to clinic for post-op follow-up evaluation of his perineal and right thigh cyst excisions and perianal cyst excision. He is doing well. he notes a bulge in the perineal region but no drainage. The perianal excision was left open and continues to drain. He has been getting his wife to do neosporin on the perianal region and keeping it clean as directed.   Review of Systems:  No fever or chills All other review of systems: otherwise negative   Vital Signs:  BP 138/87   Pulse 68   Temp 98 F (36.7 C) (Oral)   Resp 16   Ht 6' (1.829 m)   Wt 287 lb (130.2 kg)   SpO2 95%   BMI 38.92 kg/m    Physical Exam:  Physical Exam Vitals reviewed.  Cardiovascular:     Rate and Rhythm: Normal rate.  Pulmonary:     Effort: Pulmonary effort is normal.  Genitourinary:    Comments: Right thigh incision c/d/i with sutures, sutures removed, perineal incision c/d/i with inverted edges that give slight rim with swelling, no erythema or drainage, sutures removed; perianal incision open and granulation at base, some drainage still, no signs of infection     Assessment:  60 y.o. yo Male with healing wounds after cyst excision. The rim of tissue in the perineum should continue to soften and the rim should get less prominent over time. The perianal incision is healing but I think there is too much moisture. Will move to a wet to dry dressing in that area if he tolerates it.  Plan:  Pack area at anus twice daily with saline dampened gauze (1/2 gauze, about 1/3 of the saline syringe).  Cover with dry gauze and tape.  If this does not work go back to neosporin and keep area clean especially after BMs.   Call work about your FMLA and your short term disability paperwork. Call office to make sure they get it.   Future Appointments  Date Time Provider [place]  11/18/2019  9:15 AM Virl Cagey, MD [facility] None   Curlene Labrum,  MD [facility] [address] [place], [place] [address] [phone] (office)

## 2019-11-11 NOTE — Patient Instructions (Addendum)
Pack area at anus twice daily with saline dampened gauze (1/2 gauze, about 1/3 of the saline syringe).  Cover with dry gauze and tape.  If this does not work go back to neosporin and keep area clean especially after BMs.   Call work about your FMLA and your short term disability paperwork. Call office to make sure they get it.

## 2019-11-14 ENCOUNTER — Other Ambulatory Visit: Payer: Self-pay | Admitting: Family Medicine

## 2019-11-14 DIAGNOSIS — Z720 Tobacco use: Secondary | ICD-10-CM

## 2019-11-14 DIAGNOSIS — F439 Reaction to severe stress, unspecified: Secondary | ICD-10-CM

## 2019-11-18 ENCOUNTER — Ambulatory Visit (INDEPENDENT_AMBULATORY_CARE_PROVIDER_SITE_OTHER): Payer: Commercial Managed Care - PPO | Admitting: General Surgery

## 2019-11-18 ENCOUNTER — Encounter: Payer: Self-pay | Admitting: General Surgery

## 2019-11-18 ENCOUNTER — Other Ambulatory Visit: Payer: Self-pay

## 2019-11-18 ENCOUNTER — Other Ambulatory Visit: Payer: Self-pay | Admitting: Family Medicine

## 2019-11-18 ENCOUNTER — Telehealth: Payer: Self-pay | Admitting: Family Medicine

## 2019-11-18 VITALS — BP 131/86 | HR 70 | Temp 98.6°F | Resp 16 | Ht 72.0 in | Wt 287.0 lb

## 2019-11-18 DIAGNOSIS — L02215 Cutaneous abscess of perineum: Secondary | ICD-10-CM

## 2019-11-18 DIAGNOSIS — L723 Sebaceous cyst: Secondary | ICD-10-CM

## 2019-11-18 NOTE — Patient Instructions (Signed)
Start letting area dry out some. Can do neosporin as needed, every other day.  Keep area clean Start doing more work around the house and driving to see how things go for returning to work

## 2019-11-18 NOTE — Telephone Encounter (Signed)
FMLA paperwork filled out and faxed to [employer] at [phone] with confirmation.  Short term disability filled out and faxed to [employer] at [phone] with confirmation.   Copies sent to scan.

## 2019-11-18 NOTE — Progress Notes (Signed)
[facility] Note   HPI:  59 y.o. Male presents to clinic for post-op follow-up evaluation of his sebaceous cyst excision and the perianal excision area that is healing by secondary intention. He has been packing the area but the packing was not staying in and they moved back to neosporin. Some minor bleeding noted. Still discomfort and decreased drainage.   Review of Systems:  No fever or chills Improving pain All other review of systems: otherwise negative   Vital Signs:  BP 131/86   Pulse 70   Temp 98.6 F (37 C) (Oral)   Resp 16   Ht 6' (1.829 m)   Wt 287 lb (130.2 kg)   SpO2 94%   BMI 38.92 kg/m    Physical Exam:  Physical Exam Vitals reviewed.  Cardiovascular:     Rate and Rhythm: Normal rate.  Pulmonary:     Effort: Pulmonary effort is normal.  Genitourinary:    Comments: Perianal wound with granulation at base, edges with epithelization, neosporin applied, no erythema or drainage, decreasing in skin Neurological:     Mental Status: He is alert.      Assessment:  59 y.o. yo Male with perianal wound healing by secondary intention. Doing well over all. 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  Plan:  Start letting area dry out some. Can do neosporin as needed, every other day.  Keep area clean Start doing more work around the house and driving to see how things go for returning to work  Future Appointments  Date Time Provider [place]  11/25/2019  9:15 AM Constance Haw, Lanell Matar, MD [facility] None    Curlene Labrum, MD [facility] [address] [place], [place] [address] [phone] (office)

## 2019-11-25 ENCOUNTER — Other Ambulatory Visit: Payer: Self-pay | Admitting: Family Medicine

## 2019-11-25 ENCOUNTER — Ambulatory Visit (INDEPENDENT_AMBULATORY_CARE_PROVIDER_SITE_OTHER): Payer: Commercial Managed Care - PPO | Admitting: General Surgery

## 2019-11-25 ENCOUNTER — Encounter: Payer: Self-pay | Admitting: General Surgery

## 2019-11-25 ENCOUNTER — Other Ambulatory Visit: Payer: Self-pay

## 2019-11-25 VITALS — BP 136/88 | HR 71 | Temp 98.2°F | Resp 14 | Ht 72.0 in | Wt 290.0 lb

## 2019-11-25 DIAGNOSIS — L723 Sebaceous cyst: Secondary | ICD-10-CM

## 2019-11-25 DIAGNOSIS — L02215 Cutaneous abscess of perineum: Secondary | ICD-10-CM

## 2019-11-25 NOTE — Patient Instructions (Signed)
Return to work 11/26/2019 without restrictions. Keep area dry and use dry gauze if needed if more drainage.

## 2019-11-25 NOTE — Progress Notes (Signed)
[facility] Note   HPI:  59 y.o. Male presents to clinic for follow-up evaluation of his perianal wound after excision of cyst. Overall doing well and having less drainage.   Review of Systems:  No fever or chill Less drainage All other review of systems: otherwise negative   Vital Signs:  BP 136/88   Pulse 71   Temp 98.2 F (36.8 C) (Oral)   Resp 14   Ht 6' (1.829 m)   Wt 290 lb (131.5 kg)   SpO2 94%   BMI 39.33 kg/m    Physical Exam:  Physical Exam Vitals reviewed.  Cardiovascular:     Rate and Rhythm: Normal rate.  Pulmonary:     Effort: Pulmonary effort is normal.  Genitourinary:    Comments: Left perianal wound with granulation and epithelization around the edges Neurological:     Mental Status: He is alert.     Assessment:  59 y.o. yo Male with a perianal wound that is healing and has epithelization.  Plan:  Return to work 11/26/2019 without restrictions. Keep area dry and use dry gauze if needed if more drainage.   Future Appointments  Date Time Provider [place]  11/25/2019  9:15 AM Virl Cagey, MD [facility] None  12/23/2019  9:15 AM Virl Cagey, MD [facility] None     Curlene Labrum, MD [facility] [address] [place], [place] [address] [phone] (office)

## 2019-12-15 ENCOUNTER — Other Ambulatory Visit: Payer: Self-pay | Admitting: Family Medicine

## 2019-12-15 MED ORDER — GUAIFENESIN-CODEINE 100-10 MG/5ML PO SOLN
5.0000 mL | Freq: Four times a day (QID) | ORAL | 0 refills | Status: DC | PRN
Start: 2019-12-15 — End: 2019-12-24

## 2019-12-15 NOTE — Progress Notes (Signed)
Scheduled in [place] clinic tomorrow.  Cough syrup sent in the interim.

## 2019-12-16 ENCOUNTER — Ambulatory Visit (INDEPENDENT_AMBULATORY_CARE_PROVIDER_SITE_OTHER): Payer: Commercial Managed Care - PPO

## 2019-12-16 ENCOUNTER — Other Ambulatory Visit: Payer: Self-pay

## 2019-12-16 ENCOUNTER — Encounter: Payer: Self-pay | Admitting: Family Medicine

## 2019-12-16 ENCOUNTER — Ambulatory Visit (INDEPENDENT_AMBULATORY_CARE_PROVIDER_SITE_OTHER): Payer: Commercial Managed Care - PPO | Admitting: Family Medicine

## 2019-12-16 VITALS — BP 141/87 | HR 81 | Temp 98.4°F

## 2019-12-16 DIAGNOSIS — R059 Cough, unspecified: Secondary | ICD-10-CM | POA: Diagnosis not present

## 2019-12-16 DIAGNOSIS — J069 Acute upper respiratory infection, unspecified: Secondary | ICD-10-CM

## 2019-12-16 DIAGNOSIS — J209 Acute bronchitis, unspecified: Secondary | ICD-10-CM

## 2019-12-16 MED ORDER — PREDNISONE 20 MG PO TABS: 40.0000 mg | ORAL_TABLET | Freq: Every day | ORAL | 0 refills | Status: AC

## 2019-12-16 MED ORDER — CEFDINIR 300 MG PO CAPS: 300.0000 mg | ORAL_CAPSULE | Freq: Two times a day (BID) | ORAL | 0 refills | Status: DC

## 2019-12-16 MED ORDER — ALBUTEROL SULFATE HFA 108 (90 BASE) MCG/ACT IN AERS: 2.0000 | INHALATION_SPRAY | Freq: Four times a day (QID) | RESPIRATORY_TRACT | 0 refills | Status: DC | PRN

## 2019-12-16 NOTE — Progress Notes (Signed)
Subjective: CC: Cough PCP: Janora Norlander, DO [place] is a 59 y.o. male presenting to clinic today for:  1.  Cough Patient reports abrupt onset of harsh coughing and sensation of difficulty taking a deep breath in yesterday.  He has been using a cough syrup which has helped some with the cough but he continues to have some tightness with deep breath.  He denies any fevers, chills, nausea, vomiting, diarrhea or myalgia.  He has had COVID-19 last year and then was given the Covid vaccinations this year.  No known sick contacts.  He is an active every day smoker.  He has a history of cardiovascular disease.   ROS: Per HPI  No Known Allergies Past Medical History:  Diagnosis Date  . Allergy    occasionally   . CAD in native artery    a. 2008 - Single-vessel CAD with stent placement to the circumflex  b. 2012 - DES to Diag.  c. 06/2014 - RCA 25% stenosis, LAD 30% stenosis, ramus intermediate 45% stenosis, D1 95% stenosis treated with DES  d.01/2015 - nonobstructive CAD  . GERD (gastroesophageal reflux disease)   . Hyperlipidemia, mixed   . Hypertension   . Myocardial infarction ([place]) 2008  . Overweight(278.02)   . Sleep apnea    CPAP    Current Outpatient Medications:  .  amLODipine (NORVASC) 10 MG tablet, Take 1 tablet (10 mg total) by mouth daily., Disp: 90 tablet, Rfl: 3 .  aspirin 81 MG chewable tablet, Chew 81 mg by mouth daily., Disp: , Rfl:  .  atorvastatin (LIPITOR) 40 MG tablet, TAKE 1 TABLET BY MOUTH AT 6 IN THE EVENING (Patient taking differently: Take 40 mg by mouth every evening. TAKE 1 TABLET BY MOUTH AT 6 IN THE EVENING), Disp: 90 tablet, Rfl: 3 .  buPROPion (WELLBUTRIN XL) 150 MG 24 hr tablet, Take 1 tablet (150 mg total) by mouth every morning. (Needs to be seen before next refill), Disp: 30 tablet, Rfl: 0 .  docusate sodium (COLACE) 100 MG capsule, Take 1 capsule (100 mg total) by mouth 2 (two) times daily as needed for mild constipation (narcotic).,  Disp: 60 capsule, Rfl: 2 .  guaiFENesin-codeine 100-10 MG/5ML syrup, Take 5 mLs by mouth every 6 (six) hours as needed for cough., Disp: 120 mL, Rfl: 0 .  hydrochlorothiazide (HYDRODIURIL) 25 MG tablet, Take 1 tablet (25 mg total) by mouth daily., Disp: 90 tablet, Rfl: 3 .  lisinopril (ZESTRIL) 40 MG tablet, Take 1 tablet (40 mg total) by mouth daily., Disp: 90 tablet, Rfl: 3 .  metoprolol tartrate (LOPRESSOR) 50 MG tablet, Take 1 tablet (50 mg total) by mouth 2 (two) times daily., Disp: 180 tablet, Rfl: 3 .  nitroGLYCERIN (NITROSTAT) 0.4 MG SL tablet, Place 1 tablet (0.4 mg total) under the tongue every 5 (five) minutes as needed. May repeat for up to 3 doses. (Patient taking differently: Place 0.4 mg under the tongue every 5 (five) minutes x 3 doses as needed for chest pain. ), Disp: 25 tablet, Rfl: prn .  ondansetron (ZOFRAN) 4 MG tablet, Take 1 tablet (4 mg total) by mouth every 8 (eight) hours as needed., Disp: 30 tablet, Rfl: 1 .  umeclidinium-vilanterol (ANORO ELLIPTA) 62.5-25 MCG/INH AEPB, Inhale 1 puff into the lungs daily., Disp: 1 each, Rfl: 0 Social History   Socioeconomic History  . Marital status: Married    Spouse name: Not on file  . Number of children: Not on file  . Years  of education: Not on file  . Highest education level: Not on file  Occupational History  . Occupation: Full time    Employer: [employer]  Tobacco Use  . Smoking status: Former Smoker    Start date: 03/20/2012    Quit date: 01/30/2017    Years since quitting: 2.8  . Smokeless tobacco: Never Used  Vaping Use  . Vaping Use: Never used  Substance and Sexual Activity  . Alcohol use: Yes    Alcohol/week: 0.0 standard drinks    Comment: occ  . Drug use: No  . Sexual activity: Not on file  Other Topics Concern  . Not on file  Social History Narrative   Married   No regular exercise   Social Determinants of Health   Financial Resource Strain:   . Difficulty of Paying Living Expenses: Not on  file  Food Insecurity:   . Worried About Charity fundraiser in the Last Year: Not on file  . Ran Out of Food in the Last Year: Not on file  Transportation Needs:   . Lack of Transportation (Medical): Not on file  . Lack of Transportation (Non-Medical): Not on file  Physical Activity:   . Days of Exercise per Week: Not on file  . Minutes of Exercise per Session: Not on file  Stress:   . Feeling of Stress : Not on file  Social Connections:   . Frequency of Communication with Friends and Family: Not on file  . Frequency of Social Gatherings with Friends and Family: Not on file  . Attends Religious Services: Not on file  . Active Member of Clubs or Organizations: Not on file  . Attends Archivist Meetings: Not on file  . Marital Status: Not on file  Intimate Partner Violence:   . Fear of Current or Ex-Partner: Not on file  . Emotionally Abused: Not on file  . Physically Abused: Not on file  . Sexually Abused: Not on file   Family History  Problem Relation Age of Onset  . Heart disease Mother   . Colon polyps Father   . Coronary artery disease Other   . Colon polyps Sister   . Colon cancer Neg Hx   . Esophageal cancer Neg Hx   . Rectal cancer Neg Hx   . Stomach cancer Neg Hx     Objective: Office vital signs reviewed. BP (!) 141/87   Pulse 81   Temp 98.4 F (36.9 C)   SpO2 94%   Physical Examination:  General: Awake, alert, obese, No acute distress HEENT: Normal; sclera somewhat injected.  No appreciable rhinorrhea Cardio: regular rate and rhythm, S1S2 heard, no murmurs appreciated Pulm: Mild, global expiratory wheezes noted.  He does have slight decreased breath sounds on the right.  He is intermittently coughing during exam.  No dyspnea with speech.  He has normal work of breathing on room air  Assessment/ Plan: 59 y.o. male   1. Cough Suspect a viral mediated etiology after review of his chest x-ray.  There were no apparent pulmonary infiltrates upon  personal review.  However, awaiting formal review by radiology.  Given his multiple comorbidities including ongoing smoking, I am going to empirically treat him with oral antibiotics. - DG Chest 2 View; Future - Novel Coronavirus, NAA ([facility])  2. URI with cough and congestion There are appreciable wheezes on today's exam.  Chest x-ray was obtained as above due to decreased breath sounds in the right side but again after personal  review I do not appreciate pulmonary infiltrates.  Steroid burst prescribed.  Omnicef, albuterol.  He understands red flag signs and symptoms warranting further evaluation.  Additionally he was rechecked for COVID-19 today - albuterol (VENTOLIN HFA) 108 (90 Base) MCG/ACT inhaler; Inhale 2 puffs into the lungs every 6 (six) hours as needed for wheezing or shortness of breath.  Dispense: 8 g; Refill: 0 - predniSONE (DELTASONE) 20 MG tablet; Take 2 tablets (40 mg total) by mouth daily with breakfast for 5 days.  Dispense: 10 tablet; Refill: 0 - cefdinir (OMNICEF) 300 MG capsule; Take 1 capsule (300 mg total) by mouth 2 (two) times daily. 1 po BID  Dispense: 20 capsule; Refill: 0  3. Bronchospasm with bronchitis, acute As above - albuterol (VENTOLIN HFA) 108 (90 Base) MCG/ACT inhaler; Inhale 2 puffs into the lungs every 6 (six) hours as needed for wheezing or shortness of breath.  Dispense: 8 g; Refill: 0 - predniSONE (DELTASONE) 20 MG tablet; Take 2 tablets (40 mg total) by mouth daily with breakfast for 5 days.  Dispense: 10 tablet; Refill: 0 - cefdinir (OMNICEF) 300 MG capsule; Take 1 capsule (300 mg total) by mouth 2 (two) times daily. 1 po BID  Dispense: 20 capsule; Refill: 0   No orders of the defined types were placed in this encounter.  No orders of the defined types were placed in this encounter.    Janora Norlander, DO [place] [phone]

## 2019-12-16 NOTE — Patient Instructions (Signed)
I think this is likely viral but am putting you on antibiotics due to your risk factors. We checked COVID and an xray today.  Stay out of work until symptoms are improving and you hear back about your tests.   Acute Bronchitis, Adult  Acute bronchitis is when air tubes in the lungs (bronchi) suddenly get swollen. The condition can make it hard for you to breathe. In adults, acute bronchitis usually goes away within 2 weeks. A cough caused by bronchitis may last up to 3 weeks. Smoking, allergies, and asthma can make the condition worse. What are the causes? This condition is caused by:  Cold and flu viruses. The most common cause of this condition is the virus that causes the common cold.  Bacteria.  Substances that irritate the lungs, including: ? Smoke from cigarettes and other types of tobacco. ? Dust and pollen. ? Fumes from chemicals, gases, or burned fuel. ? Other materials that pollute indoor or outdoor air.  Close contact with someone who has acute bronchitis. What increases the risk? The following factors may make you more likely to develop this condition:  A weak body's defense system. This is also called the immune system.  Any condition that affects your lungs and breathing, such as asthma. What are the signs or symptoms? Symptoms of this condition include:  A cough.  Coughing up clear, yellow, or green mucus.  Wheezing.  Chest congestion.  Shortness of breath.  A fever.  Body aches.  Chills.  A sore throat. How is this treated? Acute bronchitis may go away over time without treatment. Your doctor may recommend:  Drinking more fluids.  Taking a medicine for a fever or cough.  Using a device that gets medicine into your lungs (inhaler).  Using a vaporizer or a humidifier. These are machines that add water or moisture in the air to help with coughing and poor breathing. Follow these instructions at home:  Activity  Get a lot of rest.  Avoid  places where there are fumes from chemicals.  Return to your normal activities as told by your doctor. Ask your doctor what activities are safe for you. Lifestyle  Drink enough fluids to keep your pee (urine) pale yellow.  Do not drink alcohol.  Do not use any products that contain nicotine or tobacco, such as cigarettes, e-cigarettes, and chewing tobacco. If you need help quitting, ask your doctor. Be aware that: ? Your bronchitis will get worse if you smoke or breathe in other people's smoke (secondhand smoke). ? Your lungs will heal faster if you quit smoking. General instructions  Take over-the-counter and prescription medicines only as told by your doctor.  Use an inhaler, cool mist vaporizer, or humidifier as told by your doctor.  Rinse your mouth often with salt water. To make salt water, dissolve -1 tsp (3-6 g) of salt in 1 cup (237 mL) of warm water.  Keep all follow-up visits as told by your doctor. This is important. How is this prevented? To lower your risk of getting this condition again:  Wash your hands often with soap and water. If soap and water are not available, use hand sanitizer.  Avoid contact with people who have cold symptoms.  Try not to touch your mouth, nose, or eyes with your hands.  Make sure to get the flu shot every year. Contact a doctor if:  Your symptoms do not get better in 2 weeks.  You vomit more than once or twice.  You have symptoms  of loss of fluid from your body (dehydration). These include: ? Dark urine. ? Dry skin or eyes. ? Increased thirst. ? Headaches. ? Confusion. ? Muscle cramps. Get help right away if:  You cough up blood.  You have chest pain.  You have very bad shortness of breath.  You become dehydrated.  You faint or keep feeling like you are going to faint.  You keep vomiting.  You have a very bad headache.  Your fever or chills get worse. These symptoms may be an emergency. Do not wait to see if the  symptoms will go away. Get medical help right away. Call your local emergency services (911 in the [place] Do not drive yourself to the hospital. Summary  Acute bronchitis is when air tubes in the lungs (bronchi) suddenly get swollen. In adults, acute bronchitis usually goes away within 2 weeks.  Take over-the-counter and prescription medicines only as told by your doctor.  Drink enough fluid to keep your pee (urine) pale yellow.  Contact a doctor if your symptoms do not improve after 2 weeks of treatment.  Get help right away if you cough up blood, faint, or have chest pain or shortness of breath. This information is not intended to replace advice given to you by your health care provider. Make sure you discuss any questions you have with your health care provider. Document Revised: 08/23/2018 Document Reviewed: 08/23/2018 Elsevier Patient Education  [place].

## 2019-12-17 LAB — NOVEL CORONAVIRUS, NAA: SARS-CoV-2, NAA: NOT DETECTED

## 2019-12-17 LAB — SARS-COV-2, NAA 2 DAY TAT

## 2019-12-18 ENCOUNTER — Other Ambulatory Visit: Payer: Self-pay | Admitting: Family Medicine

## 2019-12-18 DIAGNOSIS — Z720 Tobacco use: Secondary | ICD-10-CM

## 2019-12-18 DIAGNOSIS — F439 Reaction to severe stress, unspecified: Secondary | ICD-10-CM

## 2019-12-18 DIAGNOSIS — I10 Essential (primary) hypertension: Secondary | ICD-10-CM

## 2019-12-23 ENCOUNTER — Ambulatory Visit (INDEPENDENT_AMBULATORY_CARE_PROVIDER_SITE_OTHER): Payer: Commercial Managed Care - PPO | Admitting: General Surgery

## 2019-12-23 ENCOUNTER — Encounter: Payer: Self-pay | Admitting: Family Medicine

## 2019-12-23 ENCOUNTER — Encounter: Payer: Self-pay | Admitting: General Surgery

## 2019-12-23 ENCOUNTER — Other Ambulatory Visit: Payer: Self-pay

## 2019-12-23 VITALS — BP 145/91 | HR 70 | Temp 97.7°F | Resp 18 | Ht 72.0 in | Wt 289.0 lb

## 2019-12-23 DIAGNOSIS — L723 Sebaceous cyst: Secondary | ICD-10-CM

## 2019-12-23 NOTE — Patient Instructions (Signed)
Acapella Flutter/ Vibratory PEP Mucus Clearance   Activity as tolerated.

## 2019-12-23 NOTE — Progress Notes (Signed)
[facility]   HPI:  59 y.o. Male presents to clinic for follow-up evaluation of his excision of a sebaceous cyst from the perineal area and perianal region. He is healed. Minor soreness at times.   Review of Systems:  No drainage No redness  All other review of systems: otherwise negative   Vital Signs:  BP (!) 145/91   Pulse 70   Temp 97.7 F (36.5 C) (Oral)   Resp 18   Ht 6' (1.829 m)   Wt 289 lb (131.1 kg)   SpO2 96%   BMI 39.20 kg/m    Physical Exam:  Physical Exam Vitals reviewed.  Cardiovascular:     Rate and Rhythm: Normal rate.  Pulmonary:     Effort: Pulmonary effort is normal.  Genitourinary:    Comments: Healed right thigh incision, healed perineal incision with softening rim, perianal excision healed with epithelized skin     Assessment:  59 y.o. yo Male with a healed perineal and perianal excision.   Plan:  - Activity as tolerated  - PRN follow up   Curlene Labrum, MD [facility] [address] [place], [place] [address] [phone] (office)

## 2019-12-24 ENCOUNTER — Encounter: Payer: Self-pay | Admitting: Family Medicine

## 2019-12-24 ENCOUNTER — Ambulatory Visit (INDEPENDENT_AMBULATORY_CARE_PROVIDER_SITE_OTHER): Payer: Commercial Managed Care - PPO

## 2019-12-24 ENCOUNTER — Ambulatory Visit (INDEPENDENT_AMBULATORY_CARE_PROVIDER_SITE_OTHER): Payer: Commercial Managed Care - PPO | Admitting: Family Medicine

## 2019-12-24 VITALS — BP 139/79 | HR 89 | Temp 99.4°F | Ht 72.0 in | Wt 289.0 lb

## 2019-12-24 DIAGNOSIS — J209 Acute bronchitis, unspecified: Secondary | ICD-10-CM | POA: Diagnosis not present

## 2019-12-24 DIAGNOSIS — R059 Cough, unspecified: Secondary | ICD-10-CM | POA: Diagnosis not present

## 2019-12-24 MED ORDER — GUAIFENESIN-CODEINE 100-10 MG/5ML PO SOLN: 5.0000 mL | Freq: Four times a day (QID) | ORAL | 0 refills | Status: DC | PRN

## 2019-12-24 MED ORDER — BREZTRI AEROSPHERE 160-9-4.8 MCG/ACT IN AERO: 2.0000 | INHALATION_SPRAY | Freq: Two times a day (BID) | RESPIRATORY_TRACT | 0 refills | Status: DC

## 2019-12-24 MED ORDER — METHYLPREDNISOLONE ACETATE 40 MG/ML IJ SUSP
80.0000 mg | Freq: Once | INTRAMUSCULAR | Status: AC
  Administered 2019-12-24: 80 mg via INTRAMUSCULAR

## 2019-12-24 NOTE — Progress Notes (Signed)
Subjective: CC: Shortness of breath, cough PCP: Janora Norlander, DO ZOX:WRUEAVW [place] is a 59 y.o. male presenting to clinic today for:  1.  Shortness of breath, cough Patient with ongoing shortness of breath and cough.  He was seen on the second of the month and diagnosed with a viral URI.  He was empirically treated with oral antibiotics and steroids.  He follows up today with ongoing symptoms.  He reports symptoms seem to be worse at nighttime when he tries to lay down with his CPAP machine.  He denies any increase lower extremity swelling.  No fevers.  No hemoptysis.  He has been using the albuterol inhaler and it helps somewhat.  He is an active smoker.   ROS: Per HPI  No Known Allergies Past Medical History:  Diagnosis Date  . Allergy    occasionally   . CAD in native artery    a. 2008 - Single-vessel CAD with stent placement to the circumflex  b. 2012 - DES to Diag.  Scott. 06/2014 - RCA 25% stenosis, LAD 30% stenosis, ramus intermediate 45% stenosis, D1 95% stenosis treated with DES  d.01/2015 - nonobstructive CAD  . GERD (gastroesophageal reflux disease)   . Hyperlipidemia, mixed   . Hypertension   . Myocardial infarction ([place]) 2008  . Overweight(278.02)   . Sleep apnea    CPAP    Current Outpatient Medications:  .  albuterol (VENTOLIN HFA) 108 (90 Base) MCG/ACT inhaler, Inhale 2 puffs into the lungs every 6 (six) hours as needed for wheezing or shortness of breath., Disp: 8 g, Rfl: 0 .  amLODipine (NORVASC) 10 MG tablet, Take 1 tablet (10 mg total) by mouth daily., Disp: 90 tablet, Rfl: 3 .  aspirin 81 MG chewable tablet, Chew 81 mg by mouth daily., Disp: , Rfl:  .  atorvastatin (LIPITOR) 40 MG tablet, TAKE 1 TABLET BY MOUTH AT 6 IN THE EVENING (Patient taking differently: Take 40 mg by mouth every evening. TAKE 1 TABLET BY MOUTH AT 6 IN THE EVENING), Disp: 90 tablet, Rfl: 3 .  buPROPion (WELLBUTRIN XL) 150 MG 24 hr tablet, TAKE 1 TABLET BY MOUTH ONCE DAILY IN THE  MORNING . APPOINTMENT REQUIRED FOR FUTURE REFILLS, Disp: 30 tablet, Rfl: 0 .  cefdinir (OMNICEF) 300 MG capsule, Take 1 capsule (300 mg total) by mouth 2 (two) times daily. 1 po BID, Disp: 20 capsule, Rfl: 0 .  docusate sodium (COLACE) 100 MG capsule, Take 1 capsule (100 mg total) by mouth 2 (two) times daily as needed for mild constipation (narcotic)., Disp: 60 capsule, Rfl: 2 .  guaiFENesin-codeine 100-10 MG/5ML syrup, Take 5 mLs by mouth every 6 (six) hours as needed for cough., Disp: 120 mL, Rfl: 0 .  hydrochlorothiazide (HYDRODIURIL) 25 MG tablet, Take 1 tablet by mouth once daily, Disp: 90 tablet, Rfl: 0 .  lisinopril (ZESTRIL) 40 MG tablet, Take 1 tablet (40 mg total) by mouth daily., Disp: 90 tablet, Rfl: 3 .  metoprolol tartrate (LOPRESSOR) 50 MG tablet, Take 1 tablet (50 mg total) by mouth 2 (two) times daily., Disp: 180 tablet, Rfl: 3 .  nitroGLYCERIN (NITROSTAT) 0.4 MG SL tablet, Place 1 tablet (0.4 mg total) under the tongue every 5 (five) minutes as needed. May repeat for up to 3 doses. (Patient taking differently: Place 0.4 mg under the tongue every 5 (five) minutes x 3 doses as needed for chest pain. ), Disp: 25 tablet, Rfl: prn .  ondansetron (ZOFRAN) 4 MG tablet, Take 1  tablet (4 mg total) by mouth every 8 (eight) hours as needed., Disp: 30 tablet, Rfl: 1 .  umeclidinium-vilanterol (ANORO ELLIPTA) 62.5-25 MCG/INH AEPB, Inhale 1 puff into the lungs daily., Disp: 1 each, Rfl: 0 Social History   Socioeconomic History  . Marital status: Married    Spouse name: Not on file  . Number of children: Not on file  . Years of education: Not on file  . Highest education level: Not on file  Occupational History  . Occupation: Full time    Employer: [employer]  Tobacco Use  . Smoking status: Former Smoker    Start date: 03/20/2012    Quit date: 01/30/2017    Years since quitting: 2.8  . Smokeless tobacco: Never Used  Vaping Use  . Vaping Use: Never used  Substance and Sexual  Activity  . Alcohol use: Yes    Alcohol/week: 0.0 standard drinks    Comment: occ  . Drug use: No  . Sexual activity: Not on file  Other Topics Concern  . Not on file  Social History Narrative   Married   No regular exercise   Social Determinants of Health   Financial Resource Strain:   . Difficulty of Paying Living Expenses: Not on file  Food Insecurity:   . Worried About Charity fundraiser in the Last Year: Not on file  . Ran Out of Food in the Last Year: Not on file  Transportation Needs:   . Lack of Transportation (Medical): Not on file  . Lack of Transportation (Non-Medical): Not on file  Physical Activity:   . Days of Exercise per Week: Not on file  . Minutes of Exercise per Session: Not on file  Stress:   . Feeling of Stress : Not on file  Social Connections:   . Frequency of Communication with Friends and Family: Not on file  . Frequency of Social Gatherings with Friends and Family: Not on file  . Attends Religious Services: Not on file  . Active Member of Clubs or Organizations: Not on file  . Attends Archivist Meetings: Not on file  . Marital Status: Not on file  Intimate Partner Violence:   . Fear of Current or Ex-Partner: Not on file  . Emotionally Abused: Not on file  . Physically Abused: Not on file  . Sexually Abused: Not on file   Family History  Problem Relation Age of Onset  . Heart disease Mother   . Colon polyps Father   . Coronary artery disease Other   . Colon polyps Sister   . Colon cancer Neg Hx   . Esophageal cancer Neg Hx   . Rectal cancer Neg Hx   . Stomach cancer Neg Hx     Objective: Office vital signs reviewed. BP 139/79   Pulse 89   Temp 99.4 F (37.4 [place])   Ht 6' (1.829 m)   Wt 289 lb (131.1 kg)   SpO2 93%   BMI 39.20 kg/m   Physical Examination:  General: Awake, alert, appears anxious  HEENT: Normal; sclera mildly injected Cardio: regular rate and rhythm, S1S2 heard, no murmurs appreciated Pulm: Global  inspiratory next Tory wheezes.  Air movement good.  Normal work of breathing on room air.  No dyspnea with speech. Extremities: Trace pitting edema noted to lower shins  Assessment/ Plan: 59 y.o. male   1. Bronchitis with bronchospasm Personal review of chest x-ray did show some haziness at the right medial base.  Unsure if this  is just cardiac shadowing versus an early pneumonia.  He certainly has global expiratory and mild inspiratory wheezes noted.  I still think this is primarily a bronchitis with bronchospasm and have given him an inhaled corticosteroid.  We discussed how to use this inhaler.  Okay to continue albuterol.  Differential diagnosis include CHF exacerbation but there was no significant fluid overload on exam and his lung exam was not consistent with this.  His cough medication was renewed.  We will plan for a Z-Pak pending chest x-ray results and if he has no significant improvement come Friday.  I will check him again in 48 hours.  2. Cough As above - DG Chest 2 View   Orders Placed This Encounter  Procedures  . DG Chest 2 View    Order Specific Question:   Reason for Exam (SYMPTOM  OR DIAGNOSIS REQUIRED)    Answer:   cough    Order Specific Question:   Preferred imaging location?    Answer:   Internal   No orders of the defined types were placed in this encounter.    Janora Norlander, DO [place] [phone]

## 2019-12-26 ENCOUNTER — Ambulatory Visit (INDEPENDENT_AMBULATORY_CARE_PROVIDER_SITE_OTHER): Payer: Commercial Managed Care - PPO | Admitting: Family Medicine

## 2019-12-26 VITALS — HR 84

## 2019-12-26 DIAGNOSIS — J209 Acute bronchitis, unspecified: Secondary | ICD-10-CM

## 2019-12-26 DIAGNOSIS — Z720 Tobacco use: Secondary | ICD-10-CM | POA: Diagnosis not present

## 2019-12-26 MED ORDER — BREZTRI AEROSPHERE 160-9-4.8 MCG/ACT IN AERO: 2.0000 | INHALATION_SPRAY | Freq: Two times a day (BID) | RESPIRATORY_TRACT | 12 refills | Status: DC

## 2019-12-26 NOTE — Progress Notes (Signed)
Subjective: CC: 48-hour follow-up for bronchospasm PCP: Janora Norlander, DO JJH:ERDEYCX [name] is a 59 y.o. male presenting to clinic today for:  1.  Bronchospasm with bronchitis Patient has responded very well to the corticosteroid injection and Breztri inhaler.  He has had no issues with thrush symptoms.  He reports that he is able to sleep at nighttime without difficulty now.  Continues to have some congestion that is most prevalent about mid morning.  He is using Mucinex.  He admits that he is not drinking sufficiently.  He also is unable to run humidifier due to driving around in his car for work.  No fevers.  No hemoptysis.   ROS: Per HPI  No Known Allergies Past Medical History:  Diagnosis Date   Allergy    occasionally    CAD in native artery    a. 2008 - Single-vessel CAD with stent placement to the circumflex  b. 2012 - DES to Diag.  c. 06/2014 - RCA 25% stenosis, LAD 30% stenosis, ramus intermediate 45% stenosis, D1 95% stenosis treated with DES  d.01/2015 - nonobstructive CAD   GERD (gastroesophageal reflux disease)    Hyperlipidemia, mixed    Hypertension    Myocardial infarction ([place]) 2008   Overweight(278.02)    Sleep apnea    CPAP    Current Outpatient Medications:    albuterol (VENTOLIN HFA) 108 (90 Base) MCG/ACT inhaler, Inhale 2 puffs into the lungs every 6 (six) hours as needed for wheezing or shortness of breath., Disp: 8 g, Rfl: 0   amLODipine (NORVASC) 10 MG tablet, Take 1 tablet (10 mg total) by mouth daily., Disp: 90 tablet, Rfl: 3   aspirin 81 MG chewable tablet, Chew 81 mg by mouth daily., Disp: , Rfl:    atorvastatin (LIPITOR) 40 MG tablet, TAKE 1 TABLET BY MOUTH AT 6 IN THE EVENING (Patient taking differently: Take 40 mg by mouth every evening. TAKE 1 TABLET BY MOUTH AT 6 IN THE EVENING), Disp: 90 tablet, Rfl: 3   Budeson-Glycopyrrol-Formoterol (BREZTRI AEROSPHERE) 160-9-4.8 MCG/ACT AERO, Inhale 2 puffs into the lungs in the morning  and at bedtime., Disp: 5.9 g, Rfl: 0   buPROPion (WELLBUTRIN XL) 150 MG 24 hr tablet, TAKE 1 TABLET BY MOUTH ONCE DAILY IN THE MORNING . APPOINTMENT REQUIRED FOR FUTURE REFILLS, Disp: 30 tablet, Rfl: 0   cefdinir (OMNICEF) 300 MG capsule, Take 1 capsule (300 mg total) by mouth 2 (two) times daily. 1 po BID, Disp: 20 capsule, Rfl: 0   docusate sodium (COLACE) 100 MG capsule, Take 1 capsule (100 mg total) by mouth 2 (two) times daily as needed for mild constipation (narcotic)., Disp: 60 capsule, Rfl: 2   guaiFENesin-codeine 100-10 MG/5ML syrup, Take 5 mLs by mouth every 6 (six) hours as needed for cough., Disp: 120 mL, Rfl: 0   hydrochlorothiazide (HYDRODIURIL) 25 MG tablet, Take 1 tablet by mouth once daily, Disp: 90 tablet, Rfl: 0   lisinopril (ZESTRIL) 40 MG tablet, Take 1 tablet (40 mg total) by mouth daily., Disp: 90 tablet, Rfl: 3   metoprolol tartrate (LOPRESSOR) 50 MG tablet, Take 1 tablet (50 mg total) by mouth 2 (two) times daily., Disp: 180 tablet, Rfl: 3   nitroGLYCERIN (NITROSTAT) 0.4 MG SL tablet, Place 1 tablet (0.4 mg total) under the tongue every 5 (five) minutes as needed. May repeat for up to 3 doses. (Patient taking differently: Place 0.4 mg under the tongue every 5 (five) minutes x 3 doses as needed for chest pain. ),  Disp: 25 tablet, Rfl: prn   ondansetron (ZOFRAN) 4 MG tablet, Take 1 tablet (4 mg total) by mouth every 8 (eight) hours as needed., Disp: 30 tablet, Rfl: 1 Social History   Socioeconomic History   Marital status: Married    Spouse name: Not on file   Number of children: Not on file   Years of education: Not on file   Highest education level: Not on file  Occupational History   Occupation: Full time    Employer: [employer]  Tobacco Use   Smoking status: Former Smoker    Start date: 03/20/2012    Quit date: 01/30/2017    Years since quitting: 2.9   Smokeless tobacco: Never Used  Vaping Use   Vaping Use: Never used  Substance and  Sexual Activity   Alcohol use: Yes    Alcohol/week: 0.0 standard drinks    Comment: occ   Drug use: No   Sexual activity: Not on file  Other Topics Concern   Not on file  Social History Narrative   Married   No regular exercise   Social Determinants of Health   Financial Resource Strain:    Difficulty of Paying Living Expenses: Not on file  Food Insecurity:    Worried About Charity fundraiser in the Last Year: Not on file   [employer] of Food in the Last Year: Not on file  Transportation Needs:    Lack of Transportation (Medical): Not on file   Lack of Transportation (Non-Medical): Not on file  Physical Activity:    Days of Exercise per Week: Not on file   Minutes of Exercise per Session: Not on file  Stress:    Feeling of Stress : Not on file  Social Connections:    Frequency of Communication with Friends and Family: Not on file   Frequency of Social Gatherings with Friends and Family: Not on file   Attends Religious Services: Not on file   Active Member of Clubs or Organizations: Not on file   Attends Archivist Meetings: Not on file   Marital Status: Not on file  Intimate Partner Violence:    Fear of Current or Ex-Partner: Not on file   Emotionally Abused: Not on file   Physically Abused: Not on file   Sexually Abused: Not on file   Family History  Problem Relation Age of Onset   Heart disease Mother    Colon polyps Father    Coronary artery disease Other    Colon polyps Sister    Colon cancer Neg Hx    Esophageal cancer Neg Hx    Rectal cancer Neg Hx    Stomach cancer Neg Hx     Objective: Office vital signs reviewed. Pulse 84    SpO2 95%   Physical Examination:  General: Awake, alert, appears much more comfortable Cardio: regular rate and rhythm, S1S2 heard, no murmurs appreciated Pulm: mild inspiratory and expiratory wheezes.  Movement is much better than previous.  No dyspnea with speech.  Assessment/ Plan: 59  y.o. male   1. Bronchitis with bronchospasm Improving subjectively and objectively.  Symptoms were likely triggered by a viral illness as there is been no evidence of pneumonia on his exam or chest x-rays.  I think that he likely has an undiagnosed COPD that was exacerbated.  I offered referral to pulmonology but he would like to hold off on this for now.  Smoking cessation encouraged.  Another sample of Breztri given. Coupon provided.  Follow up prn - Budeson-Glycopyrrol-Formoterol (BREZTRI AEROSPHERE) 160-9-4.8 MCG/ACT AERO; Inhale 2 puffs into the lungs in the morning and at bedtime.  Dispense: 10.7 g; Refill: 12  2. Tobacco use Contemplative  - Budeson-Glycopyrrol-Formoterol (BREZTRI AEROSPHERE) 160-9-4.8 MCG/ACT AERO; Inhale 2 puffs into the lungs in the morning and at bedtime.  Dispense: 10.7 g; Refill: 12   No orders of the defined types were placed in this encounter.  No orders of the defined types were placed in this encounter.    Janora Norlander, DO [place] [phone]

## 2019-12-28 ENCOUNTER — Other Ambulatory Visit: Payer: Self-pay | Admitting: Family Medicine

## 2019-12-28 DIAGNOSIS — I2510 Atherosclerotic heart disease of native coronary artery without angina pectoris: Secondary | ICD-10-CM

## 2020-01-04 ENCOUNTER — Other Ambulatory Visit: Payer: Self-pay | Admitting: Family Medicine

## 2020-01-04 DIAGNOSIS — I10 Essential (primary) hypertension: Secondary | ICD-10-CM

## 2020-01-25 ENCOUNTER — Other Ambulatory Visit: Payer: Self-pay | Admitting: Family Medicine

## 2020-01-25 DIAGNOSIS — I2510 Atherosclerotic heart disease of native coronary artery without angina pectoris: Secondary | ICD-10-CM

## 2020-01-30 ENCOUNTER — Other Ambulatory Visit: Payer: Self-pay | Admitting: Family Medicine

## 2020-01-30 ENCOUNTER — Other Ambulatory Visit: Payer: Self-pay | Admitting: *Deleted

## 2020-01-30 DIAGNOSIS — I10 Essential (primary) hypertension: Secondary | ICD-10-CM

## 2020-01-30 MED ORDER — AMLODIPINE BESYLATE 10 MG PO TABS: 10.0000 mg | ORAL_TABLET | Freq: Every day | ORAL | 3 refills | Status: DC

## 2020-02-04 ENCOUNTER — Other Ambulatory Visit: Payer: Self-pay | Admitting: Family Medicine

## 2020-02-04 DIAGNOSIS — I10 Essential (primary) hypertension: Secondary | ICD-10-CM

## 2020-02-16 ENCOUNTER — Ambulatory Visit: Payer: Commercial Managed Care - PPO | Admitting: Nurse Practitioner

## 2020-02-17 ENCOUNTER — Encounter: Payer: Self-pay | Admitting: Nurse Practitioner

## 2020-02-17 ENCOUNTER — Other Ambulatory Visit: Payer: Self-pay

## 2020-02-17 ENCOUNTER — Ambulatory Visit (INDEPENDENT_AMBULATORY_CARE_PROVIDER_SITE_OTHER): Payer: Commercial Managed Care - PPO | Admitting: Nurse Practitioner

## 2020-02-17 DIAGNOSIS — Z024 Encounter for examination for driving license: Secondary | ICD-10-CM

## 2020-02-17 DIAGNOSIS — Z0289 Encounter for other administrative examinations: Secondary | ICD-10-CM

## 2020-02-17 LAB — URINALYSIS
Bilirubin, UA: NEGATIVE
Glucose, UA: NEGATIVE
Ketones, UA: NEGATIVE
Leukocytes,UA: NEGATIVE
Nitrite, UA: NEGATIVE
Protein,UA: NEGATIVE
RBC, UA: NEGATIVE
Specific Gravity, UA: 1.02 (ref 1.005–1.030)
Urobilinogen, Ur: 0.2 mg/dL (ref 0.2–1.0)
pH, UA: 7 (ref 5.0–7.5)

## 2020-02-17 NOTE — Progress Notes (Signed)
Patient ID: Scott Hatfield, male   DOB: 29-Nov-1960, 60 y.o.   MRN: [id number] [employer]- see scanned in document

## 2020-03-03 ENCOUNTER — Other Ambulatory Visit: Payer: Self-pay

## 2020-03-03 ENCOUNTER — Ambulatory Visit (HOSPITAL_COMMUNITY)
Admission: RE | Admit: 2020-03-03 | Discharge: 2020-03-03 | Disposition: A | Discharge status: Home / Self Care | Payer: Commercial Managed Care - PPO | Source: Ambulatory Visit | Attending: Family Medicine | Admitting: Family Medicine

## 2020-03-03 ENCOUNTER — Ambulatory Visit: Payer: Commercial Managed Care - PPO | Admitting: Family Medicine

## 2020-03-03 ENCOUNTER — Encounter: Payer: Self-pay | Admitting: Family Medicine

## 2020-03-03 VITALS — BP 138/78 | HR 89 | Temp 97.8°F | Ht 72.0 in | Wt 294.0 lb

## 2020-03-03 DIAGNOSIS — M25561 Pain in right knee: Secondary | ICD-10-CM

## 2020-03-03 DIAGNOSIS — M7989 Other specified soft tissue disorders: Secondary | ICD-10-CM | POA: Diagnosis present

## 2020-03-03 MED ORDER — KNEE BRACE MISC: 0 refills | Status: DC

## 2020-03-03 MED ORDER — NAPROXEN 500 MG PO TABS: 500.0000 mg | ORAL_TABLET | Freq: Two times a day (BID) | ORAL | 0 refills | Status: DC

## 2020-03-03 NOTE — Patient Instructions (Signed)
You have prescribed a nonsteroidal anti-inflammatory drug (NSAID) today. This will help with your pain and inflammation. Please do not take any other NSAIDs (ibuprofen/Motrin/Advil, naproxen/Aleve, meloxicam/Mobic, Voltaren/diclofenac). Please make sure to eat a meal when taking this medication.   Caution:  If you have a history of acid reflux/indigestion, I recommend that you take an antacid (such as Prilosec, Prevacid) daily while on the NSAID.  If you have a history of bleeding disorder, gastric ulcer, are on a blood thinner (like warfarin/Coumadin, Xarelto, Eliquis, etc) please do not take NSAID.  If you have ever had a heart attack, you should not take NSAIDs.  

## 2020-03-03 NOTE — Progress Notes (Signed)
Subjective: CC: knee pain PCP: Janora Norlander, DO PYP:PJKDTOI Scott Hatfield is a 60 y.o. male presenting to clinic today for:  1. Knee pain Patient reports cute onset of right knee pain and swelling after he had groin surgery.  He notes that it seemed to gradually but has gradually gotten larger and now is having pain that radiates from the medial knee to the posterior aspect of the knee and somewhat down into the upper calf.  He is unable to extend or bend his knee totally due to this pain and swelling.  He has been using Motrin 200 mg up to twice daily with minimal relief.  No ice, topicals or compression.  No preceding injury.  He did alter his gait some when he had the lesions on the groin and after the surgery but has since gone back to walking normally.  He does feel that the knee feels like it wants to buckle sometimes.  Denies any warmth or erythema   ROS: Per HPI  No Known Allergies Past Medical History:  Diagnosis Date  . Allergy    occasionally   . CAD in native artery    a. 2008 - Single-vessel CAD with stent placement to the circumflex  b. 2012 - DES to Diag.  Scott. 06/2014 - RCA 25% stenosis, LAD 30% stenosis, ramus intermediate 45% stenosis, D1 95% stenosis treated with DES  d.01/2015 - nonobstructive CAD  . GERD (gastroesophageal reflux disease)   . Hyperlipidemia, mixed   . Hypertension   . Myocardial infarction ([place]) 2008  . Overweight(278.02)   . Sleep apnea    CPAP    Current Outpatient Medications:  .  albuterol (VENTOLIN HFA) 108 (90 Base) MCG/ACT inhaler, Inhale 2 puffs into the lungs every 6 (six) hours as needed for wheezing or shortness of breath., Disp: 8 g, Rfl: 0 .  amLODipine (NORVASC) 10 MG tablet, Take 1 tablet (10 mg total) by mouth daily. (Needs to be seen before next refill), Disp: 30 tablet, Rfl: 3 .  aspirin 81 MG chewable tablet, Chew 81 mg by mouth daily., Disp: , Rfl:  .  atorvastatin (LIPITOR) 40 MG tablet, TAKE 1 TABLET BY MOUTH IN THE EVENING  AT 6PM., Disp: 30 tablet, Rfl: 0 .  Budeson-Glycopyrrol-Formoterol (BREZTRI AEROSPHERE) 160-9-4.8 MCG/ACT AERO, Inhale 2 puffs into the lungs in the morning and at bedtime., Disp: 10.7 g, Rfl: 12 .  buPROPion (WELLBUTRIN XL) 150 MG 24 hr tablet, TAKE 1 TABLET BY MOUTH ONCE DAILY IN THE MORNING . APPOINTMENT REQUIRED FOR FUTURE REFILLS, Disp: 30 tablet, Rfl: 0 .  cefdinir (OMNICEF) 300 MG capsule, Take 1 capsule (300 mg total) by mouth 2 (two) times daily. 1 po BID, Disp: 20 capsule, Rfl: 0 .  docusate sodium (COLACE) 100 MG capsule, Take 1 capsule (100 mg total) by mouth 2 (two) times daily as needed for mild constipation (narcotic)., Disp: 60 capsule, Rfl: 2 .  guaiFENesin-codeine 100-10 MG/5ML syrup, Take 5 mLs by mouth every 6 (six) hours as needed for cough., Disp: 120 mL, Rfl: 0 .  hydrochlorothiazide (HYDRODIURIL) 25 MG tablet, Take 1 tablet by mouth once daily, Disp: 90 tablet, Rfl: 0 .  lisinopril (ZESTRIL) 40 MG tablet, Take 1 tablet by mouth once daily, Disp: 90 tablet, Rfl: 0 .  metoprolol tartrate (LOPRESSOR) 50 MG tablet, Take 1 tablet (50 mg total) by mouth 2 (two) times daily., Disp: 180 tablet, Rfl: 3 .  nitroGLYCERIN (NITROSTAT) 0.4 MG SL tablet, Place 1 tablet (0.4 mg  total) under the tongue every 5 (five) minutes as needed. May repeat for up to 3 doses. (Patient taking differently: Place 0.4 mg under the tongue every 5 (five) minutes x 3 doses as needed for chest pain. ), Disp: 25 tablet, Rfl: prn .  ondansetron (ZOFRAN) 4 MG tablet, Take 1 tablet (4 mg total) by mouth every 8 (eight) hours as needed., Disp: 30 tablet, Rfl: 1 Social History   Socioeconomic History  . Marital status: Married    Spouse name: Not on file  . Number of children: Not on file  . Years of education: Not on file  . Highest education level: Not on file  Occupational History  . Occupation: Full time    Employer: [employer]  Tobacco Use  . Smoking status: Former Smoker    Start date: 03/20/2012     Quit date: 01/30/2017    Years since quitting: 3.0  . Smokeless tobacco: Never Used  Vaping Use  . Vaping Use: Never used  Substance and Sexual Activity  . Alcohol use: Yes    Alcohol/week: 0.0 standard drinks    Comment: occ  . Drug use: No  . Sexual activity: Not on file  Other Topics Concern  . Not on file  Social History Narrative   Married   No regular exercise   Social Determinants of Health   Financial Resource Strain: Not on file  Food Insecurity: Not on file  Transportation Needs: Not on file  Physical Activity: Not on file  Stress: Not on file  Social Connections: Not on file  Intimate Partner Violence: Not on file   Family History  Problem Relation Age of Onset  . Heart disease Mother   . Colon polyps Father   . Coronary artery disease Other   . Colon polyps Sister   . Colon cancer Neg Hx   . Esophageal cancer Neg Hx   . Rectal cancer Neg Hx   . Stomach cancer Neg Hx     Objective: Office vital signs reviewed. BP 138/78   Pulse 89   Temp 97.8 F (36.6 Scott) (Temporal)   Ht 6' (1.829 m)   Wt 294 lb (133.4 kg)   SpO2 96%   BMI 39.87 kg/m   Physical Examination:  General: Awake, alert, obese, No acute distress HEENT: Normal, sclera injected MSK: Antalgic gait but ambulates independently   Right knee: He has about a 5 to 10 degree loss in extension and is only able to flex his knee about 90 degrees.  There is a rounded soft tissue palpable mass noted at the medial knee on the right.  There is no significant tenderness but there is mild erythema and warmth.  There is no palpable abnormalities in the posterior popliteal fossa, calf.  No significant calf circumference difference.  Negative Homans.  No palpable cords in the posterior calf.  Pain is not reproducible with [place]ssaly  Assessment/ Plan: 60 y.o. male   Acute pain of right knee - Plan: naproxen (NAPROSYN) 500 MG tablet, Elastic Bandages & Supports (KNEE BRACE) MISC, [place] RT LOWER EXTREM LTD SOFT  TISSUE NON VASCULAR, US Venous Img Lower Unilateral Right  Mass of soft tissue of knee - Plan: [place] RT LOWER EXTREM LTD SOFT TISSUE NON VASCULAR, US Venous Img Lower Unilateral Right  Uncertain etiology but there is certainly a palpable soft tissue mass noted in the posterior medial aspect of the knee.  Initially I thought this might be a Baker's cyst but the location of the  lesion is somewhat unusual.  I have placed an urgent order for soft tissue ultrasound of this lesion.  If that is unrevealing proceed with vascular imaging given that this lesion started after he completed a surgical procedure.  His score for DVT is 0 at this point so I think it is unlikely to be DVT but would like to rule this out if the soft tissues unrevealing  No orders of the defined types were placed in this encounter.  No orders of the defined types were placed in this encounter.    Janora Norlander, DO [place] [phone]

## 2020-03-05 ENCOUNTER — Other Ambulatory Visit: Payer: Self-pay | Admitting: Family Medicine

## 2020-03-05 DIAGNOSIS — R2241 Localized swelling, mass and lump, right lower limb: Secondary | ICD-10-CM

## 2020-03-07 ENCOUNTER — Other Ambulatory Visit: Payer: Self-pay | Admitting: Family Medicine

## 2020-03-07 DIAGNOSIS — I2510 Atherosclerotic heart disease of native coronary artery without angina pectoris: Secondary | ICD-10-CM

## 2020-03-15 ENCOUNTER — Ambulatory Visit: Payer: Commercial Managed Care - PPO | Admitting: Family Medicine

## 2020-03-15 ENCOUNTER — Encounter: Payer: Self-pay | Admitting: Nurse Practitioner

## 2020-03-15 ENCOUNTER — Other Ambulatory Visit: Payer: Self-pay

## 2020-03-15 VITALS — BP 124/75 | HR 97 | Temp 98.3°F | Ht 72.0 in | Wt 295.0 lb

## 2020-03-15 DIAGNOSIS — K1120 Sialoadenitis, unspecified: Secondary | ICD-10-CM

## 2020-03-15 DIAGNOSIS — M7989 Other specified soft tissue disorders: Secondary | ICD-10-CM

## 2020-03-15 DIAGNOSIS — M25561 Pain in right knee: Secondary | ICD-10-CM

## 2020-03-15 MED ORDER — TRAMADOL HCL 50 MG PO TABS: 50.0000 mg | ORAL_TABLET | Freq: Three times a day (TID) | ORAL | 0 refills | Status: AC | PRN

## 2020-03-15 MED ORDER — CEPHALEXIN 500 MG PO CAPS: 500.0000 mg | ORAL_CAPSULE | Freq: Four times a day (QID) | ORAL | 0 refills | Status: AC

## 2020-03-15 NOTE — Progress Notes (Signed)
Subjective: CC: Facial swelling PCP: Janora Norlander, DO BJY:NWGNFAO [name] is a 60 y.o. male presenting to clinic today for:  1.  Facial swelling/ right knee pain Patient is had several day history of left-sided facial swelling that seems to be exacerbated by consumption of foods.  He contacted me over the weekend and I was able to instruct him to start using warm compresses and increasing sour foods and water.  Today, the swelling does look somewhat better.  He does note that last night he saw a couple of droplets of blood on his pillow and he is not sure quite where this came from.  He denies any fevers, nausea or vomiting.  He is able to open and close his mouth.  He has been compliant with the warm compresses and other recommendations.  Has been using the NSAID for his right knee.  This also continues to bother him quite a bit.  He is not able to get his MRI until Friday.  He is asking for assistance with that.   ROS: Per HPI  No Known Allergies Past Medical History:  Diagnosis Date  . Allergy    occasionally   . CAD in native artery    a. 2008 - Single-vessel CAD with stent placement to the circumflex  b. 2012 - DES to Diag.  c. 06/2014 - RCA 25% stenosis, LAD 30% stenosis, ramus intermediate 45% stenosis, D1 95% stenosis treated with DES  d.01/2015 - nonobstructive CAD  . GERD (gastroesophageal reflux disease)   . Hyperlipidemia, mixed   . Hypertension   . Myocardial infarction ([place]) 2008  . Overweight(278.02)   . Sleep apnea    CPAP    Current Outpatient Medications:  .  albuterol (VENTOLIN HFA) 108 (90 Base) MCG/ACT inhaler, Inhale 2 puffs into the lungs every 6 (six) hours as needed for wheezing or shortness of breath., Disp: 8 g, Rfl: 0 .  amLODipine (NORVASC) 10 MG tablet, Take 1 tablet (10 mg total) by mouth daily. (Needs to be seen before next refill), Disp: 30 tablet, Rfl: 3 .  aspirin 81 MG chewable tablet, Chew 81 mg by mouth daily., Disp: , Rfl:  .   atorvastatin (LIPITOR) 40 MG tablet, TAKE 1 TABLET BY MOUTH IN THE EVENING AT  6  PM (Needs to be seen before next refill), Disp: 30 tablet, Rfl: 0 .  Budeson-Glycopyrrol-Formoterol (BREZTRI AEROSPHERE) 160-9-4.8 MCG/ACT AERO, Inhale 2 puffs into the lungs in the morning and at bedtime., Disp: 10.7 g, Rfl: 12 .  buPROPion (WELLBUTRIN XL) 150 MG 24 hr tablet, TAKE 1 TABLET BY MOUTH ONCE DAILY IN THE MORNING . APPOINTMENT REQUIRED FOR FUTURE REFILLS, Disp: 30 tablet, Rfl: 0 .  cephALEXin (KEFLEX) 500 MG capsule, Take 1 capsule (500 mg total) by mouth 4 (four) times daily for 7 days., Disp: 28 capsule, Rfl: 0 .  docusate sodium (COLACE) 100 MG capsule, Take 1 capsule (100 mg total) by mouth 2 (two) times daily as needed for mild constipation (narcotic)., Disp: 60 capsule, Rfl: 2 .  Elastic Bandages & Supports (KNEE BRACE) MISC, Soft knee brace, UAD for compression on right knee., Disp: 1 each, Rfl: 0 .  hydrochlorothiazide (HYDRODIURIL) 25 MG tablet, Take 1 tablet by mouth once daily, Disp: 90 tablet, Rfl: 0 .  lisinopril (ZESTRIL) 40 MG tablet, Take 1 tablet by mouth once daily, Disp: 90 tablet, Rfl: 0 .  metoprolol tartrate (LOPRESSOR) 50 MG tablet, Take 1 tablet (50 mg total) by mouth 2 (two)  times daily., Disp: 180 tablet, Rfl: 3 .  naproxen (NAPROSYN) 500 MG tablet, Take 1 tablet (500 mg total) by mouth 2 (two) times daily with a meal. (if needed for pain/ swelling), Disp: 30 tablet, Rfl: 0 .  nitroGLYCERIN (NITROSTAT) 0.4 MG SL tablet, Place 1 tablet (0.4 mg total) under the tongue every 5 (five) minutes as needed. May repeat for up to 3 doses. (Patient taking differently: Place 0.4 mg under the tongue every 5 (five) minutes x 3 doses as needed for chest pain.), Disp: 25 tablet, Rfl: prn .  ondansetron (ZOFRAN) 4 MG tablet, Take 1 tablet (4 mg total) by mouth every 8 (eight) hours as needed., Disp: 30 tablet, Rfl: 1 Social History   Socioeconomic History  . Marital status: Married    Spouse name:  Not on file  . Number of children: Not on file  . Years of education: Not on file  . Highest education level: Not on file  Occupational History  . Occupation: Full time    Employer: [employer]  Tobacco Use  . Smoking status: Former Smoker    Start date: 03/20/2012    Quit date: 01/30/2017    Years since quitting: 3.1  . Smokeless tobacco: Never Used  Vaping Use  . Vaping Use: Never used  Substance and Sexual Activity  . Alcohol use: Yes    Alcohol/week: 0.0 standard drinks    Comment: occ  . Drug use: No  . Sexual activity: Not on file  Other Topics Concern  . Not on file  Social History Narrative   Married   No regular exercise   Social Determinants of Health   Financial Resource Strain: Not on file  Food Insecurity: Not on file  Transportation Needs: Not on file  Physical Activity: Not on file  Stress: Not on file  Social Connections: Not on file  Intimate Partner Violence: Not on file   Family History  Problem Relation Age of Onset  . Heart disease Mother   . Colon polyps Father   . Coronary artery disease Other   . Colon polyps Sister   . Colon cancer Neg Hx   . Esophageal cancer Neg Hx   . Rectal cancer Neg Hx   . Stomach cancer Neg Hx     Objective: Office vital signs reviewed. BP 124/75   Pulse 97   Temp 98.3 F (36.8 C)   Ht 6' (1.829 m)   Wt 295 lb (133.8 kg)   SpO2 96%   BMI 40.01 kg/m   Physical Examination:  General: Awake, alert, well nourished, No acute distress HEENT: Mild swelling over the parotid salivary gland area on the left.  He has mild tenderness to palpation over this area.  No overt warmth or erythema.  TMs were examined bilaterally and he does have some bubbling noted behind the left TM.  Uncertain as to whether or not he also has an ear infection or if this is fluid buildup due to inflammation around the parotid gland.  It does not appear to be overt purulent fluid within the TM.  No perforation, erythema or bulging MSK:  Antalgic gait.  Ambulating independently  Assessment/ Plan: 60 y.o. male   Salivary gland infection - Plan: cephALEXin (KEFLEX) 500 MG capsule  Mass of soft tissue of knee  Acute pain of right knee  I suspect that he has had at least a salivary gland stone obstruction.  I am empirically treating for infection given reports of blood on his  pillow.  Though this may have represented his stone having loosened.  He does have mild swelling noted on today's exam but no overt purulence.  He is afebrile.  Continue NSAID.  I have added tramadol for the knee pain and to help with the above.  Keflex 4 times daily prescribed.  We discussed return in 7 days if symptoms are worsening or not improving.  He is to contact me earlier than that if either of these arise.  We would need to proceed with CT scan in that case. Continue warm compresses several times daily.  Continue sour foods and drinks in efforts to promote salivary flow.  Hydrate adequately  MRI is scheduled for Friday.  We will plan pending that result.  The Narcotic Database has been reviewed.  There were no red flags.     No orders of the defined types were placed in this encounter.  Meds ordered this encounter  Medications  . cephALEXin (KEFLEX) 500 MG capsule    Sig: Take 1 capsule (500 mg total) by mouth 4 (four) times daily for 7 days.    Dispense:  28 capsule    Refill:  0  . traMADol (ULTRAM) 50 MG tablet    Sig: Take 1 tablet (50 mg total) by mouth every 8 (eight) hours as needed for up to 5 days.    Dispense:  15 tablet    Refill:  Icard, DO [place] [phone]

## 2020-03-15 NOTE — Patient Instructions (Signed)
If no better in 1 week, please call for appt to be reevaluated.  Salivary Gland Infection  A salivary gland infection is an infection in one or more of the glands that produce saliva. You have six major salivary glands. Each gland has a duct that carries saliva into your mouth. Saliva keeps your mouth moist and breaks down the food that you eat. It also helps prevent tooth decay. Two salivary glands are located just in front of your ears (parotid). The ducts for these glands open up inside your cheeks, near your back teeth. You also have two glands under your tongue (sublingual) and two glands under your jaw (submandibular). The ducts for these glands open under your tongue. Any salivary gland can become infected. Most infections occur in the parotid glands or submandibular glands. What are the causes? This condition may be caused by bacteria or viruses.  The bacteria that cause salivary gland infections are usually the same bacteria that normally live in your mouth. ? A stone can form in a salivary gland and block the flow of saliva. As a result, saliva backs up into the salivary gland. Bacteria may then start to grow behind the blockage and cause infection. ? Bacterial infections usually cause pain and swelling on one side of the face. Submandibular gland swelling occurs under the jaw. Parotid swelling occurs in front of the ear. ? Bacterial infections are more common in adults.  The mumps virus is the most common cause of viral salivary gland infections. However, mumps is now rare because of vaccination. ? This infection causes swelling in both parotid glands. ? Viral infections are more common in children. What increases the risk? The following factors may make you more likely to develop a bacterial infection:  Not taking good care of your mouth and teeth (poor oral hygiene).  Smoking.  Not drinking enough water.  Having a disease that causes dry mouth and dry eyes (Mikulicz syndrome or  Sjogren syndrome). A viral infection is more likely to occur in children who do not get the MMR (measles, mumps, rubella) vaccine. What are the signs or symptoms? The main sign of a salivary gland infection is a swollen salivary gland. This type of inflammation is often called sialadenitis. You may have swelling in front of your ear, under your jaw, or under your tongue. Swelling may get worse when you eat and decrease after you eat. Other signs and symptoms include:  Pain.  Tenderness.  Redness.  Dry mouth.  Bad taste in your mouth.  Difficulty chewing and swallowing.  Fever. How is this diagnosed? This condition may be diagnosed based on:  Your signs and symptoms.  A physical exam. During the exam, your health care provider will look and feel inside your mouth to see whether a stone is blocking a salivary gland duct.  Tests, such as: ? An X-ray to check for a stone. ? An ultrasound, CT scan, or MRI to look for an abscess and to rule out other causes of swelling. ? A culture and sensitivity test. In this test, a sample of pus is taken from the salivary gland with a swab or by using a needle (aspiration). The sample is tested in a lab to determine the type of bacteria that is growing and which antibiotic medicines will work against it. You may need to see an ear, nose, and throat specialist (ENT or otolaryngologist) for diagnosis and treatment. How is this treated? Viral salivary gland infections usually clear up without treatment. Bacterial  infections are usually treated with antibiotic medicine. Severe infections that cause difficulty with swallowing may be treated with an IV antibiotic in the hospital. Other treatments may include:  Probing and widening the salivary duct to allow a stone to pass. In some cases, a thin, flexible scope (endoscope) may be inserted into the duct to find a stone and remove it.  Breaking up a stone using sound waves.  Draining an infected gland  (abscess) with a needle.  Surgery to: ? Remove a stone. ? Drain pus from an abscess. ? Remove a badly infected gland. Follow these instructions at home: Medicines  Take over-the-counter and prescription medicines only as told by your health care provider.  If you were prescribed an antibiotic medicine, take it as told by your health care provider. Do not stop taking the antibiotic even if you start to feel better. To relieve discomfort  Follow these instructions every few hours: ? Suck on a lemon candy to stimulate the flow of saliva. ? Put a warm compress over the gland. ? Gently massage the gland.  Rinse your mouth with a salt-water mixture 3-4 times a day or as needed. To make a salt-water mixture, completely dissolve -1 tsp of salt in 1 cup of warm water. General instructions  Practice good oral hygiene by brushing and flossing your teeth after meals and before you go to bed.  Drink enough fluid to keep your urine pale yellow.  Do not use any products that contain nicotine or tobacco, such as cigarettes and e-cigarettes. If you need help quitting, ask your health care provider.  Keep all follow-up visits as told by your health care provider. This is important.   Contact a health care provider if:  You have pain and swelling in your face, jaw, or mouth after eating.  You have persistent swelling in any of these places: ? In front of your ear. ? Under your jaw. ? Inside your mouth. Get help right away if:  You have pain and swelling in your face, jaw, or mouth, and this is getting worse.  Your pain and swelling make it hard to swallow or breathe. Summary  A salivary gland infection is an infection in one or more of the glands that produce saliva. You have six major salivary glands. Each gland has a duct that carries saliva into your mouth.  Any salivary gland can become infected. Most infections occur in the glands just in front of your ears (parotid glands) or the  glands under your jaw (submandibular glands).  This condition may be caused by bacteria or viruses.  Salivary gland infections caused by a virus usually clear up without treatment. Bacterial infections are usually treated with antibiotic medicine. This information is not intended to replace advice given to you by your health care provider. Make sure you discuss any questions you have with your health care provider. Document Revised: 02/28/2017 Document Reviewed: 02/28/2017 Elsevier Patient Education  2021 [employer].

## 2020-03-19 ENCOUNTER — Ambulatory Visit (HOSPITAL_COMMUNITY)
Admission: RE | Admit: 2020-03-19 | Discharge: 2020-03-19 | Disposition: A | Discharge status: Home / Self Care | Payer: Commercial Managed Care - PPO | Source: Ambulatory Visit | Attending: Family Medicine | Admitting: Family Medicine

## 2020-03-19 ENCOUNTER — Other Ambulatory Visit: Payer: Self-pay | Admitting: Family Medicine

## 2020-03-19 ENCOUNTER — Other Ambulatory Visit: Payer: Self-pay

## 2020-03-19 DIAGNOSIS — R2241 Localized swelling, mass and lump, right lower limb: Secondary | ICD-10-CM | POA: Diagnosis not present

## 2020-03-19 DIAGNOSIS — C499 Malignant neoplasm of connective and soft tissue, unspecified: Secondary | ICD-10-CM

## 2020-03-19 MED ORDER — GADOBUTROL 1 MMOL/ML IV SOLN
10.0000 mL | Freq: Once | INTRAVENOUS | Status: AC | PRN
  Administered 2020-03-19: 10 mL via INTRAVENOUS

## 2020-03-19 NOTE — Progress Notes (Unsigned)
Placed urgent referral for the patient due to a spot on his MRI that is a possible tumor. atypical lipomatous tumor

## 2020-03-21 ENCOUNTER — Other Ambulatory Visit: Payer: Self-pay | Admitting: Family Medicine

## 2020-03-21 DIAGNOSIS — I10 Essential (primary) hypertension: Secondary | ICD-10-CM

## 2020-04-11 ENCOUNTER — Other Ambulatory Visit: Payer: Self-pay | Admitting: Family Medicine

## 2020-04-11 DIAGNOSIS — I2510 Atherosclerotic heart disease of native coronary artery without angina pectoris: Secondary | ICD-10-CM

## 2020-04-12 NOTE — Telephone Encounter (Signed)
[place]. NTBS 30 days given 03/08/20

## 2020-04-30 ENCOUNTER — Other Ambulatory Visit: Payer: Self-pay | Admitting: Family Medicine

## 2020-04-30 DIAGNOSIS — I2510 Atherosclerotic heart disease of native coronary artery without angina pectoris: Secondary | ICD-10-CM

## 2020-05-03 NOTE — Telephone Encounter (Signed)
[place]. NTBS f30 days given 03/08/20

## 2020-05-06 ENCOUNTER — Other Ambulatory Visit: Payer: Self-pay | Admitting: Family Medicine

## 2020-05-06 DIAGNOSIS — I2510 Atherosclerotic heart disease of native coronary artery without angina pectoris: Secondary | ICD-10-CM

## 2020-05-23 ENCOUNTER — Other Ambulatory Visit: Payer: Self-pay | Admitting: Family Medicine

## 2020-05-23 DIAGNOSIS — I10 Essential (primary) hypertension: Secondary | ICD-10-CM

## 2020-05-31 ENCOUNTER — Encounter: Payer: Self-pay | Admitting: Family Medicine

## 2020-06-11 ENCOUNTER — Ambulatory Visit: Payer: Commercial Managed Care - PPO | Admitting: Family Medicine

## 2020-06-11 ENCOUNTER — Other Ambulatory Visit: Payer: Self-pay

## 2020-06-11 ENCOUNTER — Encounter: Payer: Self-pay | Admitting: Family Medicine

## 2020-06-11 VITALS — BP 140/85 | HR 83 | Temp 97.6°F | Ht 72.0 in | Wt 264.4 lb

## 2020-06-11 DIAGNOSIS — I10 Essential (primary) hypertension: Secondary | ICD-10-CM

## 2020-06-11 DIAGNOSIS — L732 Hidradenitis suppurativa: Secondary | ICD-10-CM

## 2020-06-11 DIAGNOSIS — C499 Malignant neoplasm of connective and soft tissue, unspecified: Secondary | ICD-10-CM | POA: Diagnosis not present

## 2020-06-11 DIAGNOSIS — I2510 Atherosclerotic heart disease of native coronary artery without angina pectoris: Secondary | ICD-10-CM

## 2020-06-11 DIAGNOSIS — R7303 Prediabetes: Secondary | ICD-10-CM

## 2020-06-11 DIAGNOSIS — Z720 Tobacco use: Secondary | ICD-10-CM

## 2020-06-11 DIAGNOSIS — Z9861 Coronary angioplasty status: Secondary | ICD-10-CM

## 2020-06-11 DIAGNOSIS — Z125 Encounter for screening for malignant neoplasm of prostate: Secondary | ICD-10-CM

## 2020-06-11 MED ORDER — NITROGLYCERIN 0.4 MG SL SUBL: 0.4000 mg | SUBLINGUAL_TABLET | SUBLINGUAL | 1 refills | Status: DC | PRN

## 2020-06-11 MED ORDER — CLINDAMYCIN PHOSPHATE 1 % EX SWAB
CUTANEOUS | 12 refills | Status: DC
Start: 2020-06-11 — End: 2020-11-25

## 2020-06-11 MED ORDER — BUPROPION HCL ER (XL) 150 MG PO TB24: ORAL_TABLET | ORAL | 3 refills | Status: DC

## 2020-06-11 MED ORDER — ATORVASTATIN CALCIUM 40 MG PO TABS: ORAL_TABLET | ORAL | 3 refills | Status: DC

## 2020-06-11 MED ORDER — HYDROCHLOROTHIAZIDE 25 MG PO TABS
1.0000 | ORAL_TABLET | Freq: Every day | ORAL | 3 refills | Status: DC
Start: 2020-06-11 — End: 2021-08-31

## 2020-06-11 MED ORDER — LISINOPRIL 40 MG PO TABS
40.0000 mg | ORAL_TABLET | Freq: Every day | ORAL | 3 refills | Status: DC
Start: 2020-06-11 — End: 2021-07-25

## 2020-06-11 MED ORDER — METOPROLOL TARTRATE 50 MG PO TABS: 50.0000 mg | ORAL_TABLET | Freq: Two times a day (BID) | ORAL | 3 refills | Status: DC

## 2020-06-11 MED ORDER — DOXYCYCLINE HYCLATE 100 MG PO TABS: 100.0000 mg | ORAL_TABLET | Freq: Two times a day (BID) | ORAL | 0 refills | Status: AC

## 2020-06-11 MED ORDER — AMLODIPINE BESYLATE 10 MG PO TABS: 10.0000 mg | ORAL_TABLET | Freq: Every day | ORAL | 3 refills | Status: DC

## 2020-06-11 NOTE — Progress Notes (Signed)
Subjective: CC: Follow-up hypertension, CAD, hyperlipidemia PCP: Janora Norlander, DO BDZ:[id number] [name] is a 60 y.o. male presenting to clinic today for:  1.  Hypertension with hyperlipidemia, CAD status post stent placement/tobacco use disorder Patient is compliant with Lipitor, aspirin, Norvasc, Lopressor, lisinopril and hydrochlorothiazide.  He notes that he discontinued the Lipitor only because he ran out of the medicine but would like to restart it.  No chest pain, shortness of breath, edema.  He has been actively working on weight loss and has been successful thus far dropping at least a couple of pant sizes.  He admits he continues to smoke.  Wellbutrin did seem to help reduce this smoking but he notes that he has been out of this medicine for a while and would like to restart.  No hemoptysis, unplanned weight loss, difficulty swallowing, globus sensation, wheezing.  2.  Recurrent boils Patient has yet another lesion on the inner right thigh that is developing.  He has had history of a couple of lesions removed from the scrotal area and perineum in the past due to infection and widespread cyst formation.  He notes that he was able to extract some clear, serosanguineous fluid from it yesterday.  No redness or warmth.  No ongoing active drainage.  Not currently using anything for treatment.  No fevers.  3.  Lipoma Patient with lesion on the medial aspect of his right knee.  He anticipates surgical removal on 06/21/2020.  He had preop questioning already done and testing will be soon.  Meniscal repair not planned at this time as he seemed to respond well to the corticosteroid that was injected.  He will be seeing Dr. Redmond Pulling for the above.   ROS: Per HPI  No Known Allergies Past Medical History:  Diagnosis Date  . Allergy    occasionally   . CAD in native artery    a. 2008 - Single-vessel CAD with stent placement to the circumflex  b. 2012 - DES to Diag.  c. 06/2014 - RCA 25%  stenosis, LAD 30% stenosis, ramus intermediate 45% stenosis, D1 95% stenosis treated with DES  d.01/2015 - nonobstructive CAD  . GERD (gastroesophageal reflux disease)   . Hyperlipidemia, mixed   . Hypertension   . Myocardial infarction ([place]) 2008  . Overweight(278.02)   . Sleep apnea    CPAP    Current Outpatient Medications:  .  albuterol (VENTOLIN HFA) 108 (90 Base) MCG/ACT inhaler, Inhale 2 puffs into the lungs every 6 (six) hours as needed for wheezing or shortness of breath., Disp: 8 g, Rfl: 0 .  amLODipine (NORVASC) 10 MG tablet, Take 1 tablet (10 mg total) by mouth daily. (Needs to be seen before next refill), Disp: 30 tablet, Rfl: 3 .  aspirin 81 MG chewable tablet, Chew 81 mg by mouth daily., Disp: , Rfl:  .  atorvastatin (LIPITOR) 40 MG tablet, TAKE 1 TABLET BY MOUTH IN THE EVENING AT  6  PM (Needs to be seen before next refill), Disp: 30 tablet, Rfl: 0 .  Budeson-Glycopyrrol-Formoterol (BREZTRI AEROSPHERE) 160-9-4.8 MCG/ACT AERO, Inhale 2 puffs into the lungs in the morning and at bedtime., Disp: 10.7 g, Rfl: 12 .  buPROPion (WELLBUTRIN XL) 150 MG 24 hr tablet, TAKE 1 TABLET BY MOUTH ONCE DAILY IN THE MORNING . APPOINTMENT REQUIRED FOR FUTURE REFILLS, Disp: 30 tablet, Rfl: 0 .  docusate sodium (COLACE) 100 MG capsule, Take 1 capsule (100 mg total) by mouth 2 (two) times daily as needed for mild  constipation (narcotic)., Disp: 60 capsule, Rfl: 2 .  Elastic Bandages & Supports (KNEE BRACE) MISC, Soft knee brace, UAD for compression on right knee., Disp: 1 each, Rfl: 0 .  hydrochlorothiazide (HYDRODIURIL) 25 MG tablet, Take 1 tablet by mouth once daily, Disp: 90 tablet, Rfl: 1 .  lisinopril (ZESTRIL) 40 MG tablet, Take 1 tablet (40 mg total) by mouth daily. (NEEDS TO BE SEEN BEFORE NEXT REFILL), Disp: 90 tablet, Rfl: 0 .  metoprolol tartrate (LOPRESSOR) 50 MG tablet, Take 1 tablet (50 mg total) by mouth 2 (two) times daily., Disp: 180 tablet, Rfl: 3 .  naproxen (NAPROSYN) 500 MG  tablet, Take 1 tablet (500 mg total) by mouth 2 (two) times daily with a meal. (if needed for pain/ swelling), Disp: 30 tablet, Rfl: 0 .  nitroGLYCERIN (NITROSTAT) 0.4 MG SL tablet, Place 1 tablet (0.4 mg total) under the tongue every 5 (five) minutes as needed. May repeat for up to 3 doses. (Patient taking differently: Place 0.4 mg under the tongue every 5 (five) minutes x 3 doses as needed for chest pain.), Disp: 25 tablet, Rfl: prn .  ondansetron (ZOFRAN) 4 MG tablet, Take 1 tablet (4 mg total) by mouth every 8 (eight) hours as needed., Disp: 30 tablet, Rfl: 1 Social History   Socioeconomic History  . Marital status: Married    Spouse name: Not on file  . Number of children: Not on file  . Years of education: Not on file  . Highest education level: Not on file  Occupational History  . Occupation: Full time    Employer: [employer]  Tobacco Use  . Smoking status: Former Smoker    Start date: 03/20/2012    Quit date: 01/30/2017    Years since quitting: 3.3  . Smokeless tobacco: Never Used  Vaping Use  . Vaping Use: Never used  Substance and Sexual Activity  . Alcohol use: Yes    Alcohol/week: 0.0 standard drinks    Comment: occ  . Drug use: No  . Sexual activity: Not on file  Other Topics Concern  . Not on file  Social History Narrative   Married   No regular exercise   Social Determinants of Health   Financial Resource Strain: Not on file  Food Insecurity: Not on file  Transportation Needs: Not on file  Physical Activity: Not on file  Stress: Not on file  Social Connections: Not on file  Intimate Partner Violence: Not on file   Family History  Problem Relation Age of Onset  . Heart disease Mother   . Colon polyps Father   . Coronary artery disease Other   . Colon polyps Sister   . Colon cancer Neg Hx   . Esophageal cancer Neg Hx   . Rectal cancer Neg Hx   . Stomach cancer Neg Hx     Objective: Office vital signs reviewed. BP 140/85   Pulse 83   Temp  97.6 F (36.4 C)   Ht 6' (1.829 m)   Wt 264 lb 6.4 oz (119.9 kg)   SpO2 94%   BMI 35.86 kg/m   Physical Examination:  General: Awake, alert, well appearing male, No acute distress HEENT: Normal, sclera mildly injected, anicteric Cardio: regular rate and rhythm, S1S2 heard, no murmurs appreciated Pulm: clear to auscultation bilaterally, no wheezes, rhonchi or rales; normal work of breathing on room air GI: soft, non-tender, non-distended, bowel sounds present x4, no hepatomegaly, no splenomegaly, no masses GU: Small, palpable cyst formation noted along the  right inner thigh.  There is no appreciable drainage, erythema, fluctuance.  Certainly no evidence of secondary infection. MSK: Ambulating independently and gait is not significantly antalgic today.  Assessment/ Plan: 60 y.o. 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  Hidradenitis suppurativa - Plan: clindamycin (CLEOCIN T) 1 % SWAB, doxycycline (VIBRA-TABS) 100 MG tablet  Essential hypertension - Plan: CMP14+EGFR, amLODipine (NORVASC) 10 MG tablet, hydrochlorothiazide (HYDRODIURIL) 25 MG tablet, lisinopril (ZESTRIL) 40 MG tablet, metoprolol tartrate (LOPRESSOR) 50 MG tablet  Tobacco use - Plan: buPROPion (WELLBUTRIN XL) 150 MG 24 hr tablet  Morbid obesity ([place]) - Plan: buPROPion (WELLBUTRIN XL) 150 MG 24 hr tablet  CAD- S/P PCI '08, 2012, Dx DES 07/03/14 - Plan: CMP14+EGFR, Lipid panel, TSH, amLODipine (NORVASC) 10 MG tablet, atorvastatin (LIPITOR) 40 MG tablet, metoprolol tartrate (LOPRESSOR) 50 MG tablet, nitroGLYCERIN (NITROSTAT) 0.4 MG SL tablet  Pre-diabetes - Plan: Bayer DCA Hb A1c Waived  Screening for malignant neoplasm of prostate - Plan: PSA  Plan for surgical resection of the lipomatous tumor.  Patient is moderate risk for a moderate risk surgery.  His modifiable risk factors include tobacco use disorder.  Pending cardiac clearance from his cardiologist, he is clear from my side from a medical standpoint.   Smoking cessation highly encouraged and we will restart Wellbutrin as a result  I suspect lesions are related to hidradenitis suppurativa.  I put him on Cleocin applied twice daily to the affected lesions.  Doxycycline home prescription provided in case symptoms progress over the weekend.  I certainly would not want his surgery to be delayed as a result of this cyst.  Lipitor, lisinopril, beta-blocker, amlodipine and hydrochlorothiazide renewed.  I have also renewed his nitro as he is unsure as to whether or not it is expired.  Will CC his chart to primary cardiologist, Dr. Percival Spanish.  Collect A1c and screening PSA with his fasting labs.  He will return at a later date for these.   No orders of the defined types were placed in this encounter.  No orders of the defined types were placed in this encounter.    Janora Norlander, DO [place] [phone]

## 2020-06-11 NOTE — Patient Instructions (Signed)
Hidradenitis Suppurativa Hidradenitis suppurativa is a long-term (chronic) skin disease. It is similar to a severe form of acne, but it affects areas of the body where acne would be unusual, especially areas of the body where skin rubs against skin and becomes moist. These include:  Underarms.  Groin.  Genital area.  Buttocks.  Upper thighs.  Breasts. Hidradenitis suppurativa may start out as small lumps or pimples caused by blocked sweat glands or hair follicles. Pimples may develop into deep sores that break open (rupture) and drain pus. Over time, affected areas of skin may thicken and become scarred. This condition is rare and does not spread from person to person (non-contagious). What are the causes? The exact cause of this condition is not known. It may be related to:  Male and male hormones.  An overactive disease-fighting system (immune system). The immune system may over-react to blocked hair follicles or sweat glands and cause swelling and pus-filled sores. What increases the risk? You are more likely to develop this condition if you:  Are male.  Are 11-55 years old.  Have a family history of hidradenitis suppurativa.  Have a personal history of acne.  Are overweight.  Smoke.  Take the medicine lithium. What are the signs or symptoms? The first symptoms are usually painful bumps in the skin, similar to pimples. The condition may get worse over time (progress), or it may only cause mild symptoms. If the disease progresses, symptoms may include:  Skin bumps getting bigger and growing deeper into the skin.  Bumps rupturing and draining pus.  Itchy, infected skin.  Skin getting thicker and scarred.  Tunnels under the skin (fistulas) where pus drains from a bump.  Pain during daily activities, such as pain during walking if your groin area is affected.  Emotional problems, such as stress or depression. This condition may affect your appearance and your  ability or willingness to wear certain clothes or do certain activities. How is this diagnosed? This condition is diagnosed by a health care provider who specializes in skin diseases (dermatologist). You may be diagnosed based on:  Your symptoms and medical history.  A physical exam.  Testing a pus sample for infection.  Blood tests. How is this treated? Your treatment will depend on how severe your symptoms are. The same treatment will not work for everybody with this condition. You may need to try several treatments to find what works best for you. Treatment may include:  Cleaning and bandaging (dressing) your wounds as needed.  Lifestyle changes, such as new skin care routines.  Taking medicines, such as: ? Antibiotics. ? Acne medicines. ? Medicines to reduce the activity of the immune system. ? A diabetes medicine (metformin). ? Birth control pills, for women. ? Steroids to reduce swelling and pain.  Working with a mental health care provider, if you experience emotional distress due to this condition. If you have severe symptoms that do not get better with medicine, you may need surgery. Surgery may involve:  Using a laser to clear the skin and remove hair follicles.  Opening and draining deep sores.  Removing the areas of skin that are diseased and scarred. Follow these instructions at home: Medicines  Take over-the-counter and prescription medicines only as told by your health care provider.  If you were prescribed an antibiotic medicine, take it as told by your health care provider. Do not stop taking the antibiotic even if your condition improves.   Skin care  If you have open wounds,   cover them with a clean dressing as told by your health care provider. Keep wounds clean by washing them gently with soap and water when you bathe.  Do not shave the areas where you get hidradenitis suppurativa.  Do not wear deodorant.  Wear loose-fitting clothes.  Try to avoid  getting overheated or sweaty. If you get sweaty or wet, change into clean, dry clothes as soon as you can.  To help relieve pain and itchiness, cover sore areas with a warm, clean washcloth (warm compress) for 5-10 minutes as often as needed.  If told by your health care provider, take a bleach bath twice a week: ? Fill your bathtub halfway with water. ? Pour in  cup of unscented household bleach. ? Soak in the tub for 5-10 minutes. ? Only soak from the neck down. Avoid water on your face and hair. ? Shower to rinse off the bleach from your skin. General instructions  Learn as much as you can about your disease so that you have an active role in your treatment. Work closely with your health care provider to find treatments that work for you.  If you are overweight, work with your health care provider to lose weight as recommended.  Do not use any products that contain nicotine or tobacco, such as cigarettes and e-cigarettes. If you need help quitting, ask your health care provider.  If you struggle with living with this condition, talk with your health care provider or work with a mental health care provider as recommended.  Keep all follow-up visits as told by your health care provider. This is important. Where to find more information  Hidradenitis Suppurativa Foundation, Inc.: https://www.hs-foundation.org/  American Academy of Dermatology: https://www.aad.org Contact a health care provider if you have:  A flare-up of hidradenitis suppurativa.  A fever or chills.  Trouble controlling your symptoms at home.  Trouble doing your daily activities because of your symptoms.  Trouble dealing with emotional problems related to your condition. Summary  Hidradenitis suppurativa is a long-term (chronic) skin disease. It is similar to a severe form of acne, but it affects areas of the body where acne would be unusual.  The first symptoms are usually painful bumps in the skin, similar  to pimples. The condition may only cause mild symptoms, or it may get worse over time (progress).  If you have open wounds, cover them with a clean dressing as told by your health care provider. Keep wounds clean by washing them gently with soap and water when you bathe.  Besides skin care, treatment may include medicines, laser treatment, and surgery. This information is not intended to replace advice given to you by your health care provider. Make sure you discuss any questions you have with your health care provider. Document Revised: 11/25/2019 Document Reviewed: 11/25/2019 Elsevier Patient Education  2021 [employer].  

## 2020-07-27 ENCOUNTER — Ambulatory Visit: Payer: Commercial Managed Care - PPO | Admitting: Family Medicine

## 2020-07-27 DIAGNOSIS — M545 Low back pain, unspecified: Secondary | ICD-10-CM | POA: Diagnosis not present

## 2020-07-27 MED ORDER — METHYLPREDNISOLONE ACETATE 80 MG/ML IJ SUSP: 80.0000 mg | Freq: Once | INTRAMUSCULAR | Status: DC

## 2020-07-27 MED ORDER — PREDNISONE 10 MG (21) PO TBPK
ORAL_TABLET | ORAL | 0 refills | Status: DC
Start: 2020-07-27 — End: 2020-09-24

## 2020-07-27 MED ORDER — METHYLPREDNISOLONE ACETATE 40 MG/ML IJ SUSP
80.0000 mg | Freq: Once | INTRAMUSCULAR | Status: AC
  Administered 2020-07-27: 80 mg via INTRAMUSCULAR

## 2020-07-27 MED ORDER — BACLOFEN 10 MG PO TABS: 10.0000 mg | ORAL_TABLET | Freq: Three times a day (TID) | ORAL | 0 refills | Status: DC

## 2020-07-27 NOTE — Progress Notes (Signed)
Telephone visit  Subjective: CC:Low back pain PCP: Janora Norlander, DO SNK:[id number] Scott Hatfield is a 60 y.o. male calls for telephone consult today. Patient provides verbal consent for consult held via phone.  Due to COVID-19 pandemic this visit was conducted virtually. This visit type was conducted due to national recommendations for restrictions regarding the COVID-19 Pandemic (e.g. social distancing, sheltering in place) in an effort to limit this patient's exposure and mitigate transmission in our community. All issues noted in this document were discussed and addressed.  A physical exam was not performed with this format.   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 Low back pain Patient reports that he was doing some work around the house and pulled a cemented post out of the ground and hurt his back about 1 week.  It got worse Sunday night.  He was having back spasms/ sharp back pains.  He denies numbness, tingling, weakness. No saddle anesthesia.  Has used motrin/ biofreeze which are not helping.   ROS: Per HPI  No Known Allergies Past Medical History:  Diagnosis Date   Allergy    occasionally    CAD in native artery    a. 2008 - Single-vessel CAD with stent placement to the circumflex  b. 2012 - DES to Diag.  Scott. 06/2014 - RCA 25% stenosis, LAD 30% stenosis, ramus intermediate 45% stenosis, D1 95% stenosis treated with DES  d.01/2015 - nonobstructive CAD   GERD (gastroesophageal reflux disease)    Hyperlipidemia, mixed    Hypertension    Myocardial infarction ([place]) 2008   Overweight(278.02)    Sleep apnea    CPAP    Current Outpatient Medications:    albuterol (VENTOLIN HFA) 108 (90 Base) MCG/ACT inhaler, Inhale 2 puffs into the lungs every 6 (six) hours as needed for wheezing or shortness of breath., Disp: 8 g, Rfl: 0   amLODipine (NORVASC) 10 MG tablet, Take 1 tablet (10 mg total) by mouth daily., Disp: 90 tablet, Rfl:  3   aspirin 81 MG chewable tablet, Chew 81 mg by mouth daily., Disp: , Rfl:    atorvastatin (LIPITOR) 40 MG tablet, TAKE 1 TABLET BY MOUTH IN THE EVENING AT  6  PM, Disp: 90 tablet, Rfl: 3   buPROPion (WELLBUTRIN XL) 150 MG 24 hr tablet, TAKE 1 TABLET BY MOUTH ONCE DAILY IN THE MORNING ., Disp: 90 tablet, Rfl: 3   clindamycin (CLEOCIN T) 1 % SWAB, Apply to affected area twice daily, Disp: 60 each, Rfl: 12   hydrochlorothiazide (HYDRODIURIL) 25 MG tablet, Take 1 tablet (25 mg total) by mouth daily., Disp: 90 tablet, Rfl: 3   lisinopril (ZESTRIL) 40 MG tablet, Take 1 tablet (40 mg total) by mouth daily., Disp: 90 tablet, Rfl: 3   metoprolol tartrate (LOPRESSOR) 50 MG tablet, Take 1 tablet (50 mg total) by mouth 2 (two) times daily., Disp: 180 tablet, Rfl: 3   nitroGLYCERIN (NITROSTAT) 0.4 MG SL tablet, Place 1 tablet (0.4 mg total) under the tongue every 5 (five) minutes x 3 doses as needed for chest pain., Disp: 25 tablet, Rfl: 1   Assessment/ Plan: 60 y.o. male   Acute bilateral low back pain without sciatica - Plan: methylPREDNISolone acetate (DEPO-MEDROL) injection 80 mg, predniSONE (STERAPRED UNI-PAK 21 TAB) 10 MG (21) TBPK tablet, baclofen (LIORESAL) 10 MG tablet No red flag symptoms.  Likely a muscle spasm.  He will come in for corticosteroid injection.  Prednisone Dosepak to be  started tomorrow.  Baclofen prescribed.  Interjections reviewed and reasons for reevaluation discussed.  He voiced understanding and will follow up as needed  Start time: 9:21am End time: 9:26am  Total time spent on patient care (including telephone call/ virtual visit): 5 minutes  [place], [place] [phone]

## 2020-07-27 NOTE — Addendum Note (Signed)
Addended by: Zannie Cove on: 07/27/2020 04:07 PM   Modules accepted: Orders

## 2020-09-05 ENCOUNTER — Other Ambulatory Visit: Payer: Self-pay | Admitting: Family Medicine

## 2020-09-05 DIAGNOSIS — I10 Essential (primary) hypertension: Secondary | ICD-10-CM

## 2020-09-17 ENCOUNTER — Ambulatory Visit: Payer: Self-pay | Admitting: Family Medicine

## 2020-09-24 ENCOUNTER — Ambulatory Visit: Payer: Commercial Managed Care - PPO | Admitting: Family Medicine

## 2020-09-24 DIAGNOSIS — K1120 Sialoadenitis, unspecified: Secondary | ICD-10-CM | POA: Diagnosis not present

## 2020-09-24 MED ORDER — CEPHALEXIN 500 MG PO CAPS: 500.0000 mg | ORAL_CAPSULE | Freq: Four times a day (QID) | ORAL | 0 refills | Status: AC

## 2020-09-24 NOTE — Progress Notes (Signed)
Telephone visit  Subjective: CC: swollen jaw PCP: Janora Norlander, DO [id number] Scott Hatfield is a 60 y.o. male calls for telephone consult today. Patient provides verbal consent for consult held via phone.  Due to COVID-19 pandemic this visit was conducted virtually. This visit type was conducted due to national recommendations for restrictions regarding the COVID-19 Pandemic (e.g. social distancing, sheltering in place) in an effort to limit this patient's exposure and mitigate transmission in our community. All issues noted in this document were discussed and addressed.  A physical exam was not performed with this format.   Location of patient: work Location of provider: [facility] Others present for call: none  1. Salivary gland swelling Onset of salivary gland swelling last evening and continues to swell with each meal.  She has been eating sour stuff in efforts to get it release.  No fevers, redness on the outside of face.  Notes soreness to touch.    ROS: Per HPI  No Known Allergies Past Medical History:  Diagnosis Date   Allergy    occasionally    CAD in native artery    a. 2008 - Single-vessel CAD with stent placement to the circumflex  b. 2012 - DES to Diag.  Scott. 06/2014 - RCA 25% stenosis, LAD 30% stenosis, ramus intermediate 45% stenosis, D1 95% stenosis treated with DES  d.01/2015 - nonobstructive CAD   GERD (gastroesophageal reflux disease)    Hyperlipidemia, mixed    Hypertension    Myocardial infarction ([place]) 2008   Overweight(278.02)    Sleep apnea    CPAP    Current Outpatient Medications:    albuterol (VENTOLIN HFA) 108 (90 Base) MCG/ACT inhaler, Inhale 2 puffs into the lungs every 6 (six) hours as needed for wheezing or shortness of breath., Disp: 8 g, Rfl: 0   amLODipine (NORVASC) 10 MG tablet, Take 1 tablet (10 mg total) by mouth daily., Disp: 90 tablet, Rfl: 3   aspirin 81 MG chewable tablet, Chew 81 mg by mouth daily., Disp: , Rfl:    atorvastatin (LIPITOR) 40  MG tablet, TAKE 1 TABLET BY MOUTH IN THE EVENING AT  6  PM, Disp: 90 tablet, Rfl: 3   baclofen (LIORESAL) 10 MG tablet, Take 1 tablet (10 mg total) by mouth 3 (three) times daily., Disp: 30 each, Rfl: 0   buPROPion (WELLBUTRIN XL) 150 MG 24 hr tablet, TAKE 1 TABLET BY MOUTH ONCE DAILY IN THE MORNING ., Disp: 90 tablet, Rfl: 3   clindamycin (CLEOCIN T) 1 % SWAB, Apply to affected area twice daily, Disp: 60 each, Rfl: 12   hydrochlorothiazide (HYDRODIURIL) 25 MG tablet, Take 1 tablet (25 mg total) by mouth daily., Disp: 90 tablet, Rfl: 3   lisinopril (ZESTRIL) 40 MG tablet, Take 1 tablet (40 mg total) by mouth daily., Disp: 90 tablet, Rfl: 3   metoprolol tartrate (LOPRESSOR) 50 MG tablet, Take 1 tablet (50 mg total) by mouth 2 (two) times daily., Disp: 180 tablet, Rfl: 3   nitroGLYCERIN (NITROSTAT) 0.4 MG SL tablet, Place 1 tablet (0.4 mg total) under the tongue every 5 (five) minutes x 3 doses as needed for chest pain., Disp: 25 tablet, Rfl: 1   predniSONE (STERAPRED UNI-PAK 21 TAB) 10 MG (21) TBPK tablet, As directed x 6 days, Disp: 21 tablet, Rfl: 0  Assessment/ Plan: 60 y.o. male   Salivary gland infection - Plan: cephALEXin (KEFLEX) 500 MG capsule  Proceed with Keflex '500mg'$  QID.  Home care instructions reviewed with patient.  Continue adequate  hydration.  Use of warm compresses recommended.  Okay to continue oral NSAID as needed.  Start time: 12:15pm End time: 12:20p,  Total time spent on patient care (including telephone call/ virtual visit): 5 minutes  [place], [place] [phone]

## 2020-11-25 ENCOUNTER — Encounter: Payer: Self-pay | Admitting: Family

## 2020-11-25 ENCOUNTER — Ambulatory Visit: Payer: Commercial Managed Care - PPO | Admitting: Family

## 2020-11-25 DIAGNOSIS — U071 COVID-19: Secondary | ICD-10-CM

## 2020-11-25 MED ORDER — BENZONATATE 200 MG PO CAPS: 200.0000 mg | ORAL_CAPSULE | Freq: Three times a day (TID) | ORAL | 1 refills | Status: DC | PRN

## 2020-11-25 MED ORDER — FLUTICASONE PROPIONATE 50 MCG/ACT NA SUSP: 2.0000 | Freq: Every day | NASAL | 6 refills | Status: DC

## 2020-11-25 MED ORDER — MOLNUPIRAVIR EUA 200MG CAPSULE: 4.0000 | ORAL_CAPSULE | Freq: Two times a day (BID) | ORAL | 0 refills | Status: AC

## 2020-11-25 NOTE — Progress Notes (Signed)
Virtual Visit Consent   Scott Hatfield, you are scheduled for a virtual visit with a [facility] provider today.     Just as with appointments in the office, your consent must be obtained to participate.  Your consent will be active for this visit and any virtual visit you may have with one of our providers in the next 365 days.     If you have a MyChart account, a copy of this consent can be sent to you electronically.  All virtual visits are billed to your insurance company just like a traditional visit in the office.    As this is a virtual visit, video technology does not allow for your provider to perform a traditional examination.  This may limit your provider's ability to fully assess your condition.  If your provider identifies any concerns that need to be evaluated in person or the need to arrange testing (such as labs, EKG, etc.), we will make arrangements to do so.     Although advances in technology are sophisticated, we cannot ensure that it will always work on either your end or our end.  If the connection with a video visit is poor, the visit may have to be switched to a telephone visit.  With either a video or telephone visit, we are not always able to ensure that we have a secure connection.     I need to obtain your verbal consent now.   Are you willing to proceed with your visit today?    Scott Hatfield has provided verbal consent on 11/25/2020 for a virtual visit (video or telephone).   Evelina Dun, FNP   Date: 11/25/2020 9:12 AM   Virtual Visit via Video Note   I, Evelina Dun, connected with  Scott Hatfield  ([id number], 14-Jul-1960) on 11/25/20 at  8:25 AM EDT by a video-enabled telemedicine application and verified that I am speaking with the correct person using two identifiers.  Location: Patient: Virtual Visit Location Patient: Work Provider: Scientist, research (medical) Provider: Office/Clinic   I discussed the limitations of evaluation and management by  telemedicine and the availability of in person appointments. The patient expressed understanding and agreed to proceed.    Mr. fares, ramthun are scheduled for a virtual visit with your provider today.    Just as we do with appointments in the office, we must obtain your consent to participate.  Your consent will be active for this visit and any virtual visit you may have with one of our providers in the next 365 days.    If you have a MyChart account, I can also send a copy of this consent to you electronically.  All virtual visits are billed to your insurance company just like a traditional visit in the office.  As this is a virtual visit, video technology does not allow for your provider to perform a traditional examination.  This may limit your provider's ability to fully assess your condition.  If your provider identifies any concerns that need to be evaluated in person or the need to arrange testing such as labs, EKG, etc, we will make arrangements to do so.    Although advances in technology are sophisticated, we cannot ensure that it will always work on either your end or our end.  If the connection with a video visit is poor, we may have to switch to a telephone visit.  With either a video or telephone visit, we are not always able to ensure that  we have a secure connection.   I need to obtain your verbal consent now.   Are you willing to proceed with your visit today?   Scott Hatfield has provided verbal consent on 11/25/2020 for a virtual visit (video or telephone).   Evelina Dun, [place] 11/25/2020  9:12 AM    History of Present Illness: Scott Hatfield is a 60 y.o. who identifies as a male who was assigned male at birth, and is being seen today for COVID. His symptoms started yesterday evening and tested positive this AM. His wife was diagnosed with COVID two days ago.   HPI: Cough This is a new problem. The current episode started yesterday. The problem has been gradually worsening.  The problem occurs every few minutes. The cough is Non-productive. Associated symptoms include chills, myalgias, nasal congestion, postnasal drip, shortness of breath and wheezing. Pertinent negatives include no ear congestion, ear pain, fever, headaches or sore throat. Risk factors for lung disease include smoking/tobacco exposure. He has tried rest for the symptoms. The treatment provided no relief.   Problems:  Patient Active Problem List   Diagnosis Date Noted   Perineal abscess, superficial 09/02/2019   Tobacco abuse 05/20/2019   Educated about COVID-19 virus infection 05/20/2019   Benign lipomatous neoplasm of skin, subcu of right arm 09/05/2018   Sebaceous cyst 09/05/2018   Constipation 09/05/2018   Neuropathy 04/11/2017   Morbid obesity ([place]) 12/20/2016   Fatigue 12/20/2016   Coronary artery disease involving native coronary artery of native heart without angina pectoris 04/04/2016   Encounter for immunization 04/04/2016   Sleep apnea-on C-pap 07/04/2014   Acute coronary syndrome ([place]) 07/04/2014   Chest pain 07/03/2014   Unstable angina ([place])    PRECORDIAL PAIN 03/02/2010   Encounter for smoking cessation counseling 11/12/2008   Dyslipidemia 10/07/2008   OVERWEIGHT/OBESITY 10/07/2008   Essential hypertension 10/07/2008   CAD- S/P PCI '08, 2012, Dx DES 07/03/14 10/07/2008    Allergies: No Known Allergies Medications:  Current Outpatient Medications:    benzonatate (TESSALON) 200 MG capsule, Take 1 capsule (200 mg total) by mouth 3 (three) times daily as needed., Disp: 30 capsule, Rfl: 1   fluticasone (FLONASE) 50 MCG/ACT nasal spray, Place 2 sprays into both nostrils daily., Disp: 16 g, Rfl: 6   molnupiravir EUA (LAGEVRIO) 200 mg CAPS capsule, Take 4 capsules (800 mg total) by mouth 2 (two) times daily for 5 days., Disp: 40 capsule, Rfl: 0   albuterol (VENTOLIN HFA) 108 (90 Base) MCG/ACT inhaler, Inhale 2 puffs into the lungs every 6 (six) hours as needed for wheezing or  shortness of breath., Disp: 8 g, Rfl: 0   amLODipine (NORVASC) 10 MG tablet, Take 1 tablet (10 mg total) by mouth daily., Disp: 90 tablet, Rfl: 3   aspirin 81 MG chewable tablet, Chew 81 mg by mouth daily., Disp: , Rfl:    atorvastatin (LIPITOR) 40 MG tablet, TAKE 1 TABLET BY MOUTH IN THE EVENING AT  6  PM, Disp: 90 tablet, Rfl: 3   baclofen (LIORESAL) 10 MG tablet, Take 1 tablet (10 mg total) by mouth 3 (three) times daily., Disp: 30 each, Rfl: 0   buPROPion (WELLBUTRIN XL) 150 MG 24 hr tablet, TAKE 1 TABLET BY MOUTH ONCE DAILY IN THE MORNING ., Disp: 90 tablet, Rfl: 3   hydrochlorothiazide (HYDRODIURIL) 25 MG tablet, Take 1 tablet (25 mg total) by mouth daily., Disp: 90 tablet, Rfl: 3   lisinopril (ZESTRIL) 40 MG tablet, Take 1 tablet (40 mg  total) by mouth daily., Disp: 90 tablet, Rfl: 3   metoprolol tartrate (LOPRESSOR) 50 MG tablet, Take 1 tablet (50 mg total) by mouth 2 (two) times daily., Disp: 180 tablet, Rfl: 3   nitroGLYCERIN (NITROSTAT) 0.4 MG SL tablet, Place 1 tablet (0.4 mg total) under the tongue every 5 (five) minutes x 3 doses as needed for chest pain., Disp: 25 tablet, Rfl: 1  Observations/Objective: No SOB or distress noted, hoarse voice  Assessment and Plan: 1. COVID-19 - molnupiravir EUA (LAGEVRIO) 200 mg CAPS capsule; Take 4 capsules (800 mg total) by mouth 2 (two) times daily for 5 days.  Dispense: 40 capsule; Refill: 0 - fluticasone (FLONASE) 50 MCG/ACT nasal spray; Place 2 sprays into both nostrils daily.  Dispense: 16 g; Refill: 6 - benzonatate (TESSALON) 200 MG capsule; Take 1 capsule (200 mg total) by mouth 3 (three) times daily as needed.  Dispense: 30 capsule; Refill: 1 COVID positive, rest, force fluids, tylenol as needed, Quarantine for at least 5 days and you are fever free, then must wear a mask out in public from day 4-65, report any worsening symptoms such as increased shortness of breath, swelling, or continued high fevers. Possible adverse effects discussed  with antivirals.  Pt currently at work, discussed he needs to quarantine.   Follow Up Instructions: I discussed the assessment and treatment plan with the patient. The patient was provided an opportunity to ask questions and all were answered. The patient agreed with the plan and demonstrated an understanding of the instructions.  A copy of instructions were sent to the patient via MyChart unless otherwise noted below.     The patient was advised to call back or seek an in-person evaluation if the symptoms worsen or if the condition fails to improve as anticipated.  Time:  I spent 11 minutes with the patient via telehealth technology discussing the above problems/concerns.    Evelina Dun, FNP

## 2020-11-25 NOTE — Patient Instructions (Signed)
10 Things You Can Do to Manage Your COVID-19 Symptoms at Home If you have possible or confirmed COVID-19 Stay home except to get medical care. Monitor your symptoms carefully. If your symptoms get worse, call your healthcare provider immediately. Get rest and stay hydrated. If you have a medical appointment, call the healthcare provider ahead of time and tell them that you have or may have COVID-19. For medical emergencies, call 911 and notify the dispatch personnel that you have or may have COVID-19. Cover your cough and sneezes with a tissue or use the inside of your elbow. Wash your hands often with soap and water for at least 20 seconds or clean your hands with an alcohol-based hand sanitizer that contains at least 60% alcohol. As much as possible, stay in a specific room and away from other people in your home. Also, you should use a separate bathroom, if available. If you need to be around other people in or outside of the home, wear a mask. Avoid sharing personal items with other people in your household, like dishes, towels, and bedding. Clean all surfaces that are touched often, like counters, tabletops, and doorknobs. Use household cleaning sprays or wipes according to the label instructions. cdc.gov/coronavirus 08/29/2019 This information is not intended to replace advice given to you by your health care provider. Make sure you discuss any questions you have with your health care provider. Document Revised: 06/17/2020 Document Reviewed: 06/17/2020 Elsevier Patient Education  2022 Elsevier Inc.  

## 2020-12-15 ENCOUNTER — Ambulatory Visit: Payer: Commercial Managed Care - PPO | Admitting: Family Medicine

## 2020-12-15 DIAGNOSIS — K1120 Sialoadenitis, unspecified: Secondary | ICD-10-CM

## 2020-12-15 MED ORDER — PREDNISONE 20 MG PO TABS: ORAL_TABLET | ORAL | 0 refills | Status: DC

## 2020-12-15 MED ORDER — CEPHALEXIN 500 MG PO CAPS
500.0000 mg | ORAL_CAPSULE | Freq: Four times a day (QID) | ORAL | 0 refills | Status: AC
Start: 2020-12-15 — End: 2020-12-25

## 2020-12-15 NOTE — Progress Notes (Signed)
Telephone visit  Subjective: CC: salivary gland PCP: Janora Norlander, DO OHY:[id number] C Lubrano is a 60 y.o. male calls for telephone consult today. Patient provides verbal consent for consult held via phone.  Due to COVID-19 pandemic this visit was conducted virtually. This visit type was conducted due to national recommendations for restrictions regarding the COVID-19 Pandemic (e.g. social distancing, sheltering in place) in an effort to limit this patient's exposure and mitigate transmission in our community. All issues noted in this document were discussed and addressed.  A physical exam was not performed with this format.   Location of patient: home Location of provider: [facility] Others present for call: none  1. Salivary gland Onset a couple weeks ago and has been gradually getting worse.  He reports pain/ swelling on right side.  Area is sore.  No fevers reported.  Pain does seem to get worse when he tries to eat.  He has been applying cold compresses to the affected area and drinking plenty of water.  He admits he is still smoking.  Similar symptoms occurred on the other side a couple of months ago.  ROS: Per HPI  No Known Allergies Past Medical History:  Diagnosis Date   Allergy    occasionally    CAD in native artery    a. 2008 - Single-vessel CAD with stent placement to the circumflex  b. 2012 - DES to Diag.  c. 06/2014 - RCA 25% stenosis, LAD 30% stenosis, ramus intermediate 45% stenosis, D1 95% stenosis treated with DES  d.01/2015 - nonobstructive CAD   GERD (gastroesophageal reflux disease)    Hyperlipidemia, mixed    Hypertension    Myocardial infarction ([place]) 2008   Overweight(278.02)    Sleep apnea    CPAP    Current Outpatient Medications:    albuterol (VENTOLIN HFA) 108 (90 Base) MCG/ACT inhaler, Inhale 2 puffs into the lungs every 6 (six) hours as needed for wheezing or shortness of breath., Disp: 8 g, Rfl: 0   amLODipine (NORVASC) 10 MG tablet, Take 1 tablet (10  mg total) by mouth daily., Disp: 90 tablet, Rfl: 3   aspirin 81 MG chewable tablet, Chew 81 mg by mouth daily., Disp: , Rfl:    atorvastatin (LIPITOR) 40 MG tablet, TAKE 1 TABLET BY MOUTH IN THE EVENING AT  6  PM, Disp: 90 tablet, Rfl: 3   baclofen (LIORESAL) 10 MG tablet, Take 1 tablet (10 mg total) by mouth 3 (three) times daily., Disp: 30 each, Rfl: 0   benzonatate (TESSALON) 200 MG capsule, Take 1 capsule (200 mg total) by mouth 3 (three) times daily as needed., Disp: 30 capsule, Rfl: 1   buPROPion (WELLBUTRIN XL) 150 MG 24 hr tablet, TAKE 1 TABLET BY MOUTH ONCE DAILY IN THE MORNING ., Disp: 90 tablet, Rfl: 3   fluticasone (FLONASE) 50 MCG/ACT nasal spray, Place 2 sprays into both nostrils daily., Disp: 16 g, Rfl: 6   hydrochlorothiazide (HYDRODIURIL) 25 MG tablet, Take 1 tablet (25 mg total) by mouth daily., Disp: 90 tablet, Rfl: 3   lisinopril (ZESTRIL) 40 MG tablet, Take 1 tablet (40 mg total) by mouth daily., Disp: 90 tablet, Rfl: 3   metoprolol tartrate (LOPRESSOR) 50 MG tablet, Take 1 tablet (50 mg total) by mouth 2 (two) times daily., Disp: 180 tablet, Rfl: 3   nitroGLYCERIN (NITROSTAT) 0.4 MG SL tablet, Place 1 tablet (0.4 mg total) under the tongue every 5 (five) minutes x 3 doses as needed for chest pain., Disp: 25 tablet,  Rfl: 1  Assessment/ Plan: 60 y.o. male   Salivary gland infection - Plan: cephALEXin (KEFLEX) 500 MG capsule, Ambulatory referral to ENT, predniSONE (DELTASONE) 20 MG tablet  Concerned that he continues to get these.  I am going to empirically treat him for infection given degree of swelling and pain but they simply may be obstructions for what ever reason.  We discussed that smoking increases his risk for this.  I have advised him to use warm compresses in efforts to hopefully get the gland to drain.  He understands red flag signs and symptoms warranting further evaluation.  If symptoms abruptly get worse or he develops any other worrisome symptoms or signs, low  threshold for advanced imaging  Start time: 5:25pm End time: 5:31pm  Total time spent on patient care (including telephone call/ virtual visit): 6 minutes  [place], [place] [phone]

## 2021-04-08 IMAGING — MR MR KNEE*R* WO/W CM
10 series · 40 of 40 positions shown · IV contrast (agent unspecified)
Comparison: Ultrasound 03/03/2020

CLINICAL DATA: Soft tissue mass at the medial aspect of the right
knee for 5 weeks

EXAM:
MRI OF THE RIGHT KNEE WITHOUT AND WITH CONTRAST
TECHNIQUE: Multiplanar, multisequence MR imaging of the knee was performed. No
intravenous contrast was administered.

[Series 9: T2 fat-sat · axial · right · 4.0mm · 0.56mm/px · z∈[-130,+24]mm · 3 of 32 slices shown (1 of 3)]
[im 1/32]
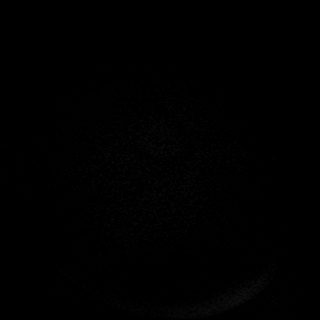
[im 16/32]
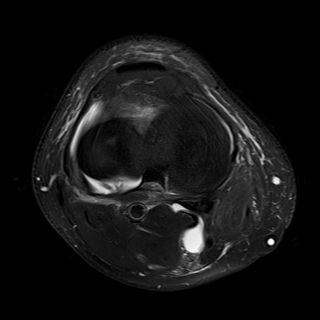
[im 32/32]
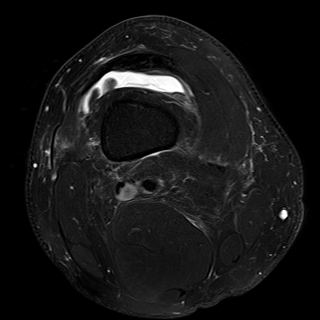

[Series 10: T1 · coronal · right · 4.0mm · 0.66mm/px · 3 of 32 slices shown]
[im 1/32]
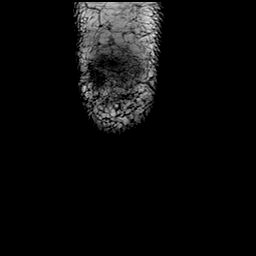
[im 16/32]
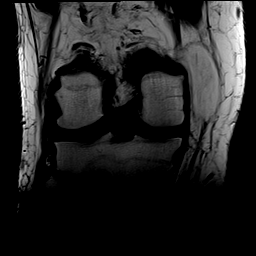
[im 32/32]
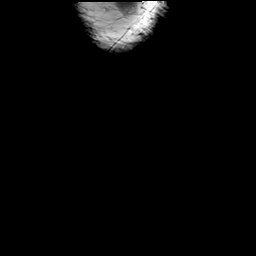

[Series 11: T2 fat-sat · coronal · right · 4.0mm · 0.66mm/px · 4 of 32 slices shown (2 of 3)]
[im 1/32]
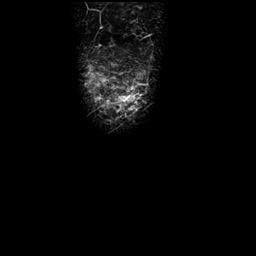
[im 11/32]
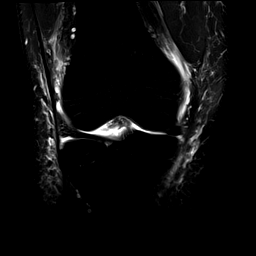
[im 21/32]
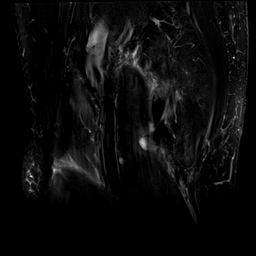
[im 32/32]
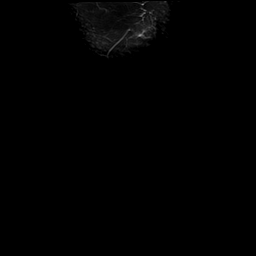

[Series 12: PD fat-sat · sagittal · right · 3.0mm · 0.70mm/px · 5 of 38 slices shown (1 of 2)]
[im 1/38]
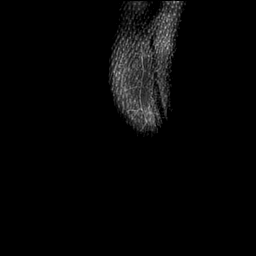
[im 10/38]
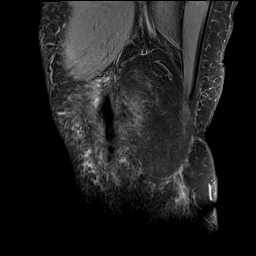
[im 19/38]
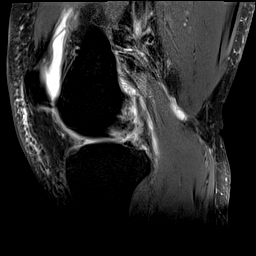
[im 28/38]
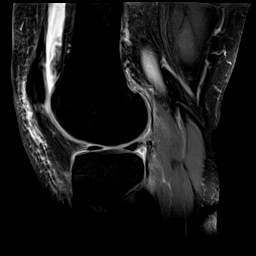
[im 38/38]
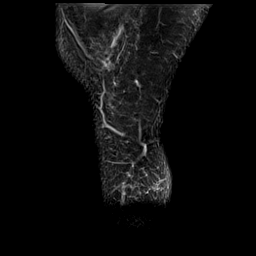

[Series 13: T2 fat-sat · sagittal · right · 3.0mm · 0.70mm/px · 5 of 38 slices shown (3 of 3)]
[im 1/38]
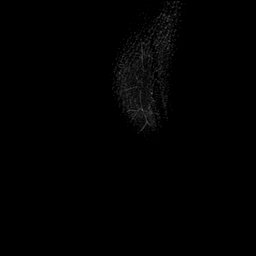
[im 10/38]
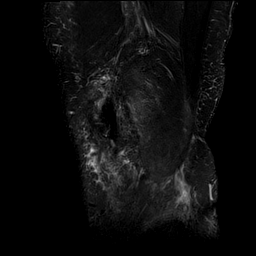
[im 19/38]
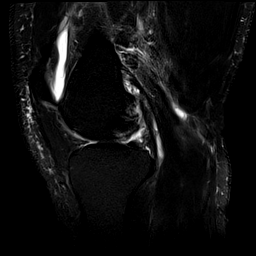
[im 28/38]
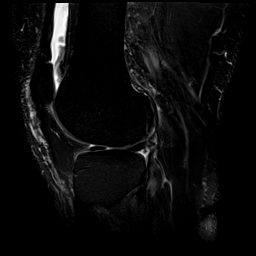
[im 38/38]
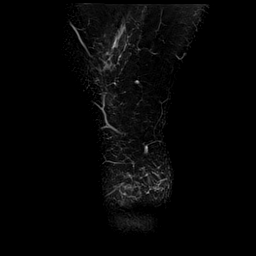

[Series 14: PD fat-sat · coronal · right · 3.0mm · 0.66mm/px · 5 of 42 slices shown (2 of 2)]
[im 1/42]
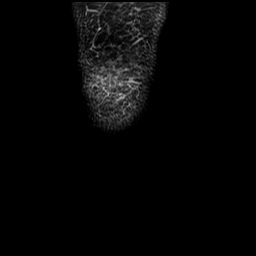
[im 11/42]
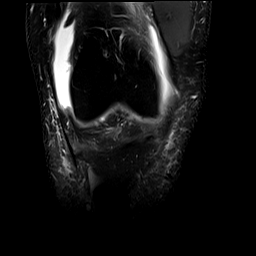
[im 21/42]
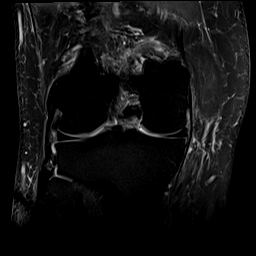
[im 31/42]
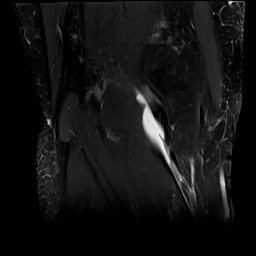
[im 42/42]
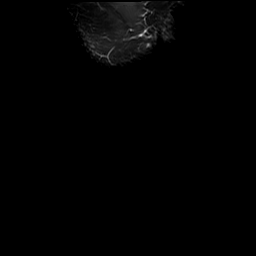

[Series 15: PD · coronal · right · 2.0mm · 0.52mm/px · 3 of 22 slices shown]
[im 1/22]
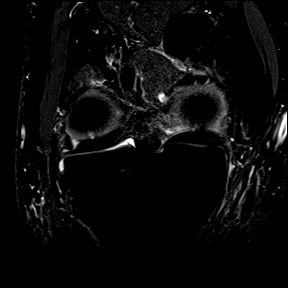
[im 11/22]
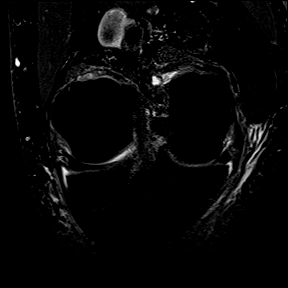
[im 22/22]
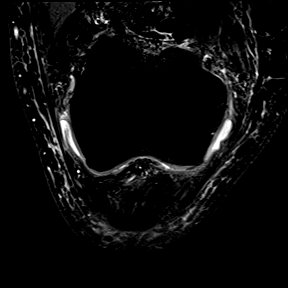

[Series 16: T1 fat-sat · axial · non-contrast · right · 4.0mm · 0.62mm/px · z∈[-127,+37]mm · 4 of 34 slices shown]
[im 1/34]
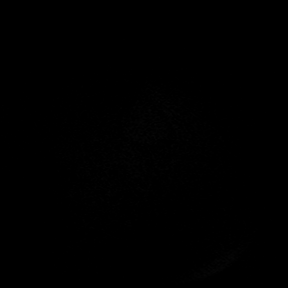
[im 12/34]
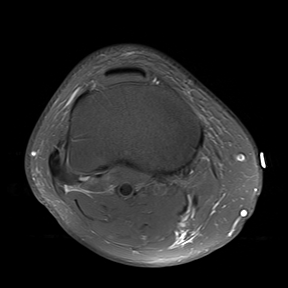
[im 23/34]
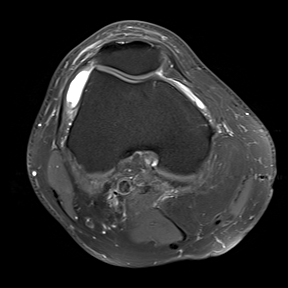
[im 34/34]
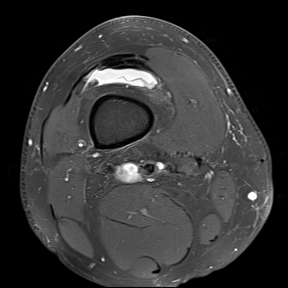

[Series 17: T1 fat-sat post-contrast · axial · right · 4.0mm · 0.62mm/px · z∈[-127,+37]mm · 4 of 34 slices shown (1 of 2)]
[im 1/34]
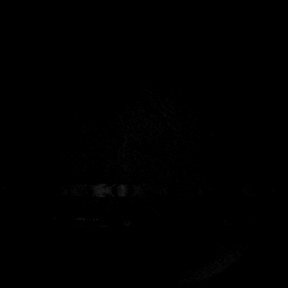
[im 12/34]
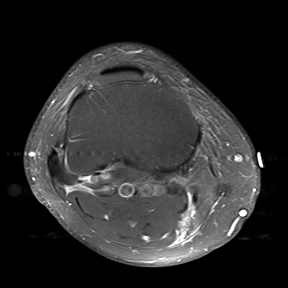
[im 23/34]
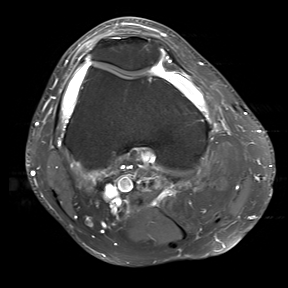
[im 34/34]
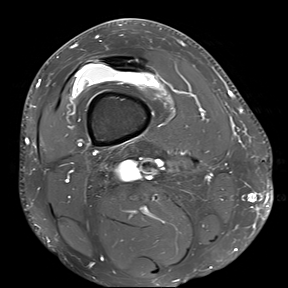

[Series 18: T1 fat-sat post-contrast · coronal · right · 4.0mm · 0.66mm/px · 4 of 32 slices shown (2 of 2)]
[im 1/32]
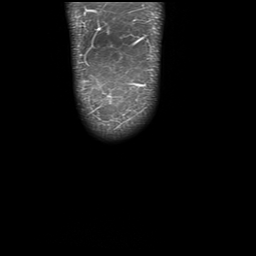
[im 11/32]
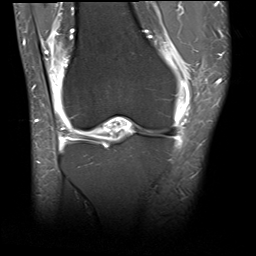
[im 21/32]
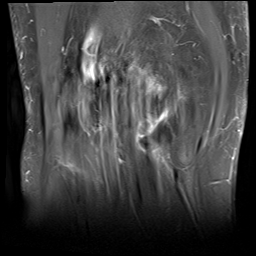
[im 32/32]
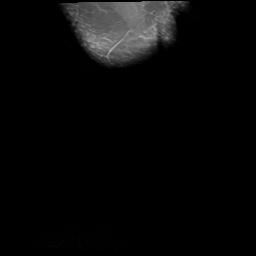

[40 of 40 positions shown; findings below may reference images not displayed]

FINDINGS: MENISCI

Medial meniscus: Oblique tear extending to the inferior articular
surface of the medial meniscal body and posterior horn (series 12,
images 23-27).

Lateral meniscus:  Intact.

LIGAMENTS

Cruciates:  Intact ACL and PCL.

Collaterals: Medial collateral ligament is intact. Lateral
collateral ligament complex is intact.

CARTILAGE

Patellofemoral: Partial-thickness chondral thinning and surface
irregularity of the trochlear groove and medial patellar facet.

Medial: Mild chondral thinning and surface irregularity of the
weight-bearing medial compartment.

Lateral: Near full-thickness chondral fissure along the central to
posterior portions of the weight-bearing lateral femoral condyle
(series 11, images 15-17).

Joint: Small-moderate knee joint effusion. Mild edema within Hoffa's
fat.

Popliteal Fossa: Small leaking Baker's cyst containing loose body.
Mild distal popliteus tendinosis.

Extensor Mechanism:  Intact quadriceps tendon and patellar tendon.

Bones: No acute fracture. No malalignment. No suspicious bone
lesion.

Other: Circumscribed lobulated T1 hyperintense soft tissue mass at
the medial aspect of the knee measuring approximately 9.0 x 5.5 x
6.0 cm (series 10, image 13). Mass follows fat signal on all
sequences. Mass is located deep to the sartorius and gracilis
muscles near the myotendinous junctions. There are a few subtle thin
internal septations. Subtle rounded 4 mm area of faint enhancement
within the posterior aspect of the mass proximally (series 17, image
9). No additional soft tissue masses. Nonspecific subcutaneous edema
throughout the knee.
IMPRESSION: 1. Circumscribed lobulated 9.0 x 5.5 x 6.0 cm fat signal mass at the
medial aspect of the knee deep to the sartorius and gracilis
muscles. Subtle rounded 4 mm area of faint enhancement within the
posterior aspect of the mass proximally. Findings suggestive of a
atypical lipomatous tumor.
2. Oblique tear extending to the inferior articular surface of the
medial meniscal body and posterior horn.
3. Mild tricompartmental osteoarthritis.
4. Small-to-moderate knee joint effusion and small leaking Baker's
cyst containing a loose body.

## 2021-06-06 ENCOUNTER — Encounter: Payer: Self-pay | Admitting: Family Medicine

## 2021-06-08 ENCOUNTER — Ambulatory Visit (INDEPENDENT_AMBULATORY_CARE_PROVIDER_SITE_OTHER): Payer: Commercial Managed Care - PPO

## 2021-06-08 ENCOUNTER — Ambulatory Visit: Payer: Commercial Managed Care - PPO | Admitting: Family Medicine

## 2021-06-08 ENCOUNTER — Encounter: Payer: Self-pay | Admitting: Family Medicine

## 2021-06-08 DIAGNOSIS — I10 Essential (primary) hypertension: Secondary | ICD-10-CM

## 2021-06-08 DIAGNOSIS — I2510 Atherosclerotic heart disease of native coronary artery without angina pectoris: Secondary | ICD-10-CM

## 2021-06-08 DIAGNOSIS — Z9861 Coronary angioplasty status: Secondary | ICD-10-CM

## 2021-06-08 DIAGNOSIS — R7303 Prediabetes: Secondary | ICD-10-CM

## 2021-06-08 DIAGNOSIS — F17210 Nicotine dependence, cigarettes, uncomplicated: Secondary | ICD-10-CM

## 2021-06-08 DIAGNOSIS — R600 Localized edema: Secondary | ICD-10-CM | POA: Diagnosis not present

## 2021-06-08 DIAGNOSIS — M25511 Pain in right shoulder: Secondary | ICD-10-CM

## 2021-06-08 DIAGNOSIS — L918 Other hypertrophic disorders of the skin: Secondary | ICD-10-CM

## 2021-06-08 DIAGNOSIS — G8929 Other chronic pain: Secondary | ICD-10-CM

## 2021-06-08 LAB — BAYER DCA HB A1C WAIVED: HB A1C (BAYER DCA - WAIVED): 6.4 % — ABNORMAL HIGH (ref 4.8–5.6)

## 2021-06-08 MED ORDER — OZEMPIC (0.25 OR 0.5 MG/DOSE) 2 MG/1.5ML ~~LOC~~ SOPN: 0.2500 mg | PEN_INJECTOR | SUBCUTANEOUS | 0 refills | Status: AC

## 2021-06-08 NOTE — Patient Instructions (Signed)
Chronic Venous Insufficiency Chronic venous insufficiency is a condition where the leg veins cannot effectively pump blood from the legs to the heart. This happens when the vein walls are either stretched, weakened, or damaged, or when the valves inside the vein are damaged. With the right treatment, you should be able to continue with an active life. This condition is also called venous stasis. What are the causes? Common causes of this condition include: High blood pressure inside the veins (venous hypertension). Sitting or standing too long, causing increased blood pressure in the leg veins. A blood clot that blocks blood flow in a vein (deep vein thrombosis, DVT). Inflammation of a vein (phlebitis) that causes a blood clot to form. Tumors in the pelvis that cause blood to back up. What increases the risk? The following factors may make you more likely to develop this condition: Having a family history of this condition. Obesity. Pregnancy. Living without enough regular physical activity or exercise (sedentary lifestyle). Smoking. Having a job that requires long periods of standing or sitting in one place. Being a certain age. Women in their 40s and 50s and men in their 70s are more likely to develop this condition. What are the signs or symptoms? Symptoms of this condition include: Veins that are enlarged, bulging, or twisted (varicose veins). Skin breakdown or ulcers. Reddened skin or dark discoloration of skin on the leg between the knee and ankle. Brown, smooth, tight, and painful skin just above the ankle, usually on the inside of the leg (lipodermatosclerosis). Swelling of the legs. How is this diagnosed? This condition may be diagnosed based on: Your medical history. A physical exam. Tests, such as: A procedure that creates an image of a blood vessel and nearby organs and provides information about blood flow through the blood vessel (duplex ultrasound). A procedure that  tests blood flow (plethysmography). A procedure that looks at the veins using X-ray and dye (venogram). How is this treated? The goals of treatment are to help you return to an active life and to minimize pain or disability. Treatment depends on the severity of your condition, and it may include: Wearing compression stockings. These can help relieve symptoms and help prevent your condition from getting worse. However, they do not cure the condition. Sclerotherapy. This procedure involves an injection of a solution that shrinks damaged veins. Surgery. This may involve: Removing a diseased vein (vein stripping). Cutting off blood flow through the vein (laser ablation surgery). Repairing or reconstructing a valve within the affected vein. Follow these instructions at home:     Wear compression stockings as told by your health care provider. These stockings help to prevent blood clots and reduce swelling in your legs. Take over-the-counter and prescription medicines only as told by your health care provider. Stay active by exercising, walking, or doing different activities. Ask your health care provider what activities are safe for you and how much exercise you need. Drink enough fluid to keep your urine pale yellow. Do not use any products that contain nicotine or tobacco, such as cigarettes, e-cigarettes, and chewing tobacco. If you need help quitting, ask your health care provider. Keep all follow-up visits as told by your health care provider. This is important. Contact a health care provider if you: Have redness, swelling, or more pain in the affected area. See a red streak or line that goes up or down from the affected area. Have skin breakdown or skin loss in the affected area, even if the breakdown is small. Get   an injury in the affected area. Get help right away if: You get an injury and an open wound in the affected area. You have: Severe pain that does not get better with  medicine. Sudden numbness or weakness in the foot or ankle below the affected area. Trouble moving your foot or ankle. A fever. Worse or persistent symptoms. Chest pain. Shortness of breath. Summary Chronic venous insufficiency is a condition where the leg veins cannot effectively pump blood from the legs to the heart. Chronic venous insufficiency occurs when the vein walls become stretched, weakened, or damaged, or when valves within the vein are damaged. Treatment depends on how severe your condition is. It often involves wearing compression stockings and may involve having a procedure. Make sure you stay active by exercising, walking, or doing different activities. Ask your health care provider what activities are safe for you and how much exercise you need. This information is not intended to replace advice given to you by your health care provider. Make sure you discuss any questions you have with your health care provider. Document Revised: 04/13/2020 Document Reviewed: 04/13/2020 Elsevier Patient Education  2023 [employer]  

## 2021-06-08 NOTE — Progress Notes (Signed)
? ?Subjective: ?CC: Lower extremity edema ?PCP: Janora Norlander, DO ?DDU:KGURKYH [place] is a 61 y.o. male presenting to clinic today for: ? ?1.  Lower extremity edema ?Patient presents for bilateral lower extremity edema that has been present for the last couple of weeks.  He does not add much salt to his food but does primarily work in a seated stance in his vehicle.  He does not necessarily get vigorous exercise on a routine basis unless he is working outside and during that time sometimes he feels dyspneic.  He admits that he has continued to smoke and has been a smoker for well over 40 years.  Currently smoking about 1.5 packs per day.  No hemoptysis or unplanned weight loss reported.  He is coming up due to see his cardiologist.  Has a history of CAD status post intervention.  Questionable orthopnea. ? ?2.  Morbid obesity ?Patient had some success with Nutrisystem but he and his wife has since discontinued that he has gained his weight back.  He is interested in weight loss.  He has a history of prediabetes borderline diabetes with A1c of 7.0 and 6.2 in the last 2 checkups.  Interested in [place] and no apparent contraindications to use. ? ?3.  Right shoulder pain ?Patient reports some right shoulder pain that is more exacerbated with certain movements and rolling onto that side.  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  Does not report weakness or sensory changes in that arm but wanted to make mention of this. ? ?4.  Tobacco use disorder ?Patient is interested in stopping smoking.  Again he does not report any hemoptysis but does report some dyspnea on exertion sometimes.  He would like to proceed with low-dose CAT scan for lung cancer screening as he has been a smoker since he was a teenager and has smoked consistently 1.5 packs/day. ? ? ?ROS: Per HPI ? ?No Known Allergies ?Past Medical History:  ?Diagnosis Date  ? Allergy   ? occasionally    ? CAD in native artery   ? a. 2008 - Single-vessel CAD with stent placement to the circumflex  b. 2012 - DES to Diag.  Scott. 06/2014 - RCA 25% stenosis, LAD 30% stenosis, ramus intermediate 45% stenosis, D1 95% stenosis treated with DES  d.01/2015 - nonobstructive CAD  ? GERD (gastroesophageal reflux disease)   ? Hyperlipidemia, mixed   ? Hypertension   ? Myocardial infarction [facility]) 2008  ? Overweight(278.02)   ? Sleep apnea   ? CPAP  ? ? ?Current Outpatient Medications:  ?  amLODipine (NORVASC) 10 MG tablet, Take 1 tablet (10 mg total) by mouth daily., Disp: 90 tablet, Rfl: 3 ?  aspirin 81 MG chewable tablet, Chew 81 mg by mouth daily., Disp: , Rfl:  ?  atorvastatin (LIPITOR) 40 MG tablet, TAKE 1 TABLET BY MOUTH IN THE EVENING AT  6  PM, Disp: 90 tablet, Rfl: 3 ?  baclofen (LIORESAL) 10 MG tablet, Take 1 tablet (10 mg total) by mouth 3 (three) times daily., Disp: 30 each, Rfl: 0 ?  buPROPion (WELLBUTRIN XL) 150 MG 24 hr tablet, TAKE 1 TABLET BY MOUTH ONCE DAILY IN THE MORNING ., Disp: 90 tablet, Rfl: 3 ?  fluticasone (FLONASE) 50 MCG/ACT nasal spray, Place 2 sprays into both nostrils daily., Disp: 16 g, Rfl: 6 ?  hydrochlorothiazide (HYDRODIURIL) 25 MG tablet, Take 1 tablet (25 mg total) by  mouth daily., Disp: 90 tablet, Rfl: 3 ?  lisinopril (ZESTRIL) 40 MG tablet, Take 1 tablet (40 mg total) by mouth daily., Disp: 90 tablet, Rfl: 3 ?  metoprolol tartrate (LOPRESSOR) 50 MG tablet, Take 1 tablet (50 mg total) by mouth 2 (two) times daily., Disp: 180 tablet, Rfl: 3 ?  nitroGLYCERIN (NITROSTAT) 0.4 MG SL tablet, Place 1 tablet (0.4 mg total) under the tongue every 5 (five) minutes x 3 doses as needed for chest pain., Disp: 25 tablet, Rfl: 1 ?Social History  ? ?Socioeconomic History  ? Marital status: Married  ?  Spouse name: Not on file  ? Number of children: Not on file  ? Years of education: Not on file  ? Highest education level: Not on file  ?Occupational History  ? Occupation: Full time  ?  Employer: [employer]  ?Tobacco Use  ? Smoking status: Former  ?  Types: Cigarettes  ?  Start date: 03/20/2012  ?  Quit date: 01/30/2017  ?  Years since quitting: 4.3  ? Smokeless tobacco: Never  ?Vaping Use  ? Vaping Use: Never used  ?Substance and Sexual Activity  ? Alcohol use: Yes  ?  Alcohol/week: 0.0 standard drinks  ?  Comment: occ  ? Drug use: No  ? Sexual activity: Not on file  ?Other Topics Concern  ? Not on file  ?Social History Narrative  ? Married  ? No regular exercise  ? ?Social Determinants of Health  ? ?Financial Resource Strain: Not on file  ?Food Insecurity: Not on file  ?Transportation Needs: Not on file  ?Physical Activity: Not on file  ?Stress: Not on file  ?Social Connections: Not on file  ?Intimate Partner Violence: Not on file  ? ?Family History  ?Problem Relation Age of Onset  ? Heart disease Mother   ? Colon polyps Father   ? Coronary artery disease Other   ? Colon polyps Sister   ? Colon cancer Neg Hx   ? Esophageal cancer Neg Hx   ? Rectal cancer Neg Hx   ? Stomach cancer Neg Hx   ? ? ?Objective: ?Office vital signs reviewed. ?BP (!) 145/89   Pulse 69   Temp 98 ?F (36.7 ?Scott)   Ht 6' (1.829 m)   Wt 291 lb (132 kg)   SpO2 95%   BMI 39.47 kg/m?  ? ?Physical Examination:  ?General: Awake, alert, morbidly obese, No acute distress ?HEENT: Sclera injected.  He has skin tags along the lower eyelids. ?Cardio: regular rate and rhythm, S1S2 heard, no murmurs appreciated ?Pulm: clear to auscultation bilaterally, no wheezes, rhonchi or rales; normal work of breathing on room air ?Extremities: warm, well perfused, trace to 1+ pitting edema to mid shins bilaterally, no cyanosis or clubbing  ?MSK: Ambulating independently.  Active range of motion of right shoulder is somewhat limited secondary to pain ? ?Assessment/ Plan: ?61 y.o. male  ? ?Morbid obesity ([place]) - Plan: CMP14+EGFR, TSH, Bayer DCA Hb A1c Waived, Semaglutide,0.25 or 0.5MG/DOS, (OZEMPIC, 0.25 OR 0.5 MG/DOSE,) 2 MG/1.5ML SOPN ? ?Pre-diabetes - Plan:  Bayer DCA Hb A1c Waived, Semaglutide,0.25 or 0.5MG/DOS, (OZEMPIC, 0.25 OR 0.5 MG/DOSE,) 2 MG/1.5ML SOPN ? ?Essential hypertension - Plan: CMP14+EGFR ? ?Smokes less than 2 packs a day with greater than 30 pack year history - Plan: CBC, CT CHEST LUNG CA SCREEN LOW DOSE W/O CM ? ?Bilateral leg edema - Plan: CMP14+EGFR, TSH, CBC, Brain natriuretic peptide, DG Chest 2 View, Compression stockings ? ?CAD S/P percutaneous coronary angioplasty -  Plan: DG Chest 2 View, Semaglutide,0.25 or 0.5MG/DOS, (OZEMPIC, 0.25 OR 0.5 MG/DOSE,) 2 MG/1.5ML SOPN ? ?Chronic right shoulder pain ? ?Acquired skin tag ? ?He has technically had an A1c can some type 2 diabetes several years ago with A1c of 7.0.  Last year we checked this it was 6.2.  He continues to have some issues with weight and I would not be surprised if in fact he is a type II diabetic today.  I am going to go ahead and start him on Ozempic sample today in anticipation that he will need some type of weight management going forward.  We discussed that this does reduce cardiovascular risk by about 30% as well so this may be of benefit to him in the setting of history of CAD, ongoing smoking and multiple other risk factors for cardiovascular disease. ? ?We discussed consideration for something like Chantix for smoking cessation.  Though again I did discuss with him that this GLP is in fact to being studied in alcohol use disorder and tobacco use disorders I be very interested to see if perhaps this might even help him start stopping smoking.  First injection of 0.25 mg subcutaneously was performed today.  He will repeat in 1 week. ? ?His blood pressure is not quite at goal but I am going to reassess him again in 1 month. ? ?His leg edema is likely secondary to venous stasis but I am going to check a BNP just to ensure that this is not cardiac mediated.  His pulmonary exam was unremarkable.  I am going to obtain a chest x-ray to further evaluate and ensure that there is not any  evidence of fluid overload.  I did offer Lasix but he did not really want any more urine output so for now we will use compression hose.  We discussed reduction in salt intake, elevation of legs.  I think with some

## 2021-06-09 LAB — CMP14+EGFR
ALT: 35 IU/L (ref 0–44)
AST: 20 IU/L (ref 0–40)
Albumin/Globulin Ratio: 1.9 (ref 1.2–2.2)
Albumin: 4.5 g/dL (ref 3.8–4.9)
Alkaline Phosphatase: 90 IU/L (ref 44–121)
BUN/Creatinine Ratio: 11 (ref 10–24)
BUN: 10 mg/dL (ref 8–27)
Bilirubin Total: 0.6 mg/dL (ref 0.0–1.2)
CO2: 21 mmol/L (ref 20–29)
Calcium: 9.4 mg/dL (ref 8.6–10.2)
Chloride: 99 mmol/L (ref 96–106)
Creatinine, Ser: 0.91 mg/dL (ref 0.76–1.27)
Globulin, Total: 2.4 g/dL (ref 1.5–4.5)
Glucose: 77 mg/dL (ref 70–99)
Potassium: 4.3 mmol/L (ref 3.5–5.2)
Sodium: 141 mmol/L (ref 134–144)
Total Protein: 6.9 g/dL (ref 6.0–8.5)
eGFR: 96 mL/min/{1.73_m2} (ref >59)

## 2021-06-09 LAB — CBC
Hematocrit: 52.1 % — ABNORMAL HIGH (ref 37.5–51.0)
Hemoglobin: 17.9 g/dL — ABNORMAL HIGH (ref 13.0–17.7)
MCH: 32.4 pg (ref 26.6–33.0)
MCHC: 34.4 g/dL (ref 31.5–35.7)
MCV: 94 fL (ref 79–97)
Platelets: 251 10*3/uL (ref 150–450)
RBC: 5.52 x10E6/uL (ref 4.14–5.80)
RDW: 11.7 % (ref 11.6–15.4)
WBC: 8.9 10*3/uL (ref 3.4–10.8)

## 2021-06-09 LAB — BRAIN NATRIURETIC PEPTIDE: BNP: 36.6 pg/mL (ref 0.0–100.0)

## 2021-06-09 LAB — TSH: TSH: 2.62 u[IU]/mL (ref 0.450–4.500)

## 2021-06-10 ENCOUNTER — Encounter: Payer: Self-pay | Admitting: Emergency Medicine

## 2021-06-29 ENCOUNTER — Encounter (HOSPITAL_COMMUNITY): Payer: Self-pay

## 2021-06-29 NOTE — Progress Notes (Signed)
[facility] referral received. Attempted to reach patient but was unable to do so. Detailed VM left asking that the patient return my call. ? ?

## 2021-07-20 ENCOUNTER — Ambulatory Visit (INDEPENDENT_AMBULATORY_CARE_PROVIDER_SITE_OTHER): Payer: Commercial Managed Care - PPO | Admitting: Family Medicine

## 2021-07-20 VITALS — BP 157/102 | HR 79 | Temp 97.6°F | Ht 72.0 in | Wt 284.2 lb

## 2021-07-20 DIAGNOSIS — E119 Type 2 diabetes mellitus without complications: Secondary | ICD-10-CM

## 2021-07-20 DIAGNOSIS — R0789 Other chest pain: Secondary | ICD-10-CM | POA: Diagnosis not present

## 2021-07-20 DIAGNOSIS — R5383 Other fatigue: Secondary | ICD-10-CM | POA: Diagnosis not present

## 2021-07-20 DIAGNOSIS — F17200 Nicotine dependence, unspecified, uncomplicated: Secondary | ICD-10-CM

## 2021-07-20 DIAGNOSIS — R0602 Shortness of breath: Secondary | ICD-10-CM

## 2021-07-20 DIAGNOSIS — Z0001 Encounter for general adult medical examination with abnormal findings: Secondary | ICD-10-CM

## 2021-07-20 DIAGNOSIS — Z9861 Coronary angioplasty status: Secondary | ICD-10-CM

## 2021-07-20 DIAGNOSIS — E785 Hyperlipidemia, unspecified: Secondary | ICD-10-CM

## 2021-07-20 DIAGNOSIS — I2510 Atherosclerotic heart disease of native coronary artery without angina pectoris: Secondary | ICD-10-CM

## 2021-07-20 DIAGNOSIS — K5909 Other constipation: Secondary | ICD-10-CM

## 2021-07-20 DIAGNOSIS — I152 Hypertension secondary to endocrine disorders: Secondary | ICD-10-CM

## 2021-07-20 DIAGNOSIS — Z0000 Encounter for general adult medical examination without abnormal findings: Secondary | ICD-10-CM

## 2021-07-20 DIAGNOSIS — G8929 Other chronic pain: Secondary | ICD-10-CM

## 2021-07-20 DIAGNOSIS — E1169 Type 2 diabetes mellitus with other specified complication: Secondary | ICD-10-CM

## 2021-07-20 DIAGNOSIS — I252 Old myocardial infarction: Secondary | ICD-10-CM | POA: Diagnosis not present

## 2021-07-20 DIAGNOSIS — Z125 Encounter for screening for malignant neoplasm of prostate: Secondary | ICD-10-CM | POA: Diagnosis not present

## 2021-07-20 DIAGNOSIS — E1159 Type 2 diabetes mellitus with other circulatory complications: Secondary | ICD-10-CM

## 2021-07-20 DIAGNOSIS — Z7985 Long-term (current) use of injectable non-insulin antidiabetic drugs: Secondary | ICD-10-CM | POA: Insufficient documentation

## 2021-07-20 DIAGNOSIS — M25511 Pain in right shoulder: Secondary | ICD-10-CM

## 2021-07-20 MED ORDER — OZEMPIC (0.25 OR 0.5 MG/DOSE) 2 MG/3ML ~~LOC~~ SOPN: 0.5000 mg | PEN_INJECTOR | SUBCUTANEOUS | 3 refills | Status: DC

## 2021-07-20 MED ORDER — LINACLOTIDE 72 MCG PO CAPS: 72.0000 ug | ORAL_CAPSULE | Freq: Every day | ORAL | 0 refills | Status: DC

## 2021-07-20 MED ORDER — BREZTRI AEROSPHERE 160-9-4.8 MCG/ACT IN AERO: 2.0000 | INHALATION_SPRAY | Freq: Two times a day (BID) | RESPIRATORY_TRACT | 11 refills | Status: DC

## 2021-07-20 NOTE — Progress Notes (Signed)
CORNELIO Hatfield is a 61 y.o. male presents to office today for annual physical exam examination.    Concerns today include: 1. Fatigue Having several days of fatigue.  No bleeding, no URI symptoms.  Compliant with CPAP every night.  No new supplies needed.  Continues to smoke and has had some chronic shortness of breath.  No edema.  No orthopnea.  TSH normal in April.  CBC with no evidence of anemia.  2. Constipation Chronic issue somewhat exacerbated by ozempic.  Using Fibercon capsules and drinking plenty of water.  No blood in stool.  3. DM w/ HTN/ HLD and CAD Patient technically with diet-controlled diabetes based on A1c in 2021.  His A1c at last visit was 6.4.  Ozempic was initiated to help reduce cardiovascular risk and promote tighter glycemic control.  He notes that he has been tolerating the medication essentially without difficulty.  He does notice a difference in his appetite and gets nauseated if he overeats.  No vomiting.  Has had some increased constipation as above.  He does report some intermittent left arm pain but it only affects his left upper arm and sometimes feels a little bit of chest aching.  He does not feel like when he had his heart attack but sometimes he wonders.  He has not seen his cardiologist in a while and has been waiting for a call for his annual checkup.  He has had his eye exam and had recent glasses.  No neuropathic changes reported.  He has not heard back to schedule the CT lung cancer screening  4.  Right shoulder pain Ongoing right shoulder pain that radiates up into the right side of the neck.  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  This was similar to what was removed from his medial knee at [facility].  He would like to see somebody for that right shoulder pain as sometimes it really is severe and interferes with his sleep.  He is right-hand dominant.  No sensory changes reported.  Occupation: drives a lot for work, Marital status: married,  Substance use: tobacco Diet: Fair, Exercise: No structured Last eye exam: Up-to-date Last dental exam: Up-to-date Last colonoscopy: Up-to-date Refills needed today: None Immunizations needed: Pneumococcal vaccination will be needed Immunization History  Administered Date(s) Administered   Influenza, Seasonal, Injecte, Preservative Fre 12/03/2018   Influenza,inj,Quad PF,6+ Mos 12/08/2014, 04/04/2016, 12/25/2016, 12/28/2017   Tdap 12/28/2017     Past Medical History:  Diagnosis Date   Allergy    occasionally    CAD in native artery    a. 2008 - Single-vessel CAD with stent placement to the circumflex  b. 2012 - DES to Diag.  c. 06/2014 - RCA 25% stenosis, LAD 30% stenosis, ramus intermediate 45% stenosis, D1 95% stenosis treated with DES  d.01/2015 - nonobstructive CAD   GERD (gastroesophageal reflux disease)    Hyperlipidemia, mixed    Hypertension    Myocardial infarction ([place]) 2008   Overweight(278.02)    Sleep apnea    CPAP   Social History   Socioeconomic History   Marital status: Married    Spouse name: Not on file   Number of children: Not on file   Years of education: Not on file   Highest education level: Not on file  Occupational History   Occupation: Full time    Employer: [employer]  Tobacco Use   Smoking status: Former    Types: Cigarettes    Start date: 03/20/2012  Quit date: 01/30/2017    Years since quitting: 4.4   Smokeless tobacco: Never  Vaping Use   Vaping Use: Never used  Substance and Sexual Activity   Alcohol use: Yes    Alcohol/week: 0.0 standard drinks    Comment: occ   Drug use: No   Sexual activity: Not on file  Other Topics Concern   Not on file  Social History Narrative   Married   No regular exercise   Social Determinants of Health   Financial Resource Strain: Not on file  Food Insecurity: Not on file  Transportation Needs: Not on file  Physical Activity: Not on file  Stress: Not on file  Social Connections: Not  on file  Intimate Partner Violence: Not on file   Past Surgical History:  Procedure Laterality Date   CARDIAC CATHETERIZATION N/A 07/03/2014   Procedure: Left Heart Cath and Coronary Angiography;  Surgeon: Peter M Martinique, MD;  Location: [facility];  Service: Cardiovascular;  Laterality: N/A;   CARDIAC CATHETERIZATION N/A 07/03/2014   Procedure: Coronary Stent Intervention;  Surgeon: Peter M Martinique, MD;  Location: [place] [facility];  Service: Cardiovascular;  Laterality: N/A;   CARDIAC CATHETERIZATION N/A 01/26/2015   Procedure: Left Heart Cath and Coronary Angiography;  Surgeon: Peter M Martinique, MD;  Location: [place] [facility];  Service: Cardiovascular;  Laterality: N/A;   CORONARY ANGIOPLASTY     2016     3 stents   CORONARY STENT PLACEMENT  2008, 2012   MASS EXCISION N/A 10/24/2019   Procedure: EXCISION; CYST; RIGHT THIGH; PERINEUM;  Surgeon: Virl Cagey, MD;  Location: [facility];  Service: General;  Laterality: N/A;   wrist  surgery     Family History  Problem Relation Age of Onset   Heart disease Mother    Colon polyps Father    Coronary artery disease Other    Colon polyps Sister    Colon cancer Neg Hx    Esophageal cancer Neg Hx    Rectal cancer Neg Hx    Stomach cancer Neg Hx     Current Outpatient Medications:    amLODipine (NORVASC) 10 MG tablet, Take 1 tablet (10 mg total) by mouth daily., Disp: 90 tablet, Rfl: 3   aspirin 81 MG chewable tablet, Chew 81 mg by mouth daily., Disp: , Rfl:    atorvastatin (LIPITOR) 40 MG tablet, TAKE 1 TABLET BY MOUTH IN THE EVENING AT  6  PM, Disp: 90 tablet, Rfl: 3   baclofen (LIORESAL) 10 MG tablet, Take 1 tablet (10 mg total) by mouth 3 (three) times daily., Disp: 30 each, Rfl: 0   buPROPion (WELLBUTRIN XL) 150 MG 24 hr tablet, TAKE 1 TABLET BY MOUTH ONCE DAILY IN THE MORNING ., Disp: 90 tablet, Rfl: 3   fluticasone (FLONASE) 50 MCG/ACT nasal spray, Place 2 sprays into both nostrils daily., Disp: 16 g, Rfl: 6    hydrochlorothiazide (HYDRODIURIL) 25 MG tablet, Take 1 tablet (25 mg total) by mouth daily., Disp: 90 tablet, Rfl: 3   lisinopril (ZESTRIL) 40 MG tablet, Take 1 tablet (40 mg total) by mouth daily., Disp: 90 tablet, Rfl: 3   metoprolol tartrate (LOPRESSOR) 50 MG tablet, Take 1 tablet (50 mg total) by mouth 2 (two) times daily., Disp: 180 tablet, Rfl: 3   nitroGLYCERIN (NITROSTAT) 0.4 MG SL tablet, Place 1 tablet (0.4 mg total) under the tongue every 5 (five) minutes x 3 doses as needed for chest pain., Disp: 25 tablet, Rfl: 1  No Known Allergies   ROS: Review of Systems Pertinent items noted in HPI and remainder of comprehensive ROS otherwise negative.    Physical exam BP (!) 157/102   Pulse 79   Temp 97.6 F (36.4 C)   Ht 6' (1.829 m)   Wt 284 lb 3.2 oz (128.9 kg)   SpO2 95%   BMI 38.54 kg/m  General appearance: alert, cooperative, appears stated age, and morbidly obese Head: Normocephalic, without obvious abnormality, atraumatic Eyes: negative findings: lids and lashes normal, corneas clear, and pupils equal, round, reactive to light and accomodation Ears: normal TM's and external ear canals both ears Nose: Nares normal. Septum midline. Mucosa normal. No drainage or sinus tenderness. Throat: Oropharynx without erythema.  No oropharyngeal or sublingual masses appreciated.  No ulcerations Neck: no adenopathy, no carotid bruit, supple, symmetrical, trachea midline, and thyroid not enlarged, symmetric, no tenderness/mass/nodules Back: symmetric, no curvature. ROM normal. No CVA tenderness. Lungs:  Mild intermittent wheezes noted at the right lung base.  Normal work of breathing on room air with normal oxygen saturation on Chest wall: no tenderness Heart: regular rate and rhythm, S1, S2 normal, no murmur, click, rub or gallop Abdomen:  Protuberant, obese.  Seemingly slight enlargement of the liver palpable on exam.  Though exam is limited by body habitus Extremities: extremities normal,  atraumatic, no cyanosis or edema Pulses: 2+ and symmetric Skin: Skin color, texture, turgor normal. No rashes or lesions or has a well-circumscribed, rounded, rubbery mass noted along the right shoulder over the anterior lateral deltoid Lymph nodes: Cervical, supraclavicular, and axillary nodes normal. Neurologic: Grossly normal Psych: Mood stable, speech normal     06/08/2021    3:23 PM 06/11/2020    1:23 PM 03/15/2020    2:29 PM  Depression screen PHQ 2/9  Decreased Interest 0 0 0  Down, Depressed, Hopeless 0 0 0  PHQ - 2 Score 0 0 0      06/08/2021    3:23 PM  GAD 7 : Generalized Anxiety Score  Nervous, Anxious, on Edge 0  Control/stop worrying 0  Worry too much - different things 0  Trouble relaxing 0  Restless 0  Easily annoyed or irritable 0  Afraid - awful might happen 0  Total GAD 7 Score 0  Anxiety Difficulty Not difficult at all   Assessment/ Plan: Carolan Clines here for annual physical exam.   Annual physical exam  Screening for malignant neoplasm of prostate - Plan: PSA  Fatigue, unspecified type - Plan: Testosterone, T4, Free, Vitamin B12, CMP14+EGFR  Tobacco use disorder - Plan: Budeson-Glycopyrrol-Formoterol (BREZTRI AEROSPHERE) 160-9-4.8 MCG/ACT AERO  Shortness of breath - Plan: Budeson-Glycopyrrol-Formoterol (BREZTRI AEROSPHERE) 160-9-4.8 MCG/ACT AERO  Chronic constipation - Plan: linaclotide (LINZESS) 72 MCG capsule  Atypical chest pain - Plan: EKG 12-Lead  History of myocardial infarction - Plan: EKG 12-Lead  CAD S/P percutaneous coronary angioplasty - Plan: Semaglutide,0.25 or 0.5MG/DOS, (OZEMPIC, 0.25 OR 0.5 MG/DOSE,) 2 MG/3ML SOPN  Diet-controlled diabetes mellitus ([place]) - Plan: Semaglutide,0.25 or 0.5MG/DOS, (OZEMPIC, 0.25 OR 0.5 MG/DOSE,) 2 MG/3ML SOPN, CMP14+EGFR  Hypertension associated with type 2 diabetes mellitus ([place]) - Plan: Semaglutide,0.25 or 0.5MG/DOS, (OZEMPIC, 0.25 OR 0.5 MG/DOSE,) 2 MG/3ML SOPN, CMP14+EGFR  Hyperlipidemia  associated with type 2 diabetes mellitus ([facility]) - Plan: Semaglutide,0.25 or 0.5MG/DOS, (OZEMPIC, 0.25 OR 0.5 MG/DOSE,) 2 MG/3ML SOPN, CMP14+EGFR, Lipid Panel  Chronic right shoulder pain  We will check for metabolic etiology of sudden onset of fatigue including testosterone levels, metabolic panel.  No evidence of  anemia on CBC.  TSH was normal on recent laboratory work-up but T4 has been ordered given reports of constipation as well.  EKG obtained given some atypical chest pain which demonstrated no evidence of ischemia.  However I did encourage him to continue following up with cardiology as directed  With regards to his shortness of breath I have given him samples of Breztri to try.  I do wonder if there is an underlying COPD that has not been identified.  Still awaiting that low-dose CT scan to evaluate for emphysema/cancer screening.  I have followed up with scheduling to determine why he has not been contacted and they will reach out to him soon.  For the chronic constipation have given him samples of Linzess.  He will advance doses pending response and let me know what works well for him so I can send a prescription  Ozempic advanced to 0.5 mg subcutaneously each week.  Rx has been sent to pharmacy.  Would like to follow-up with him in 2 months for A1c recheck and weight weigh-in.  For his right shoulder pain, I have recommended that he see Dr Amedeo Kinsman.  Not sure how much of this pain is related to the lipoma he has in his deltoid region or if this is in fact mediated by some type of cervical spine etiology.  Counseled on healthy lifestyle choices, including diet (rich in fruits, vegetables and lean meats and low in salt and simple carbohydrates) and exercise (at least 30 minutes of moderate physical activity daily).    Benancio Osmundson M. Lajuana Ripple, DO

## 2021-07-20 NOTE — Patient Instructions (Signed)
Schedule visit with cardiology I'm checking in on the CT of the lungs.  You should have been called already about this Breztri inhaler given as a sample today.  If this is helpful with shortness of breath, let me know.  Make sure to use 2 puffs twice daily every day even if you feel good.  Rinse mouth after each use STOP SMOKING Linzess for constipation.  Just take ONE capsule a day.  If the low dose doesn't work well, ok to proceed to the next dose the following day.  Let me know what dose works well for you and I will prescribe Checking multiple labs to determine why you are tired including testosterone Ozempic increased to 0.'5mg'$  weekly.  Technically, you are considered a diet controlled diabetic based on the A1c 2 years ago of (7.0).  See me back in 2 months for A1c recheck and weight recheck. Dr Amedeo Kinsman will see you about that right shoulder next week.

## 2021-07-21 ENCOUNTER — Telehealth: Payer: Self-pay | Admitting: Pharmacist

## 2021-07-21 DIAGNOSIS — R7303 Prediabetes: Secondary | ICD-10-CM | POA: Insufficient documentation

## 2021-07-21 DIAGNOSIS — E119 Type 2 diabetes mellitus without complications: Secondary | ICD-10-CM

## 2021-07-21 LAB — CMP14+EGFR
ALT: 33 IU/L (ref 0–44)
AST: 16 IU/L (ref 0–40)
Albumin/Globulin Ratio: 1.9 (ref 1.2–2.2)
Albumin: 4.4 g/dL (ref 3.8–4.9)
Alkaline Phosphatase: 76 IU/L (ref 44–121)
BUN/Creatinine Ratio: 11 (ref 10–24)
BUN: 9 mg/dL (ref 8–27)
Bilirubin Total: 0.4 mg/dL (ref 0.0–1.2)
CO2: 23 mmol/L (ref 20–29)
Calcium: 9.5 mg/dL (ref 8.6–10.2)
Chloride: 103 mmol/L (ref 96–106)
Creatinine, Ser: 0.79 mg/dL (ref 0.76–1.27)
Globulin, Total: 2.3 g/dL (ref 1.5–4.5)
Glucose: 103 mg/dL — ABNORMAL HIGH (ref 70–99)
Potassium: 5.1 mmol/L (ref 3.5–5.2)
Sodium: 140 mmol/L (ref 134–144)
Total Protein: 6.7 g/dL (ref 6.0–8.5)
eGFR: 102 mL/min/{1.73_m2} (ref >59)

## 2021-07-21 LAB — TESTOSTERONE: Testosterone: 387 ng/dL (ref 264–916)

## 2021-07-21 LAB — VITAMIN B12: Vitamin B-12: 424 pg/mL (ref 232–1245)

## 2021-07-21 LAB — LIPID PANEL
Chol/HDL Ratio: 3.9 ratio (ref 0.0–5.0)
Cholesterol, Total: 160 mg/dL (ref 100–199)
HDL: 41 mg/dL (ref >39)
LDL Chol Calc (NIH): 101 mg/dL — ABNORMAL HIGH (ref 0–99)
Triglycerides: 99 mg/dL (ref 0–149)
VLDL Cholesterol Cal: 18 mg/dL (ref 5–40)

## 2021-07-21 LAB — T4, FREE: Free T4: 1.24 ng/dL (ref 0.82–1.77)

## 2021-07-21 LAB — PSA: Prostate Specific Ag, Serum: 0.4 ng/mL (ref 0.0–4.0)

## 2021-07-21 NOTE — Telephone Encounter (Signed)
Insurance will only cover after metformin for T2DM

## 2021-07-21 NOTE — Telephone Encounter (Signed)
[email or web address]  PA SUBMITTED VIA WEBSITE ABOVE PER INSURANCE CAN'T COMPLETE ON COVER MY MEDS  CLINICAL NOTES ATTACHED

## 2021-07-21 NOTE — Telephone Encounter (Signed)
Prior Scott Hatfield is being completed

## 2021-07-21 NOTE — Telephone Encounter (Signed)
PREDIABETES R73.03 ACS/CARDIAC I24.9, I25.10

## 2021-07-22 MED ORDER — METFORMIN HCL 500 MG PO TABS: 500.0000 mg | ORAL_TABLET | Freq: Every day | ORAL | 0 refills | Status: DC

## 2021-07-22 NOTE — Telephone Encounter (Signed)
PT AWARE  

## 2021-07-22 NOTE — Addendum Note (Signed)
Addended by: Janora Norlander on: 07/22/2021 07:21 AM   Modules accepted: Orders

## 2021-07-22 NOTE — Telephone Encounter (Signed)
Metformin sent. Please inform pt to pick up metformin. Pt has CAD/ DM2. Please try resubmitting in 1 week.

## 2021-07-24 ENCOUNTER — Other Ambulatory Visit: Payer: Self-pay | Admitting: Family Medicine

## 2021-07-24 DIAGNOSIS — I10 Essential (primary) hypertension: Secondary | ICD-10-CM

## 2021-07-25 ENCOUNTER — Ambulatory Visit: Payer: Commercial Managed Care - PPO | Admitting: Orthopedic Surgery

## 2021-07-25 ENCOUNTER — Ambulatory Visit (INDEPENDENT_AMBULATORY_CARE_PROVIDER_SITE_OTHER): Payer: Commercial Managed Care - PPO

## 2021-07-25 ENCOUNTER — Encounter: Payer: Self-pay | Admitting: Orthopedic Surgery

## 2021-07-25 DIAGNOSIS — M25511 Pain in right shoulder: Secondary | ICD-10-CM

## 2021-07-25 DIAGNOSIS — G8929 Other chronic pain: Secondary | ICD-10-CM | POA: Diagnosis not present

## 2021-07-25 DIAGNOSIS — M7581 Other shoulder lesions, right shoulder: Secondary | ICD-10-CM

## 2021-07-25 NOTE — Patient Instructions (Signed)

## 2021-07-25 NOTE — Progress Notes (Signed)
New Patient Visit  Assessment: Scott Hatfield is a 61 y.o. male with the following: 1. Rotator cuff tendinitis, right  Plan: [name] has pain in his right shoulder.  Atraumatic onset.  He has good range of motion, and good strength.  Pain gets worse at night.  Medications are not providing sustained relief.  I do not think he has a significant rotator cuff injury due to his functionality.  Nonetheless, his rotator cuff does appear to be irritated.  I recommended a steroid injection, and he is elected to proceed.  Should he wish to work-up the lipomatous mass on the lateral shoulder, I am happy to see him in a time.  Procedure note injection - Right shoulder    Verbal consent was obtained to inject the right shoulder, subacromial space Timeout was completed to confirm the site of injection.   The skin was prepped with alcohol and ethyl chloride was sprayed at the injection site.  A 21-gauge needle was used to inject 40 mg of Depo-Medrol and 1% lidocaine (3 cc) into the subacromial space of the right shoulder using a posterolateral approach.  There were no complications.  A sterile bandage was applied.    Follow-up: Return if symptoms worsen or fail to improve.  Subjective:  Chief Complaint  Patient presents with   Shoulder Pain    Right shld pain x a few months, at joint and posteriorly, has a fatty lipoma lateral shld, been there for years.     History of Present Illness: Scott Hatfield is a 61 y.o. RHD male who has been referred by Scott Doss, DO for evaluation of right shoulder pain.  He states he had pain in the right shoulder for couple of months.  No specific injury.  Pain is in the lateral and posterior aspect of the right shoulder.  Increased pain with overhead motion, but otherwise he is able to do most things.  Pain gets worse at night.  It does affect how he sleeps.  He also describes some radiating pains distally into the upper arm.  He takes ibuprofen  occasionally.  He has never had an injection in his right shoulder.   Review of Systems: No fevers or chills No numbness or tingling No chest pain No shortness of breath No bowel or bladder dysfunction No GI distress No headaches   Medical History:  Past Medical History:  Diagnosis Date   Allergy    occasionally    CAD in native artery    a. 2008 - Single-vessel CAD with stent placement to the circumflex  b. 2012 - DES to Diag.  c. 06/2014 - RCA 25% stenosis, LAD 30% stenosis, ramus intermediate 45% stenosis, D1 95% stenosis treated with DES  d.01/2015 - nonobstructive CAD   GERD (gastroesophageal reflux disease)    Hyperlipidemia, mixed    Hypertension    Myocardial infarction ([place]) 2008   Overweight(278.02)    Sleep apnea    CPAP    Past Surgical History:  Procedure Laterality Date   CARDIAC CATHETERIZATION N/A 07/03/2014   Procedure: Left Heart Cath and Coronary Angiography;  Surgeon: Scott Hatfield;  Location: [place];  Service: Cardiovascular;  Laterality: N/A;   CARDIAC CATHETERIZATION N/A 07/03/2014   Procedure: Coronary Stent Intervention;  Surgeon: Scott Hatfield;  Location: [place] [facility];  Service: Cardiovascular;  Laterality: N/A;   CARDIAC CATHETERIZATION N/A 01/26/2015   Procedure: Left Heart Cath and Coronary Angiography;  Surgeon: Scott Hatfield;  Location: [facility];  Service: Cardiovascular;  Laterality: N/A;   CORONARY ANGIOPLASTY     2016     3 stents   CORONARY STENT PLACEMENT  2008, 2012   MASS EXCISION N/A 10/24/2019   Procedure: EXCISION; CYST; RIGHT THIGH; PERINEUM;  Surgeon: Scott Hatfield;  Location: [facility];  Service: General;  Laterality: N/A;   wrist  surgery      Family History  Problem Relation Age of Onset   Heart disease Mother    Colon polyps Father    Coronary artery disease Other    Colon polyps Sister    Colon cancer Neg Hx    Esophageal cancer Neg Hx    Rectal cancer Neg Hx    Stomach  cancer Neg Hx    Social History   Tobacco Use   Smoking status: Former    Types: Cigarettes    Start date: 03/20/2012    Quit date: 01/30/2017    Years since quitting: 4.4   Smokeless tobacco: Never  Vaping Use   Vaping Use: Never used  Substance Use Topics   Alcohol use: Yes    Alcohol/week: 0.0 standard drinks of alcohol    Comment: occ   Drug use: No    No Known Allergies  Current Meds  Medication Sig   amLODipine (NORVASC) 10 MG tablet Take 1 tablet (10 mg total) by mouth daily.   aspirin 81 MG chewable tablet Chew 81 mg by mouth daily.   atorvastatin (LIPITOR) 40 MG tablet TAKE 1 TABLET BY MOUTH IN THE EVENING AT  6  PM   baclofen (LIORESAL) 10 MG tablet Take 1 tablet (10 mg total) by mouth 3 (three) times daily.   Budeson-Glycopyrrol-Formoterol (BREZTRI AEROSPHERE) 160-9-4.8 MCG/ACT AERO Inhale 2 puffs into the lungs 2 (two) times daily.   buPROPion (WELLBUTRIN XL) 150 MG 24 hr tablet TAKE 1 TABLET BY MOUTH ONCE DAILY IN THE MORNING .   fluticasone (FLONASE) 50 MCG/ACT nasal spray Place 2 sprays into both nostrils daily.   hydrochlorothiazide (HYDRODIURIL) 25 MG tablet Take 1 tablet (25 mg total) by mouth daily.   linaclotide (LINZESS) 72 MCG capsule Take 1 capsule (72 mcg total) by mouth daily before breakfast.   lisinopril (ZESTRIL) 40 MG tablet Take 1 tablet by mouth once daily   metFORMIN (GLUCOPHAGE) 500 MG tablet Take 1 tablet (500 mg total) by mouth daily with breakfast.   metoprolol tartrate (LOPRESSOR) 50 MG tablet Take 1 tablet (50 mg total) by mouth 2 (two) times daily.   nitroGLYCERIN (NITROSTAT) 0.4 MG SL tablet Place 1 tablet (0.4 mg total) under the tongue every 5 (five) minutes x 3 doses as needed for chest pain.   Semaglutide,0.25 or 0.'5MG'$ /DOS, (OZEMPIC, 0.25 OR 0.5 MG/DOSE,) 2 MG/3ML SOPN Inject 0.5 mg into the skin every 7 (seven) days.    Objective: There were no vitals taken for this visit.  Physical Exam:  General: Alert and oriented. and No  acute distress. Gait: Normal gait.  Right shoulder without swelling.  No redness.  There is a soft, compressible mass in the lateral aspect of the shoulder.  He has near full range of motion with minimal pain.  Mild discomfort with O'Brien's testing.  Negative belly press.  5/5 strength.  Fingers warm and well-perfused.  IMAGING: I personally ordered and reviewed the following images  X-rays of the right shoulder were obtained in clinic today.  No acute injuries are noted.  Glenohumeral joint space  is maintained.  Minimal AC joint arthritis.  No evidence of proximal humeral migration.  Impression: Negative right shoulder.  New Medications:  No orders of the defined types were placed in this encounter.     Mordecai Rasmussen, Hatfield  07/26/2021 8:29 AM

## 2021-07-26 ENCOUNTER — Encounter: Payer: Self-pay | Admitting: Orthopedic Surgery

## 2021-07-27 ENCOUNTER — Encounter: Payer: Self-pay | Admitting: Emergency Medicine

## 2021-07-31 ENCOUNTER — Other Ambulatory Visit: Payer: Self-pay | Admitting: Family Medicine

## 2021-07-31 DIAGNOSIS — I2510 Atherosclerotic heart disease of native coronary artery without angina pectoris: Secondary | ICD-10-CM

## 2021-08-09 ENCOUNTER — Other Ambulatory Visit: Payer: Self-pay | Admitting: Family Medicine

## 2021-08-09 DIAGNOSIS — Z720 Tobacco use: Secondary | ICD-10-CM

## 2021-08-15 ENCOUNTER — Other Ambulatory Visit: Payer: Self-pay | Admitting: Family Medicine

## 2021-08-15 DIAGNOSIS — Z720 Tobacco use: Secondary | ICD-10-CM

## 2021-08-22 ENCOUNTER — Other Ambulatory Visit: Payer: Self-pay | Admitting: Family Medicine

## 2021-08-22 DIAGNOSIS — I10 Essential (primary) hypertension: Secondary | ICD-10-CM

## 2021-08-22 DIAGNOSIS — I2510 Atherosclerotic heart disease of native coronary artery without angina pectoris: Secondary | ICD-10-CM

## 2021-08-31 ENCOUNTER — Other Ambulatory Visit: Payer: Self-pay | Admitting: Family Medicine

## 2021-08-31 DIAGNOSIS — I10 Essential (primary) hypertension: Secondary | ICD-10-CM

## 2021-09-05 ENCOUNTER — Encounter: Payer: Self-pay | Admitting: Gastroenterology

## 2021-09-07 ENCOUNTER — Encounter: Payer: Self-pay | Admitting: Family Medicine

## 2021-09-07 ENCOUNTER — Ambulatory Visit: Payer: Commercial Managed Care - PPO | Admitting: Family Medicine

## 2021-09-07 VITALS — BP 132/82 | HR 74 | Temp 97.7°F | Ht 72.0 in | Wt 278.8 lb

## 2021-09-07 DIAGNOSIS — K209 Esophagitis, unspecified without bleeding: Secondary | ICD-10-CM

## 2021-09-07 DIAGNOSIS — E1159 Type 2 diabetes mellitus with other circulatory complications: Secondary | ICD-10-CM | POA: Diagnosis not present

## 2021-09-07 DIAGNOSIS — I2510 Atherosclerotic heart disease of native coronary artery without angina pectoris: Secondary | ICD-10-CM

## 2021-09-07 DIAGNOSIS — E1169 Type 2 diabetes mellitus with other specified complication: Secondary | ICD-10-CM | POA: Diagnosis not present

## 2021-09-07 DIAGNOSIS — J029 Acute pharyngitis, unspecified: Secondary | ICD-10-CM | POA: Diagnosis not present

## 2021-09-07 DIAGNOSIS — E785 Hyperlipidemia, unspecified: Secondary | ICD-10-CM

## 2021-09-07 DIAGNOSIS — Z9861 Coronary angioplasty status: Secondary | ICD-10-CM

## 2021-09-07 DIAGNOSIS — I252 Old myocardial infarction: Secondary | ICD-10-CM

## 2021-09-07 DIAGNOSIS — I152 Hypertension secondary to endocrine disorders: Secondary | ICD-10-CM

## 2021-09-07 LAB — CULTURE, GROUP A STREP

## 2021-09-07 LAB — RAPID STREP SCREEN (MED CTR MEBANE ONLY): Strep Gp A Ag, IA W/Reflex: NEGATIVE

## 2021-09-07 MED ORDER — OMEPRAZOLE 20 MG PO CPDR: 20.0000 mg | DELAYED_RELEASE_CAPSULE | Freq: Every day | ORAL | 3 refills | Status: DC

## 2021-09-07 MED ORDER — MAGIC MOUTHWASH W/LIDOCAINE: ORAL | 0 refills | Status: DC

## 2021-09-07 NOTE — Patient Instructions (Signed)
0.'25mg'$  weekly x2 weeks, then increase to 0.'5mg'$  weekly Your prior authorization is being resubmitted. Prilosec sent to pharmacy Suspect throat soreness is from acid erosion of the throat. Increased mucus production is a TYPICAL response to chemical irritation (including cigarettes, sir!) No masses appreciated on exam to suggest throat cancer but you are HIGH risk for this Magic mouthwash with lidocaine sent to use over the next few days to relieve throat pain.  Esophagitis  Esophagitis is inflammation of the esophagus. The esophagus is the tube that carries food from the mouth to the stomach. Esophagitis can cause soreness or pain in the esophagus. This condition can make it difficult and painful to swallow. What are the causes? Most causes of esophagitis are not serious. Common causes of this condition include: Gastroesophageal reflux disease (GERD). This is when stomach contents move back up into the esophagus (reflux). Repeated vomiting. An allergic reaction, especially caused by food allergies (eosinophilic esophagitis). Injury to the esophagus by swallowing large pills with or without water, or swallowing certain types of medicines. Swallowing harmful chemicals, such as household cleaning products. Drinking a lot of alcohol. An infection of the esophagus. This most often occurs in people who have a weakened immune system. Radiation or chemotherapy treatment for cancer. Certain diseases such as sarcoidosis, Crohn's disease, and scleroderma. What are the signs or symptoms? Symptoms of this condition include: Difficult or painful swallowing. Pain with swallowing acidic liquids, such as citrus juices. You may also have pain when you burp. Chest pain and difficulty breathing. Nausea and vomiting. Pain in the abdomen. Weight loss. Ulcers in the mouth and white patches in the mouth (candidiasis). Fever. Coughing up blood or vomiting blood. Stool that is black, tarry, or bright red. How  is this diagnosed? This condition may be diagnosed based on your medical history and a physical exam. You may also have other tests, including: A test to examine your esophagus and stomach with a small flexible tube with a camera (endoscopy). A test that measures the acidity level in your esophagus. A test that measures how much pressure is on your esophagus. A barium swallow or modified barium swallow to show the shape, size, and functioning of your esophagus. Allergy tests. How is this treated? Treatment for this condition depends on the cause of your esophagitis. In some cases, steroids or other medicines may be given to help relieve your symptoms or to treat the underlying cause of your condition. You may have to make some lifestyle changes, such as: Avoiding alcohol. Quitting any products that contain nicotine or tobacco. These products include cigarettes, chewing tobacco, and vaping devices, such as e-cigarettes. If you need help quitting, ask your health care provider. Changing your diet. Exercising. Changing your sleep habits and your sleep environment. Follow these instructions at home: Medicines Take over-the-counter and prescription medicines only as told by your health care provider. Do not take aspirin, ibuprofen, or other NSAIDs unless your health care provider told you to do so. If you have trouble taking pills: Use a pill splitter to decrease the size of the pill. This will decrease the chance of the pill getting stuck or injuring your esophagus. Drink water after you take a pill. Eating and drinking  Avoid foods and drinks that seem to make your symptoms worse. Follow a diet as recommended by your health care provider. This may involve avoiding foods and drinks such as: Coffee and tea, with or without caffeine. Drinks that contain alcohol. Energy drinks and sports drinks. Carbonated drinks or  sodas. Chocolate and cocoa. Peppermint and mint flavorings. Garlic and  onions. Horseradish. Spicy and acidic foods, including peppers, chili powder, curry powder, vinegar, hot sauces, and barbecue sauce. Citrus fruit juices and citrus fruits, such as oranges, lemons, and limes. Tomato-based foods, such as red sauce, chili, salsa, and pizza with red sauce. Fried and fatty foods, such as donuts, french fries, potato chips, and high-fat dressings. High-fat meats, such as hot dogs and fatty cuts of red and white meats, such as rib eye steak, sausage, ham, and bacon. High-fat dairy items, such as whole milk, butter, and cream cheese. Lifestyle Eat small, frequent meals instead of large meals. Avoid drinking large amounts of liquid with your meals. Avoid eating meals during the 2-3 hours before bedtime. Avoid lying down right after you eat. Do not exercise right after you eat. Do not use any products that contain nicotine or tobacco. These products include cigarettes, chewing tobacco, and vaping devices, such as e-cigarettes. If you need help quitting, ask your health care provider. General instructions  Pay attention to any changes in your symptoms. Let your health care provider know about them. Wear loose-fitting clothing. Do not wear anything tight around your waist that causes pressure on your abdomen. Raise (elevate) the head of your bed about 6 inches (15 cm). You may need to use a wedge to do this. Try relaxation strategies such as yoga, deep breathing, or meditation to manage stress. If you need help reducing stress, ask your health care provider. If you are overweight, reduce your weight to an amount that is healthy for you. Ask your health care provider for guidance about a safe weight loss goal. Keep all follow-up visits. This is important. Contact a health care provider if: You have new symptoms. You have unexplained weight loss. You have difficulty swallowing, or it hurts to swallow. You have wheezing or a cough that does not go away. Your symptoms do  not improve with treatment. You have frequent heartburn for more than two weeks. Get help right away if: You have sudden severe pain in your arms, neck, jaw, teeth, or back. You suddenly feel sweaty, dizzy, or light-headed. You have chest pain or shortness of breath. You vomit and the vomit is green, yellow, or black, or it looks like blood or coffee grounds. Your stool is red, bloody, or black. You have a fever. You cannot swallow, drink, or eat. These symptoms may represent a serious problem that is an emergency. Do not wait to see if the symptoms will go away. Get medical help right away. Call your local emergency services (911 in the [place] Do not drive yourself to the hospital. Summary Esophagitis is inflammation of the esophagus. Most causes of esophagitis are not serious. Follow your health care provider's instructions about eating and drinking. Contact a health care provider if you have new symptoms, have weight loss, or coughing that does not stop. Get help right away if you have severe pain in the arms, neck, jaw, teeth, or back, or if you have chest pain, shortness of breath, or fever. This information is not intended to replace advice given to you by your health care provider. Make sure you discuss any questions you have with your health care provider. Document Revised: 08/11/2019 Document Reviewed: 08/11/2019 Elsevier Patient Education  [place].

## 2021-09-07 NOTE — Progress Notes (Signed)
Subjective: CC: Sore throat PCP: Janora Norlander, DO [id number] [name] is a 61 y.o. male presenting to clinic today for:  1.  Sore throat Patient presents today for sore throat.  He notes that this is been ongoing for the last several weeks.  He reports a globus sensation, increased mucus production, particularly in the morning.  On Monday he started some Prilosec and so far this does seem to be helping slightly but he still has some raw feeling along the tongue and throat.  He tried gargling with salt water but he was not able to gargle secondary to the discomfort in the throat.  No known sick contacts.  No fevers, chills, rashes or headaches reported.  No nausea or vomiting reported.  He is an active every day smoker and worries that he may be developing some type of throat cancer  2.  Type 2 diabetes with hypertension, hyperlipidemia and CAD status post stent placement Patient was started on metformin and Ozempic about a month and a half ago.  He notes that he had to discontinue the metformin because of GI side effects.  He was not able to tolerate even at low-dose once daily.  He was however able to tolerate the Ozempic and denies any abdominal pain, nausea or vomiting with that medication.  He had gotten up to the 0.5 mg 3 weeks ago before having run out of medicine.  Apparently we had submitted a prior authorization but they denied the medication because he had not tried and failed metformin.   ROS: Per HPI  No Known Allergies Past Medical History:  Diagnosis Date   Allergy    occasionally    CAD in native artery    a. 2008 - Single-vessel CAD with stent placement to the circumflex  b. 2012 - DES to Diag.  c. 06/2014 - RCA 25% stenosis, LAD 30% stenosis, ramus intermediate 45% stenosis, D1 95% stenosis treated with DES  d.01/2015 - nonobstructive CAD   GERD (gastroesophageal reflux disease)    Hyperlipidemia, mixed    Hypertension    Myocardial infarction ([place]) 2008    Overweight(278.02)    Sleep apnea    CPAP    Current Outpatient Medications:    amLODipine (NORVASC) 10 MG tablet, Take 1 tablet by mouth once daily, Disp: 90 tablet, Rfl: 1   aspirin 81 MG chewable tablet, Chew 81 mg by mouth daily., Disp: , Rfl:    atorvastatin (LIPITOR) 40 MG tablet, TAKE 1 TABLET BY MOUTH IN THE EVENING AT  6  PM, Disp: 90 tablet, Rfl: 1   baclofen (LIORESAL) 10 MG tablet, Take 1 tablet (10 mg total) by mouth 3 (three) times daily., Disp: 30 each, Rfl: 0   Budeson-Glycopyrrol-Formoterol (BREZTRI AEROSPHERE) 160-9-4.8 MCG/ACT AERO, Inhale 2 puffs into the lungs 2 (two) times daily., Disp: 10.7 g, Rfl: 11   buPROPion (WELLBUTRIN XL) 150 MG 24 hr tablet, TAKE 1 TABLET BY MOUTH ONCE DAILY IN THE MORNING, Disp: 90 tablet, Rfl: 1   fluticasone (FLONASE) 50 MCG/ACT nasal spray, Place 2 sprays into both nostrils daily., Disp: 16 g, Rfl: 6   hydrochlorothiazide (HYDRODIURIL) 25 MG tablet, Take 1 tablet by mouth once daily, Disp: 90 tablet, Rfl: 0   linaclotide (LINZESS) 72 MCG capsule, Take 1 capsule (72 mcg total) by mouth daily before breakfast., Disp: 12 capsule, Rfl: 0   lisinopril (ZESTRIL) 40 MG tablet, Take 1 tablet by mouth once daily, Disp: 90 tablet, Rfl: 1   metFORMIN (GLUCOPHAGE) 500  MG tablet, Take 1 tablet (500 mg total) by mouth daily with breakfast., Disp: 90 tablet, Rfl: 0   metoprolol tartrate (LOPRESSOR) 50 MG tablet, Take 1 tablet (50 mg total) by mouth 2 (two) times daily., Disp: 180 tablet, Rfl: 3   nitroGLYCERIN (NITROSTAT) 0.4 MG SL tablet, Place 1 tablet (0.4 mg total) under the tongue every 5 (five) minutes x 3 doses as needed for chest pain., Disp: 25 tablet, Rfl: 1   Semaglutide,0.25 or 0.'5MG'$ /DOS, (OZEMPIC, 0.25 OR 0.5 MG/DOSE,) 2 MG/3ML SOPN, Inject 0.5 mg into the skin every 7 (seven) days., Disp: 9 mL, Rfl: 3 Social History   Socioeconomic History   Marital status: Married    Spouse name: Not on file   Number of children: Not on file   Years of  education: Not on file   Highest education level: Not on file  Occupational History   Occupation: Full time    Employer: [employer]  Tobacco Use   Smoking status: Former    Types: Cigarettes    Start date: 03/20/2012    Quit date: 01/30/2017    Years since quitting: 4.6   Smokeless tobacco: Never  Vaping Use   Vaping Use: Never used  Substance and Sexual Activity   Alcohol use: Yes    Alcohol/week: 0.0 standard drinks of alcohol    Comment: occ   Drug use: No   Sexual activity: Not on file  Other Topics Concern   Not on file  Social History Narrative   Married   No regular exercise   Social Determinants of Health   Financial Resource Strain: Not on file  Food Insecurity: Not on file  Transportation Needs: Not on file  Physical Activity: Not on file  Stress: Not on file  Social Connections: Not on file  Intimate Partner Violence: Not on file   Family History  Problem Relation Age of Onset   Heart disease Mother    Colon polyps Father    Coronary artery disease Other    Colon polyps Sister    Colon cancer Neg Hx    Esophageal cancer Neg Hx    Rectal cancer Neg Hx    Stomach cancer Neg Hx     Objective: Office vital signs reviewed. BP 132/82   Pulse 74   Temp 97.7 F (36.5 C)   Ht 6' (1.829 m)   Wt 278 lb 12.8 oz (126.5 kg)   SpO2 94%   BMI 37.81 kg/m   Physical Examination:  General: Awake, alert, morbidly obese, No acute distress HEENT: Normal    Neck: No masses palpated. No lymphadenopathy    Throat: moist mucus membranes, moderate oropharyngeal erythema, no tonsillar exudate.  Airway is patent Cardio: regular rate and rhythm, S1S2 heard, no murmurs appreciated Pulm: clear to auscultation bilaterally, no wheezes, rhonchi or rales; normal work of breathing on room air GI: Protuberant, soft, nontender.  No palpable masses  Assessment/ Plan: 61 y.o. male   Esophagitis - Plan: omeprazole (PRILOSEC) 20 MG capsule  Sore throat - Plan: Rapid  Strep Screen ([facility] ONLY), Rapid Strep Screen ([facility] ONLY), magic mouthwash w/lidocaine SOLN  Type 2 diabetes mellitus with other specified complication, without long-term current use of insulin ([place])  Hypertension associated with type 2 diabetes mellitus ([place])  Hyperlipidemia associated with type 2 diabetes mellitus ([place])  Morbid obesity (HCC)  CAD S/P percutaneous coronary angioplasty  History of myocardial infarction  Suspect esophagitis based on examination and history.  Responding some  to Prilosec so would like him to continue this for at least the next 4 weeks.  I am going to give him some Magic mouthwash in the interim to help with some of the discomfort he is experiencing.  Strep was negative.  This patient unfortunately did not tolerate metformin due to GI side effects.  He did tolerate the sample of Ozempic and given his significant cardiovascular disease with history of stent placement etc. I do think that he is a good candidate for GLP, Ozempic specifically given its data from a cardiovascular standpoint.  For this reason I will resubmit the prior authorization efforts to obtain this medication for this type II diabetic with advanced heart disease, hypertension and hyperlipidemia.  I again stressed need for smoking cessation with this patient today.  A sample of the Ozempic was provided whilst we attempt to resubmit the prior authorization   No orders of the defined types were placed in this encounter.  No orders of the defined types were placed in this encounter.    Janora Norlander, DO [place] [phone]

## 2021-09-08 ENCOUNTER — Telehealth: Payer: Self-pay

## 2021-09-08 NOTE — Telephone Encounter (Signed)
  Resubmitted to see if they will approve

## 2021-09-08 NOTE — Telephone Encounter (Signed)
[facility] has declined oxempic for patient- can you help?

## 2021-09-14 NOTE — Telephone Encounter (Signed)
It was 7.0 04/19/2019.  He's been a diet controlled diabetic

## 2021-09-14 NOTE — Telephone Encounter (Signed)
PA denied based on A1c of 6.4--insurance has flagged him as not having Type 2 diabetes  Appeal prepared and faxed to :   I will personally continue to follow appeal

## 2021-10-06 NOTE — Telephone Encounter (Signed)
Ozempic appeal denied despite appropriate diagnosis and paperwork Insurance stated the best option would be to send Ozempic RX again in 30 days and RE-file PA  Patient notified and additional Ozempic 0.'5mg'$  sq weekly sample given Patient verbalized understanding.  He is compliant with medication and tolerating well.  Will send to PCP to notify/FYI PharmD to follow

## 2021-10-11 NOTE — Telephone Encounter (Signed)
Thanks I set up a reminder for myself to reorder in 1 m.

## 2021-10-13 ENCOUNTER — Ambulatory Visit: Payer: Commercial Managed Care - PPO | Admitting: Pharmacist

## 2021-11-08 ENCOUNTER — Ambulatory Visit (INDEPENDENT_AMBULATORY_CARE_PROVIDER_SITE_OTHER): Payer: Commercial Managed Care - PPO | Admitting: Pharmacist

## 2021-11-08 DIAGNOSIS — E119 Type 2 diabetes mellitus without complications: Secondary | ICD-10-CM | POA: Diagnosis not present

## 2021-11-08 DIAGNOSIS — E1169 Type 2 diabetes mellitus with other specified complication: Secondary | ICD-10-CM | POA: Diagnosis not present

## 2021-11-08 DIAGNOSIS — I152 Hypertension secondary to endocrine disorders: Secondary | ICD-10-CM

## 2021-11-08 DIAGNOSIS — E1159 Type 2 diabetes mellitus with other circulatory complications: Secondary | ICD-10-CM | POA: Diagnosis not present

## 2021-11-08 DIAGNOSIS — Z9861 Coronary angioplasty status: Secondary | ICD-10-CM

## 2021-11-08 DIAGNOSIS — E785 Hyperlipidemia, unspecified: Secondary | ICD-10-CM

## 2021-11-08 DIAGNOSIS — I2510 Atherosclerotic heart disease of native coronary artery without angina pectoris: Secondary | ICD-10-CM | POA: Diagnosis not present

## 2021-11-08 MED ORDER — OZEMPIC (0.25 OR 0.5 MG/DOSE) 2 MG/3ML ~~LOC~~ SOPN: 0.5000 mg | PEN_INJECTOR | SUBCUTANEOUS | 3 refills | Status: DC

## 2021-11-08 NOTE — Progress Notes (Signed)
    11/08/2021 Name: Scott Hatfield MRN: [id number] DOB: 1960-07-29   S:  83 yoM Presents for diabetes evaluation, education, and management.  We are working with patient to get Ozempic covered for his type 2 diabetes.  He would vastly benefit from continuation of Ozempic samples given T2DM and cardiac history.  He is unable to tolerate metformin.  Insurance coverage/medication affordability: [employer]  Current diabetes medications include: metformin, ozempic samples     O:  Lab Results  Component Value Date   HGBA1C 6.4 (H) 06/08/2021   Lipid Panel     Component Value Date/Time   CHOL 160 07/20/2021 0847   TRIG 99 07/20/2021 0847   HDL 41 07/20/2021 0847   CHOLHDL 3.9 07/20/2021 0847   CHOLHDL 4.3 01/26/2015 0621   VLDL 26 01/26/2015 0621   LDLCALC 101 (H) 07/20/2021 0847   Home fasting blood sugars: <130  2 hour post-meal/random blood sugars: n/a.   Clinical Atherosclerotic Cardiovascular Disease (ASCVD): Yes   The ASCVD Risk score (Arnett DK, et al., 2019) failed to calculate for the following reasons:   The patient has a prior MI or stroke diagnosis   A/P:  Diabetes T2DM--PA resubmitted on website below (unable to use cover me meds) -unable to tolerate metformin -Attempting to start ozempic pending insurance (patient tolerated well; Denies personal and family history of Medullary thyroid cancer (MTC)) -continue ozempic samples for now--0.'5mg'$  weekly  [email or web address]       Regina Eck, PharmD, BCPS Clinical Pharmacist, [place]  II Phone [phone]

## 2021-11-16 ENCOUNTER — Other Ambulatory Visit: Payer: Self-pay | Admitting: Family Medicine

## 2021-11-16 DIAGNOSIS — I2510 Atherosclerotic heart disease of native coronary artery without angina pectoris: Secondary | ICD-10-CM

## 2021-11-22 ENCOUNTER — Other Ambulatory Visit: Payer: Self-pay | Admitting: Family Medicine

## 2021-11-22 DIAGNOSIS — I10 Essential (primary) hypertension: Secondary | ICD-10-CM

## 2022-01-08 ENCOUNTER — Other Ambulatory Visit: Payer: Self-pay | Admitting: Family Medicine

## 2022-01-08 DIAGNOSIS — K209 Esophagitis, unspecified without bleeding: Secondary | ICD-10-CM

## 2022-01-16 ENCOUNTER — Encounter (INDEPENDENT_AMBULATORY_CARE_PROVIDER_SITE_OTHER): Payer: Commercial Managed Care - PPO | Admitting: Family Medicine

## 2022-01-16 DIAGNOSIS — B9689 Other specified bacterial agents as the cause of diseases classified elsewhere: Secondary | ICD-10-CM | POA: Diagnosis not present

## 2022-01-16 DIAGNOSIS — J208 Acute bronchitis due to other specified organisms: Secondary | ICD-10-CM | POA: Diagnosis not present

## 2022-01-16 DIAGNOSIS — Z720 Tobacco use: Secondary | ICD-10-CM

## 2022-01-16 MED ORDER — DOXYCYCLINE HYCLATE 100 MG PO TABS: 100.0000 mg | ORAL_TABLET | Freq: Two times a day (BID) | ORAL | 0 refills | Status: AC

## 2022-01-16 MED ORDER — BENZONATATE 100 MG PO CAPS: 100.0000 mg | ORAL_CAPSULE | Freq: Three times a day (TID) | ORAL | 0 refills | Status: DC | PRN

## 2022-01-16 MED ORDER — PREDNISONE 20 MG PO TABS: ORAL_TABLET | ORAL | 0 refills | Status: DC

## 2022-01-16 NOTE — Telephone Encounter (Signed)

## 2022-01-23 ENCOUNTER — Ambulatory Visit: Payer: Commercial Managed Care - PPO | Admitting: Family Medicine

## 2022-01-23 ENCOUNTER — Other Ambulatory Visit: Payer: Self-pay | Admitting: Family Medicine

## 2022-01-23 ENCOUNTER — Ambulatory Visit (INDEPENDENT_AMBULATORY_CARE_PROVIDER_SITE_OTHER): Payer: Commercial Managed Care - PPO

## 2022-01-23 ENCOUNTER — Encounter: Payer: Self-pay | Admitting: Family Medicine

## 2022-01-23 ENCOUNTER — Encounter (HOSPITAL_COMMUNITY): Payer: Self-pay

## 2022-01-23 ENCOUNTER — Ambulatory Visit (HOSPITAL_COMMUNITY)
Admission: RE | Admit: 2022-01-23 | Discharge: 2022-01-23 | Disposition: A | Discharge status: Home / Self Care | Payer: Commercial Managed Care - PPO | Source: Ambulatory Visit | Attending: Family Medicine | Admitting: Family Medicine

## 2022-01-23 VITALS — BP 146/82 | HR 79 | Temp 98.5°F | Ht 72.0 in | Wt 285.0 lb

## 2022-01-23 DIAGNOSIS — R590 Localized enlarged lymph nodes: Secondary | ICD-10-CM

## 2022-01-23 DIAGNOSIS — R911 Solitary pulmonary nodule: Secondary | ICD-10-CM | POA: Diagnosis not present

## 2022-01-23 DIAGNOSIS — J441 Chronic obstructive pulmonary disease with (acute) exacerbation: Secondary | ICD-10-CM

## 2022-01-23 DIAGNOSIS — R051 Acute cough: Secondary | ICD-10-CM

## 2022-01-23 DIAGNOSIS — E278 Other specified disorders of adrenal gland: Secondary | ICD-10-CM

## 2022-01-23 DIAGNOSIS — R0602 Shortness of breath: Secondary | ICD-10-CM

## 2022-01-23 DIAGNOSIS — F17210 Nicotine dependence, cigarettes, uncomplicated: Secondary | ICD-10-CM

## 2022-01-23 DIAGNOSIS — Z716 Tobacco abuse counseling: Secondary | ICD-10-CM | POA: Diagnosis not present

## 2022-01-23 LAB — POCT I-STAT CREATININE: Creatinine, Ser: 1.1 mg/dL (ref 0.61–1.24)

## 2022-01-23 MED ORDER — VARENICLINE TARTRATE (STARTER) 0.5 MG X 11 & 1 MG X 42 PO TBPK: ORAL_TABLET | ORAL | 0 refills | Status: AC

## 2022-01-23 MED ORDER — ALBUTEROL SULFATE HFA 108 (90 BASE) MCG/ACT IN AERS: 2.0000 | INHALATION_SPRAY | Freq: Four times a day (QID) | RESPIRATORY_TRACT | 1 refills | Status: DC | PRN

## 2022-01-23 MED ORDER — AMOXICILLIN-POT CLAVULANATE 875-125 MG PO TABS: 1.0000 | ORAL_TABLET | Freq: Two times a day (BID) | ORAL | 0 refills | Status: DC

## 2022-01-23 MED ORDER — VARENICLINE TARTRATE 1 MG PO TABS: 1.0000 mg | ORAL_TABLET | Freq: Two times a day (BID) | ORAL | 1 refills | Status: DC

## 2022-01-23 MED ORDER — IOHEXOL 300 MG/ML  SOLN
75.0000 mL | Freq: Once | INTRAMUSCULAR | Status: AC | PRN
  Administered 2022-01-23: 75 mL via INTRAVENOUS

## 2022-01-23 MED ORDER — DEXAMETHASONE 2 MG PO TABS: ORAL_TABLET | ORAL | 0 refills | Status: DC

## 2022-01-23 NOTE — Progress Notes (Signed)
BP (!) 146/82   Pulse 79   Temp 98.5 F (36.9 C)   Ht 6' (1.829 m)   Wt 285 lb (129.3 kg)   SpO2 93%   BMI 38.65 kg/m    Subjective:   Patient ID: Scott Hatfield, male    DOB: Feb 10, 1961, 61 y.o.   MRN: [id number]  HPI: Scott Hatfield is a 61 y.o. male presenting on 01/23/2022 for Cough and Shortness of Breath   HPI Shortness of breath and wheezing Patient is coming in for COPD exacerbation and cough and congestion and shortness of breath and wheezing.  He was treated for it a week ago and is still on doxycycline but finished the prednisone and he feels like he has not gotten better.  He does say he has a little bit of ear pressure as well and popping and ringing that is been going on.  That is been newer.  These newer symptoms started about a week ago.  He has had cold and some congestion for over a month but the newer symptom started just a week ago and its worsened or not improved at all with the medicine.  He does use Breztri but does not have an albuterol inhaler.  Relevant past medical, surgical, family and social history reviewed and updated as indicated. Interim medical history since our last visit reviewed. Allergies and medications reviewed and updated.  Review of Systems  Constitutional:  Negative for chills and fever.  HENT:  Positive for congestion and sinus pressure. Negative for ear discharge, ear pain, postnasal drip, rhinorrhea, sneezing, sore throat and voice change.   Eyes:  Negative for pain, discharge, redness and visual disturbance.  Respiratory:  Positive for cough, shortness of breath and wheezing.   Cardiovascular:  Negative for chest pain and leg swelling.  Musculoskeletal:  Negative for back pain and gait problem.  Skin:  Negative for rash.  All other systems reviewed and are negative.   Per HPI unless specifically indicated above   Allergies as of 01/23/2022       Reactions   Metformin And Related Nausea Only        Medication List         Accurate as of January 23, 2022  9:49 AM. If you have any questions, ask your nurse or doctor.          STOP taking these medications    predniSONE 20 MG tablet Commonly known as: DELTASONE Stopped by: Fransisca Kaufmann Codey Burling, MD       TAKE these medications    albuterol 108 (90 Base) MCG/ACT inhaler Commonly known as: VENTOLIN HFA Inhale 2 puffs into the lungs every 6 (six) hours as needed for wheezing or shortness of breath. Started by: Worthy Rancher, MD   amLODipine 10 MG tablet Commonly known as: NORVASC Take 1 tablet by mouth once daily   amoxicillin-clavulanate 875-125 MG tablet Commonly known as: AUGMENTIN Take 1 tablet by mouth 2 (two) times daily. Started by: Worthy Rancher, MD   aspirin 81 MG chewable tablet Chew 81 mg by mouth daily.   atorvastatin 40 MG tablet Commonly known as: LIPITOR TAKE 1 TABLET BY MOUTH ONCE DAILY IN THE EVENING AT 6 PM   baclofen 10 MG tablet Commonly known as: LIORESAL Take 1 tablet (10 mg total) by mouth 3 (three) times daily.   benzonatate 100 MG capsule Commonly known as: Tessalon Perles Take 1 capsule (100 mg total) by mouth 3 (three) times daily as needed.  Breztri Aerosphere 160-9-4.8 MCG/ACT Aero Generic drug: Budeson-Glycopyrrol-Formoterol Inhale 2 puffs into the lungs 2 (two) times daily.   buPROPion 150 MG 24 hr tablet Commonly known as: WELLBUTRIN XL TAKE 1 TABLET BY MOUTH ONCE DAILY IN THE MORNING   dexamethasone 2 MG tablet Commonly known as: DECADRON Take 4 tablets for 3 days then 3 tablets for 3 days then 2 tablets for 3 days then 1 tablet for 3 days Started by: Fransisca Kaufmann Leinani Lisbon, MD   doxycycline 100 MG tablet Commonly known as: VIBRA-TABS Take 1 tablet (100 mg total) by mouth 2 (two) times daily for 7 days.   fluticasone 50 MCG/ACT nasal spray Commonly known as: FLONASE Place 2 sprays into both nostrils daily.   hydrochlorothiazide 25 MG tablet Commonly known as: HYDRODIURIL Take  1 tablet by mouth once daily   linaclotide 72 MCG capsule Commonly known as: Linzess Take 1 capsule (72 mcg total) by mouth daily before breakfast.   lisinopril 40 MG tablet Commonly known as: ZESTRIL Take 1 tablet by mouth once daily   magic mouthwash w/lidocaine Soln Gargle and spit 71m every 6 hours as needed for sore throat. 630mlidocaine, 6011mystatin, '60mg'$  hydrocortisone tab, qs benadryl total 480m23m metFORMIN 500 MG tablet Commonly known as: GLUCOPHAGE Take 1 tablet (500 mg total) by mouth daily with breakfast.   metoprolol tartrate 50 MG tablet Commonly known as: LOPRESSOR Take 1 tablet (50 mg total) by mouth 2 (two) times daily.   nitroGLYCERIN 0.4 MG SL tablet Commonly known as: NITROSTAT Place 1 tablet (0.4 mg total) under the tongue every 5 (five) minutes x 3 doses as needed for chest pain.   omeprazole 20 MG capsule Commonly known as: PRILOSEC Take 1 capsule by mouth once daily   Ozempic (0.25 or 0.5 MG/DOSE) 2 MG/3ML Sopn Generic drug: Semaglutide(0.25 or 0.'5MG'$ /DOS) Inject 0.5 mg into the skin every 7 (seven) days.   Varenicline Tartrate (Starter) 0.5 MG X 11 & 1 MG X 42 Tbpk Commonly known as: Chantix Starting Month Pak Take 0.5 mg by mouth daily for 3 days, THEN 0.5 mg 2 (two) times daily for 4 days, THEN 1 mg 2 (two) times daily for 23 days. Start taking on: January 23, 2022 Started by: JoshFransisca Kaufmanntinger, MD   varenicline 1 MG tablet Commonly known as: Chantix Continuing Month Pak Take 1 tablet (1 mg total) by mouth 2 (two) times daily. Started by: JoshFransisca Kaufmanntinger, MD         Objective:   BP (!) 146/82   Pulse 79   Temp 98.5 F (36.9 C)   Ht 6' (1.829 m)   Wt 285 lb (129.3 kg)   SpO2 93%   BMI 38.65 kg/m   Wt Readings from Last 3 Encounters:  01/23/22 285 lb (129.3 kg)  09/07/21 278 lb 12.8 oz (126.5 kg)  07/20/21 284 lb 3.2 oz (128.9 kg)    Physical Exam Vitals and nursing note reviewed.  Constitutional:      General: He  is not in acute distress.    Appearance: He is well-developed. He is not diaphoretic.  Eyes:     General: No scleral icterus.    Conjunctiva/sclera: Conjunctivae normal.  Neck:     Thyroid: No thyromegaly.  Cardiovascular:     Rate and Rhythm: Normal rate and regular rhythm.     Heart sounds: Normal heart sounds. No murmur heard. Pulmonary:     Effort: Pulmonary effort is normal. No respiratory distress.  Breath sounds: Wheezing and rhonchi present. No rales.  Musculoskeletal:        General: Normal range of motion.     Cervical back: Neck supple.  Lymphadenopathy:     Cervical: No cervical adenopathy.  Skin:    General: Skin is warm and dry.     Findings: No rash.  Neurological:     Mental Status: He is alert and oriented to person, place, and time.     Coordination: Coordination normal.  Psychiatric:        Behavior: Behavior normal.     Chest x-ray: Increased perihilar thickening, await final read from radiology.  Assessment & Plan:   Problem List Items Addressed This Visit       Other   Encounter for smoking cessation counseling   Relevant Medications   Varenicline Tartrate, Starter, (CHANTIX STARTING MONTH PAK) 0.5 MG X 11 & 1 MG X 42 TBPK   varenicline (CHANTIX CONTINUING MONTH PAK) 1 MG tablet   Other Visit Diagnoses     COPD exacerbation ([place])    -  Primary   Relevant Medications   amoxicillin-clavulanate (AUGMENTIN) 875-125 MG tablet   dexamethasone (DECADRON) 2 MG tablet   albuterol (VENTOLIN HFA) 108 (90 Base) MCG/ACT inhaler   Varenicline Tartrate, Starter, (CHANTIX STARTING MONTH PAK) 0.5 MG X 11 & 1 MG X 42 TBPK   varenicline (CHANTIX CONTINUING MONTH PAK) 1 MG tablet   Other Relevant Orders   DG Chest 2 View   SOB (shortness of breath)       Relevant Medications   amoxicillin-clavulanate (AUGMENTIN) 875-125 MG tablet   dexamethasone (DECADRON) 2 MG tablet   albuterol (VENTOLIN HFA) 108 (90 Base) MCG/ACT inhaler   Other Relevant Orders   DG  Chest 2 View   Hilar adenopathy       Relevant Medications   amoxicillin-clavulanate (AUGMENTIN) 875-125 MG tablet   dexamethasone (DECADRON) 2 MG tablet   Smokes less than 1 pack a day with greater than 30 pack year history       Relevant Medications   Varenicline Tartrate, Starter, (CHANTIX STARTING MONTH PAK) 0.5 MG X 11 & 1 MG X 42 TBPK   varenicline (CHANTIX CONTINUING MONTH PAK) 1 MG tablet   Solitary pulmonary nodule       Relevant Orders   CT Chest W Contrast       Sent Chantix from smoking cessation.  Due to abnormal x-ray, will send for CT scan of the chest. Follow up plan: Return if symptoms worsen or fail to improve.  Counseling provided for all of the vaccine components Orders Placed This Encounter  Procedures   DG Chest [place] Alaney Witter, MD [facility] 01/23/2022, 9:49 AM

## 2022-02-10 ENCOUNTER — Other Ambulatory Visit: Payer: Commercial Managed Care - PPO

## 2022-02-10 ENCOUNTER — Ambulatory Visit (INDEPENDENT_AMBULATORY_CARE_PROVIDER_SITE_OTHER): Payer: Commercial Managed Care - PPO

## 2022-02-10 ENCOUNTER — Encounter (INDEPENDENT_AMBULATORY_CARE_PROVIDER_SITE_OTHER): Payer: Commercial Managed Care - PPO | Admitting: Family Medicine

## 2022-02-10 ENCOUNTER — Ambulatory Visit (HOSPITAL_COMMUNITY)
Admission: RE | Admit: 2022-02-10 | Discharge: 2022-02-10 | Disposition: A | Discharge status: Home / Self Care | Payer: Commercial Managed Care - PPO | Source: Ambulatory Visit | Attending: Family Medicine | Admitting: Family Medicine

## 2022-02-10 DIAGNOSIS — J441 Chronic obstructive pulmonary disease with (acute) exacerbation: Secondary | ICD-10-CM

## 2022-02-10 DIAGNOSIS — F17210 Nicotine dependence, cigarettes, uncomplicated: Secondary | ICD-10-CM

## 2022-02-10 DIAGNOSIS — E278 Other specified disorders of adrenal gland: Secondary | ICD-10-CM | POA: Diagnosis present

## 2022-02-10 DIAGNOSIS — Z20828 Contact with and (suspected) exposure to other viral communicable diseases: Secondary | ICD-10-CM

## 2022-02-10 LAB — VERITOR FLU A/B WAIVED
Influenza A: NEGATIVE
Influenza B: NEGATIVE

## 2022-02-10 MED ORDER — PREDNISONE 20 MG PO TABS: ORAL_TABLET | ORAL | 0 refills | Status: DC

## 2022-02-10 MED ORDER — HYDROCOD POLI-CHLORPHE POLI ER 10-8 MG/5ML PO SUER: 5.0000 mL | Freq: Two times a day (BID) | ORAL | 0 refills | Status: DC | PRN

## 2022-02-10 MED ORDER — GADOBUTROL 1 MMOL/ML IV SOLN
10.0000 mL | Freq: Once | INTRAVENOUS | Status: AC | PRN
  Administered 2022-02-10: 10 mL via INTRAVENOUS

## 2022-02-10 NOTE — Telephone Encounter (Signed)
Please see the MyChart message reply(ies) for my assessment and plan.   The Narcotic Database has been reviewed.  There were no red flags.     Orders Placed This Encounter  Procedures   COVID-19, Flu A+B and RSV    Standing Status:   Future    Standing Expiration Date:   02/11/2023    Order Specific Question:   Previously tested for COVID-19    Answer:   Yes    Order Specific Question:   Resident in a congregate (group) care setting    Answer:   No    Order Specific Question:   Is the patient student?    Answer:   No    Order Specific Question:   Employed in healthcare setting    Answer:   No    Order Specific Question:   Has patient completed COVID vaccination(s) (2 doses of Pfizer/Moderna 1 dose of [employer])    Answer:   No   DG Chest 2 View    Standing Status:   Future    Number of Occurrences:   1    Standing Expiration Date:   02/11/2023    Order Specific Question:   Reason for Exam (SYMPTOM  OR DIAGNOSIS REQUIRED)    Answer:   wheezing, cough    Order Specific Question:   Preferred imaging location?    Answer:   Internal   Veritor Flu A/B Waived    Standing Status:   Future    Standing Expiration Date:   02/11/2023    Order Specific Question:   Source    Answer:   nasal   Ambulatory referral to Pulmonology    Referral Priority:   Routine    Referral Type:   Consultation    Referral Reason:   Specialty Services Required    Requested Specialty:   Pulmonary Disease    Number of Visits Requested:   1     This patient gave consent for this Medical Advice Message and is aware that it may result in a bill to [employer], as well as the possibility of receiving a bill for a co-payment or deductible. They are an established patient, but are not seeking medical advice exclusively about a problem treated during an in person or video visit in the last seven days. I did not recommend an in person or video visit within seven days of my reply.    I spent a total  of 21 minutes cumulative time within 7 days through [employer].  Ronnie Doss, DO

## 2022-02-12 ENCOUNTER — Other Ambulatory Visit: Payer: Self-pay | Admitting: Family Medicine

## 2022-02-12 DIAGNOSIS — I10 Essential (primary) hypertension: Secondary | ICD-10-CM

## 2022-02-13 LAB — COVID-19, FLU A+B AND RSV
Influenza A, NAA: NOT DETECTED
Influenza B, NAA: NOT DETECTED
RSV, NAA: NOT DETECTED
SARS-CoV-2, NAA: NOT DETECTED

## 2022-02-15 ENCOUNTER — Encounter: Payer: Self-pay | Admitting: Family Medicine

## 2022-02-15 NOTE — Telephone Encounter (Signed)
Recommend he have an in person evaluation then.

## 2022-02-17 ENCOUNTER — Encounter: Payer: Self-pay | Admitting: Family Medicine

## 2022-02-17 ENCOUNTER — Ambulatory Visit (INDEPENDENT_AMBULATORY_CARE_PROVIDER_SITE_OTHER): Payer: Commercial Managed Care - PPO | Admitting: Family Medicine

## 2022-02-17 VITALS — BP 138/83 | HR 79 | Temp 97.0°F | Ht 72.0 in | Wt 277.6 lb

## 2022-02-17 DIAGNOSIS — I2510 Atherosclerotic heart disease of native coronary artery without angina pectoris: Secondary | ICD-10-CM | POA: Diagnosis not present

## 2022-02-17 DIAGNOSIS — E278 Other specified disorders of adrenal gland: Secondary | ICD-10-CM | POA: Insufficient documentation

## 2022-02-17 DIAGNOSIS — R0602 Shortness of breath: Secondary | ICD-10-CM | POA: Diagnosis not present

## 2022-02-17 DIAGNOSIS — F17210 Nicotine dependence, cigarettes, uncomplicated: Secondary | ICD-10-CM

## 2022-02-17 DIAGNOSIS — K760 Fatty (change of) liver, not elsewhere classified: Secondary | ICD-10-CM | POA: Insufficient documentation

## 2022-02-17 DIAGNOSIS — Z9861 Coronary angioplasty status: Secondary | ICD-10-CM

## 2022-02-17 LAB — CMP14+EGFR
ALT: 31 IU/L (ref 0–44)
AST: 15 IU/L (ref 0–40)
Albumin/Globulin Ratio: 1.4 (ref 1.2–2.2)
Albumin: 3.7 g/dL — ABNORMAL LOW (ref 3.9–4.9)
Alkaline Phosphatase: 85 IU/L (ref 44–121)
BUN/Creatinine Ratio: 13 (ref 10–24)
BUN: 11 mg/dL (ref 8–27)
Bilirubin Total: 0.8 mg/dL (ref 0.0–1.2)
CO2: 24 mmol/L (ref 20–29)
Calcium: 8.6 mg/dL (ref 8.6–10.2)
Chloride: 98 mmol/L (ref 96–106)
Creatinine, Ser: 0.86 mg/dL (ref 0.76–1.27)
Globulin, Total: 2.6 g/dL (ref 1.5–4.5)
Glucose: 232 mg/dL — ABNORMAL HIGH (ref 70–99)
Potassium: 4.3 mmol/L (ref 3.5–5.2)
Sodium: 138 mmol/L (ref 134–144)
Total Protein: 6.3 g/dL (ref 6.0–8.5)
eGFR: 99 mL/min/{1.73_m2} (ref >59)

## 2022-02-17 LAB — CBC
Hematocrit: 48 % (ref 37.5–51.0)
Hemoglobin: 16.2 g/dL (ref 13.0–17.7)
MCH: 31.2 pg (ref 26.6–33.0)
MCHC: 33.8 g/dL (ref 31.5–35.7)
MCV: 93 fL (ref 79–97)
Platelets: 301 10*3/uL (ref 150–450)
RBC: 5.19 x10E6/uL (ref 4.14–5.80)
RDW: 11.4 % — ABNORMAL LOW (ref 11.6–15.4)
WBC: 8.7 10*3/uL (ref 3.4–10.8)

## 2022-02-17 LAB — BRAIN NATRIURETIC PEPTIDE: BNP: 59.1 pg/mL (ref 0.0–100.0)

## 2022-02-17 LAB — TSH: TSH: 1.44 u[IU]/mL (ref 0.450–4.500)

## 2022-02-17 LAB — TROPONIN T: Troponin T (Highly Sensitive): 9 ng/L (ref 0–22)

## 2022-02-17 MED ORDER — ATROVENT HFA 17 MCG/ACT IN AERS: 2.0000 | INHALATION_SPRAY | Freq: Four times a day (QID) | RESPIRATORY_TRACT | 0 refills | Status: DC | PRN

## 2022-02-17 MED ORDER — TRELEGY ELLIPTA 100-62.5-25 MCG/ACT IN AEPB: 1.0000 | INHALATION_SPRAY | Freq: Every day | RESPIRATORY_TRACT | 0 refills | Status: DC

## 2022-02-17 NOTE — Progress Notes (Signed)
Subjective: YY:TKPT PCP: Janora Norlander, DO WSF:KCLEXNT C Kohles is a 62 y.o. male presenting to clinic today for:  1. SOB Started feeling poorly 01/23/2022.  Was treated COPD exacerbation and was seen virtually again 02/10/2022 with another round of steroid.  He presents today for ongoing dyspnea with exertion.  He states congestion in chest slightly better over last 2 days. No longer taking Mucinex.  Compliant with Breztri BID and Albuterol up to twice daily since he has been ill.  He is an active, every day cigarette smoker but has been OFF cigarettes since Wednesday.  He knows "he needs to make changes".  Reports some chest twinges but "nothing that scared him" or felt like previous MI.  Has OV with Dr Melvyn Novas 03/23/2022.   CT negative 01/2022 CXR negative 01/2022 MRI abd with BENIGN adrenal adenoma AND Hepatic steatosis.   ROS: Per HPI  Allergies  Allergen Reactions   Metformin And Related Nausea Only   Past Medical History:  Diagnosis Date   Allergy    occasionally    CAD in native artery    a. 2008 - Single-vessel CAD with stent placement to the circumflex  b. 2012 - DES to Diag.  c. 06/2014 - RCA 25% stenosis, LAD 30% stenosis, ramus intermediate 45% stenosis, D1 95% stenosis treated with DES  d.01/2015 - nonobstructive CAD   GERD (gastroesophageal reflux disease)    Hyperlipidemia, mixed    Hypertension    Myocardial infarction ([place]) 2008   Overweight(278.02)    Sleep apnea    CPAP    Current Outpatient Medications:    albuterol (VENTOLIN HFA) 108 (90 Base) MCG/ACT inhaler, Inhale 2 puffs into the lungs every 6 (six) hours as needed for wheezing or shortness of breath., Disp: 8 g, Rfl: 1   amLODipine (NORVASC) 10 MG tablet, Take 1 tablet by mouth once daily, Disp: 90 tablet, Rfl: 1   amoxicillin-clavulanate (AUGMENTIN) 875-125 MG tablet, Take 1 tablet by mouth 2 (two) times daily., Disp: 20 tablet, Rfl: 0   aspirin 81 MG chewable tablet, Chew 81 mg by mouth daily.,  Disp: , Rfl:    atorvastatin (LIPITOR) 40 MG tablet, TAKE 1 TABLET BY MOUTH ONCE DAILY IN THE EVENING AT 6 PM, Disp: 90 tablet, Rfl: 0   baclofen (LIORESAL) 10 MG tablet, Take 1 tablet (10 mg total) by mouth 3 (three) times daily., Disp: 30 each, Rfl: 0   benzonatate (TESSALON PERLES) 100 MG capsule, Take 1 capsule (100 mg total) by mouth 3 (three) times daily as needed., Disp: 20 capsule, Rfl: 0   Budeson-Glycopyrrol-Formoterol (BREZTRI AEROSPHERE) 160-9-4.8 MCG/ACT AERO, Inhale 2 puffs into the lungs 2 (two) times daily., Disp: 10.7 g, Rfl: 11   buPROPion (WELLBUTRIN XL) 150 MG 24 hr tablet, TAKE 1 TABLET BY MOUTH ONCE DAILY IN THE MORNING, Disp: 90 tablet, Rfl: 1   chlorpheniramine-HYDROcodone (TUSSIONEX) 10-8 MG/5ML, Take 5 mLs by mouth every 12 (twelve) hours as needed for cough., Disp: 120 mL, Rfl: 0   fluticasone (FLONASE) 50 MCG/ACT nasal spray, Place 2 sprays into both nostrils daily., Disp: 16 g, Rfl: 6   hydrochlorothiazide (HYDRODIURIL) 25 MG tablet, Take 1 tablet by mouth once daily, Disp: 90 tablet, Rfl: 0   linaclotide (LINZESS) 72 MCG capsule, Take 1 capsule (72 mcg total) by mouth daily before breakfast., Disp: 12 capsule, Rfl: 0   lisinopril (ZESTRIL) 40 MG tablet, Take 1 tablet (40 mg total) by mouth daily. (NEEDS TO BE SEEN BEFORE NEXT REFILL), Disp: 30  tablet, Rfl: 0   magic mouthwash w/lidocaine SOLN, Gargle and spit 35m every 6 hours as needed for sore throat. 65mlidocaine, 6041mystatin, '60mg'$  hydrocortisone tab, qs benadryl total 480m87mDisp: 480 mL, Rfl: 0   metFORMIN (GLUCOPHAGE) 500 MG tablet, Take 1 tablet (500 mg total) by mouth daily with breakfast., Disp: 90 tablet, Rfl: 0   metoprolol tartrate (LOPRESSOR) 50 MG tablet, Take 1 tablet (50 mg total) by mouth 2 (two) times daily., Disp: 180 tablet, Rfl: 3   nitroGLYCERIN (NITROSTAT) 0.4 MG SL tablet, Place 1 tablet (0.4 mg total) under the tongue every 5 (five) minutes x 3 doses as needed for chest pain., Disp: 25  tablet, Rfl: 1   omeprazole (PRILOSEC) 20 MG capsule, Take 1 capsule by mouth once daily, Disp: 30 capsule, Rfl: 1   predniSONE (DELTASONE) 20 MG tablet, 2 po at same time daily for 5 days, Disp: 10 tablet, Rfl: 0   Semaglutide,0.25 or 0.'5MG'$ /DOS, (OZEMPIC, 0.25 OR 0.5 MG/DOSE,) 2 MG/3ML SOPN, Inject 0.5 mg into the skin every 7 (seven) days., Disp: 9 mL, Rfl: 3   varenicline (CHANTIX CONTINUING MONTH PAK) 1 MG tablet, Take 1 tablet (1 mg total) by mouth 2 (two) times daily., Disp: 60 tablet, Rfl: 1   Varenicline Tartrate, Starter, (CHANTIX STARTING MONTH PAK) 0.5 MG X 11 & 1 MG X 42 TBPK, Take 0.5 mg by mouth daily for 3 days, THEN 0.5 mg 2 (two) times daily for 4 days, THEN 1 mg 2 (two) times daily for 23 days., Disp: 42 e63h, Rfl: 0 Social History   Socioeconomic History   Marital status: Married    Spouse name: Not on file   Number of children: Not on file   Years of education: Not on file   Highest education level: Not on file  Occupational History   Occupation: Full time    Employer: [employer]  Tobacco Use   Smoking status: Former    Types: Cigarettes    Start date: 03/20/2012    Quit date: 01/30/2017    Years since quitting: 5.0   Smokeless tobacco: Never  Vaping Use   Vaping Use: Never used  Substance and Sexual Activity   Alcohol use: Yes    Alcohol/week: 0.0 standard drinks of alcohol    Comment: occ   Drug use: No   Sexual activity: Not on file  Other Topics Concern   Not on file  Social History Narrative   Married   No regular exercise   Social Determinants of Health   Financial Resource Strain: Not on file  Food Insecurity: Not on file  Transportation Needs: Not on file  Physical Activity: Not on file  Stress: Not on file  Social Connections: Not on file  Intimate Partner Violence: Not on file   Family History  Problem Relation Age of Onset   Heart disease Mother    Colon polyps Father    Coronary artery disease Other    Colon polyps Sister     Colon cancer Neg Hx    Esophageal cancer Neg Hx    Rectal cancer Neg Hx    Stomach cancer Neg Hx     Objective: Office vital signs reviewed. BP 138/83   Pulse 79   Temp (!) 97 F (36.1 C) (Oral)   Ht 6' (1.829 m)   Wt 277 lb 9.6 oz (125.9 kg)   SpO2 91% Comment: walking  BMI 37.65 kg/m   Physical Examination:  General: Awake, alert, morbidly obese,  No acute distress HEENT: sclera white, MMM Cardio: regular rate and rhythm, S1S2 heard, no murmurs appreciated Pulm: Mild expiratory wheeze at bases with fair air movement. Post neb: increased bibasilar rhonchi with good air movement.   Assessment/ Plan: 62 y.o. male   SOB (shortness of breath) - Plan: EKG 12-Lead, Brain natriuretic peptide, CMP14+EGFR, TSH, CBC, Stat Troponin T, ipratropium (ATROVENT HFA) 17 MCG/ACT inhaler, Fluticasone-Umeclidin-Vilant (TRELEGY ELLIPTA) 100-62.5-25 MCG/ACT AEPB  Smokes less than 1 pack a day with greater than 30 pack year history - Plan: CMP14+EGFR, TSH, CBC, Fluticasone-Umeclidin-Vilant (TRELEGY ELLIPTA) 100-62.5-25 MCG/ACT AEPB  Benign mass of left adrenal gland (HCC)  CAD- S/P PCI '08, 2012, Dx DES 07/03/14 - Plan: EKG 12-Lead, Brain natriuretic peptide, CMP14+EGFR, TSH, CBC, Stat Troponin T  Fatty liver  O2 reassuring.  Maintains above 90% with ambulation.  Personal review of EKG within normal range with no evidence of acute ischemia.  Since SOB a cardiac equivalent, I proceeded with heart enzymes.  He was given a duoneb in office, which did seem to help so Atrovent sent to pharmacy.  Use q6 with albuterol x48 hours.  Then only if needed  Breztri REPLACED with Trelegy.  Uncertain if ongoing symptoms due to uncontrolled COPD but I suspect a combination of obesity/ physical deconditioning/ underlying cardiac/ pulmonary disease.  Discussed benign adenoma and FATTY liver on MRI.  Pt to see me back in 2 months for DM/ lipid/ weight loss follow up  No orders of the defined types were placed in  this encounter.  No orders of the defined types were placed in this encounter.    Janora Norlander, DO [place] [phone]

## 2022-02-17 NOTE — Patient Instructions (Addendum)
STOP Breztri Use albuterol and atrovent every 6 hours JUST for the next 48 hours. Then ONLY use albuterol IF needed and START Trelegy (1 puff ONCE per day)   Fatty Liver Disease  The liver converts food into energy, removes toxic material from the blood, makes important proteins, and absorbs necessary vitamins from food. Fatty liver disease occurs when too much fat has built up in your liver cells. Fatty liver disease is also called hepatic steatosis. In many cases, fatty liver disease does not cause symptoms or problems. It is often diagnosed when tests are being done for other reasons. However, over time, fatty liver can cause inflammation that may lead to more serious liver problems, such as scarring of the liver (cirrhosis) and liver failure. Fatty liver is associated with insulin resistance, increased body fat, high blood pressure (hypertension), and high cholesterol. These are features of metabolic syndrome and increase your risk for stroke, diabetes, and heart disease. What are the causes? This condition may be caused by components of metabolic syndrome: Obesity. Insulin resistance. High cholesterol. Other causes: Alcohol abuse. Poor nutrition. Cushing syndrome. Pregnancy. Certain drugs. Poisons. Some viral infections. What increases the risk? You are more likely to develop this condition if you: Abuse alcohol. Are overweight. Have diabetes. Have hepatitis. Have a high triglyceride level. Are pregnant. What are the signs or symptoms? Fatty liver disease often does not cause symptoms. If symptoms do develop, they can include: Fatigue and weakness. Weight loss. Confusion. Nausea, vomiting, or abdominal pain. Yellowing of your skin and the white parts of your eyes (jaundice). Itchy skin. How is this diagnosed? This condition may be diagnosed by: A physical exam and your medical history. Blood tests. Imaging tests, such as an ultrasound, CT scan, or MRI. A liver biopsy.  A small sample of liver tissue is removed using a needle. The sample is then looked at under a microscope. How is this treated? Fatty liver disease is often caused by other health conditions. Treatment for fatty liver may involve medicines and lifestyle changes to manage conditions such as: Alcoholism. High cholesterol. Diabetes. Being overweight or obese. Follow these instructions at home:  Do not drink alcohol. If you have trouble quitting, ask your health care provider how to safely quit with the help of medicine or a supervised program. This is important to keep your condition from getting worse. Eat a healthy diet as told by your health care provider. Ask your health care provider about working with a dietitian to develop an eating plan. Exercise regularly. This can help you lose weight and control your cholesterol and diabetes. Talk to your health care provider about an exercise plan and which activities are best for you. Take over-the-counter and prescription medicines only as told by your health care provider. Keep all follow-up visits. This is important. Contact a health care provider if: You have trouble controlling your: Blood sugar. This is especially important if you have diabetes. Cholesterol. Drinking of alcohol. Get help right away if: You have abdominal pain. You have jaundice. You have nausea and are vomiting. You vomit blood or material that looks like coffee grounds. You have stools that are black, tar-like, or bloody. Summary Fatty liver disease develops when too much fat builds up in the cells of your liver. Fatty liver disease often causes no symptoms or problems. However, over time, fatty liver can cause inflammation that may lead to more serious liver problems, such as scarring of the liver (cirrhosis). You are more likely to develop this condition  if you abuse alcohol, are pregnant, are overweight, have diabetes, have hepatitis, or have high triglyceride or  cholesterol levels. Contact your health care provider if you have trouble controlling your blood sugar, cholesterol, or drinking of alcohol. This information is not intended to replace advice given to you by your health care provider. Make sure you discuss any questions you have with your health care provider. Document Revised: 11/13/2019 Document Reviewed: 11/13/2019 Elsevier Patient Education  [place].

## 2022-02-20 ENCOUNTER — Encounter: Payer: Self-pay | Admitting: *Deleted

## 2022-02-20 ENCOUNTER — Other Ambulatory Visit: Payer: Self-pay | Admitting: Family Medicine

## 2022-02-20 DIAGNOSIS — I2510 Atherosclerotic heart disease of native coronary artery without angina pectoris: Secondary | ICD-10-CM

## 2022-02-20 DIAGNOSIS — I10 Essential (primary) hypertension: Secondary | ICD-10-CM

## 2022-02-20 NOTE — Progress Notes (Signed)
Received referral for initial lung cancer screening scan. Contacted patient and obtained smoking history (started age 62, current smoker smoking 1/4PPD who previously smoked 1PPD, 44 pack year) as well as answering questions related to the screening process. Patient denies signs/symptoms of lung cancer such as weight loss or hemoptysis. Patient denies comorbidity that would prevent curative treatment if lung cancer were to be found. Patient had a CT Chest with contrast on 01/23/22 which showed no signs of cancer. Patient to be scheduled for SDMV/LDCT in December 2024. Patient aware and agreeable to this plan.

## 2022-02-26 ENCOUNTER — Other Ambulatory Visit: Payer: Self-pay | Admitting: Family Medicine

## 2022-02-26 DIAGNOSIS — I10 Essential (primary) hypertension: Secondary | ICD-10-CM

## 2022-02-26 DIAGNOSIS — Z720 Tobacco use: Secondary | ICD-10-CM

## 2022-03-03 ENCOUNTER — Telehealth: Payer: Self-pay | Admitting: Pharmacist

## 2022-03-03 DIAGNOSIS — I152 Hypertension secondary to endocrine disorders: Secondary | ICD-10-CM

## 2022-03-03 DIAGNOSIS — E119 Type 2 diabetes mellitus without complications: Secondary | ICD-10-CM

## 2022-03-03 DIAGNOSIS — I2510 Atherosclerotic heart disease of native coronary artery without angina pectoris: Secondary | ICD-10-CM

## 2022-03-03 DIAGNOSIS — E1169 Type 2 diabetes mellitus with other specified complication: Secondary | ICD-10-CM

## 2022-03-03 MED ORDER — OZEMPIC (0.25 OR 0.5 MG/DOSE) 2 MG/3ML ~~LOC~~ SOPN: 0.5000 mg | PEN_INJECTOR | SUBCUTANEOUS | 3 refills | Status: DC

## 2022-03-03 NOTE — Telephone Encounter (Signed)
Just an FYI Attempting to get Ozempic covered this year Hoping insurance will

## 2022-03-05 ENCOUNTER — Other Ambulatory Visit: Payer: Self-pay | Admitting: Family Medicine

## 2022-03-05 DIAGNOSIS — R0602 Shortness of breath: Secondary | ICD-10-CM

## 2022-03-05 DIAGNOSIS — J441 Chronic obstructive pulmonary disease with (acute) exacerbation: Secondary | ICD-10-CM

## 2022-03-05 NOTE — Progress Notes (Unsigned)
Cardiology Office Note   Date:  03/08/2022   ID:  Scott Hatfield, DOB 1960-04-08, MRN [id number]  PCP:  Janora Norlander, DO  Cardiologist:   None   Chief Complaint  Patient presents with   Coronary Artery Disease      History of Present Illness: Scott Hatfield is a 62 y.o. male who presents for evaluation of coronary artery disease. He had a history of circumflex stenting in another town in 2008. He presented urgently in 10/12 with unstable angina and ST segment elevation and was found to have an occluded diagonal. He had a drug-eluting stent placed.  He was in the hospital in May of 2016 with unstable angina and was found to have 95% stenosis in the diagonal again treated with a drug-eluting stent.  He was again in the hospital in Dec of 2016.   He again had chest pain but cath at that time demonstrated non obstructive disease with patent stents.  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  He was managed medically.   Since I last saw him he he had an upper respiratory infection in December, was managed for some primary pulmonary process with shortness of breath.  He recovered with this.  He stopped smoking at that time and is on Chantix. The patient denies any new symptoms such as chest discomfort, neck or arm discomfort. There has been no new shortness of breath, PND or orthopnea. There have been no reported palpitations, presyncope or syncope.   Past Medical History:  Diagnosis Date   Allergy    occasionally    CAD in native artery    a. 2008 - Single-vessel CAD with stent placement to the circumflex  b. 2012 - DES to Diag.  c. 06/2014 - RCA 25% stenosis, LAD 30% stenosis, ramus intermediate 45% stenosis, D1 95% stenosis treated with DES  d.01/2015 - nonobstructive CAD   GERD (gastroesophageal reflux disease)    Hyperlipidemia, mixed    Hypertension    Myocardial infarction ([place]) 2008   Overweight(278.02)    Sleep apnea     CPAP    Past Surgical History:  Procedure Laterality Date   CARDIAC CATHETERIZATION N/A 07/03/2014   Procedure: Left Heart Cath and Coronary Angiography;  Surgeon: Peter M Martinique, MD;  Location: [place] [facility];  Service: Cardiovascular;  Laterality: N/A;   CARDIAC CATHETERIZATION N/A 07/03/2014   Procedure: Coronary Stent Intervention;  Surgeon: Peter M Martinique, MD;  Location: [place] [facility];  Service: Cardiovascular;  Laterality: N/A;   CARDIAC CATHETERIZATION N/A 01/26/2015   Procedure: Left Heart Cath and Coronary Angiography;  Surgeon: Peter M Martinique, MD;  Location: [place] [facility];  Service: Cardiovascular;  Laterality: N/A;   CORONARY ANGIOPLASTY     2016     3 stents   CORONARY STENT PLACEMENT  2008, 2012   MASS EXCISION N/A 10/24/2019   Procedure: EXCISION; CYST; RIGHT THIGH; PERINEUM;  Surgeon: Virl Cagey, MD;  Location: [facility];  Service: General;  Laterality: N/A;   wrist  surgery       Current Outpatient Medications  Medication Sig Dispense Refill   albuterol (VENTOLIN HFA) 108 (90 Base) MCG/ACT inhaler INHALE 2 PUFFS INTO LUNGS EVERY 6 HOURS AS NEEDED FOR WHEEZING OR SHORTNESS OF BREATH 18 g 0   amLODipine (NORVASC) 10 MG tablet Take 1 tablet by mouth once daily 90 tablet 0   aspirin 81 MG chewable  tablet Chew 81 mg by mouth daily.     atorvastatin (LIPITOR) 80 MG tablet Take 1 tablet (80 mg total) by mouth daily. 90 tablet 3   buPROPion (WELLBUTRIN XL) 150 MG 24 hr tablet TAKE 1 TABLET BY MOUTH ONCE DAILY IN THE MORNING 90 tablet 0   Fluticasone-Umeclidin-Vilant (TRELEGY ELLIPTA) 100-62.5-25 MCG/ACT AEPB Inhale 1 puff into the lungs daily. 1 each 0   hydrochlorothiazide (HYDRODIURIL) 25 MG tablet Take 1 tablet by mouth once daily 90 tablet 0   lisinopril (ZESTRIL) 40 MG tablet Take 1 tablet (40 mg total) by mouth daily. (NEEDS TO BE SEEN BEFORE NEXT REFILL) 30 tablet 0   metoprolol tartrate (LOPRESSOR) 50 MG tablet Take 1 tablet (50 mg total) by mouth 2 (two)  times daily. 180 tablet 3   nitroGLYCERIN (NITROSTAT) 0.4 MG SL tablet Place 1 tablet (0.4 mg total) under the tongue every 5 (five) minutes x 3 doses as needed for chest pain. 25 tablet 1   omeprazole (PRILOSEC) 20 MG capsule Take 1 capsule by mouth once daily 30 capsule 1   varenicline (CHANTIX CONTINUING MONTH PAK) 1 MG tablet Take 1 tablet (1 mg total) by mouth 2 (two) times daily. 60 tablet 1   No current facility-administered medications for this visit.    Allergies:   Metformin and related    ROS:  Please see the history of present illness.   Otherwise, review of systems are positive for none  .   All other systems are reviewed and negative.    PHYSICAL EXAM: VS:  BP (!) 152/100   Pulse 66   Ht 6' (1.829 m)   Wt 284 lb (128.8 kg)   SpO2 95%   BMI 38.52 kg/m  , BMI Body mass index is 38.52 kg/m. GENERAL:  Well appearing NECK:  No jugular venous distention, waveform within normal limits, carotid upstroke brisk and symmetric, no bruits, no thyromegaly LUNGS:  Clear to auscultation bilaterally CHEST:  Unremarkable HEART:  PMI not displaced or sustained,S1 and S2 within normal limits, no S3, no S4, no clicks, no rubs, no murmurs ABD:  Flat, positive bowel sounds normal in frequency in pitch, no bruits, no rebound, no guarding, no midline pulsatile mass, no hepatomegaly, no splenomegaly EXT:  2 plus pulses throughout, no edema, no cyanosis no clubbing   EKG:  EKG is not ordered today. The ekg ordered 02/17/2022 demonstrates sinus rhythm, rate 75, axis within normal limits, intervals within normal limits, no acute ST-T wave changes.   Recent Labs: 02/17/2022: ALT 31; BNP 59.1; BUN 11; Creatinine, Ser 0.86; Hemoglobin 16.2; Platelets 301; Potassium 4.3; Sodium 138; TSH 1.440    Lipid Panel    Component Value Date/Time   CHOL 160 07/20/2021 0847   TRIG 99 07/20/2021 0847   HDL 41 07/20/2021 0847   CHOLHDL 3.9 07/20/2021 0847   CHOLHDL 4.3 01/26/2015 0621   VLDL 26 01/26/2015  0621   LDLCALC 101 (H) 07/20/2021 0847      Wt Readings from Last 3 Encounters:  03/08/22 284 lb (128.8 kg)  02/17/22 277 lb 9.6 oz (125.9 kg)  01/23/22 285 lb (129.3 kg)      Other studies Reviewed: Additional studies/ records that were reviewed today include: Labs. Review of the above records demonstrates:  Please see elsewhere in the note.     ASSESSMENT AND PLAN:  CAD, NATIVE VESSEL -  The patient has no new sypmtoms.  No further cardiovascular testing is indicated.  We will continue with aggressive  risk reduction and meds as listed.  HYPERTENSION, UNSPECIFIED -  Blood pressure is elevated.  He is on multiple meds and I would prefer weight loss rather than titrating another meds.  He says at home his blood pressure is controlled we will keep a blood pressure diary.    HYPERLIPIDEMIA-MIXED -  LDL was not at target in June with an LDL of 101.  I will increase his Lipitor to 80 mg daily and get a lipid profile in 3 months.   TOBACCO ABUSE -  He is off cigarettes again and I applaud this and encouraged continued abstinence.  OVERWEIGHT/OBESITY -  As above we talked about more of a plant-based diet.   SLEEP APNEA -  His CPAP is working.  It was not working previously.  No change in therapy.    Current medicines are reviewed at length with the patient today.  The patient does not have concerns regarding medicines.  The following changes have been made:   None  Labs/ tests ordered today include: None   Orders Placed This Encounter  Procedures   Lipid panel     Disposition:   FU with me in 12 months    Signed, Minus Breeding, MD  03/08/2022 9:51 AM    [place]

## 2022-03-08 ENCOUNTER — Other Ambulatory Visit: Payer: Self-pay | Admitting: *Deleted

## 2022-03-08 ENCOUNTER — Ambulatory Visit: Payer: Commercial Managed Care - PPO | Admitting: Cardiology

## 2022-03-08 ENCOUNTER — Encounter: Payer: Self-pay | Admitting: Cardiology

## 2022-03-08 VITALS — BP 152/100 | HR 66 | Ht 72.0 in | Wt 284.0 lb

## 2022-03-08 DIAGNOSIS — I2510 Atherosclerotic heart disease of native coronary artery without angina pectoris: Secondary | ICD-10-CM | POA: Diagnosis not present

## 2022-03-08 DIAGNOSIS — I10 Essential (primary) hypertension: Secondary | ICD-10-CM | POA: Diagnosis not present

## 2022-03-08 DIAGNOSIS — E785 Hyperlipidemia, unspecified: Secondary | ICD-10-CM | POA: Diagnosis not present

## 2022-03-08 DIAGNOSIS — Z720 Tobacco use: Secondary | ICD-10-CM

## 2022-03-08 DIAGNOSIS — Z79899 Other long term (current) drug therapy: Secondary | ICD-10-CM

## 2022-03-08 MED ORDER — ATORVASTATIN CALCIUM 80 MG PO TABS: 80.0000 mg | ORAL_TABLET | Freq: Every day | ORAL | 3 refills | Status: DC

## 2022-03-08 NOTE — Patient Instructions (Signed)
Medication Instructions:  Please increase your Atorvastatin to 80 mg a day. Continue all other medications as listed.  *If you need a refill on your cardiac medications before your next appointment, please call your pharmacy*   Lab Work: Please have fasting Lipid panel in 3 months at [facility].  If you have labs (blood work) drawn today and your tests are completely normal, you will receive your results only by: [place]inda (if you have MyChart) OR A paper copy in the mail If you have any lab test that is abnormal or we need to change your treatment, we will call you to review the results.  Follow-Up: At [facility], you and your health needs are our priority.  As part of our continuing mission to provide you with exceptional heart care, we have created designated Provider Care Teams.  These Care Teams include your primary Cardiologist (physician) and Advanced Practice Providers (APPs -  Physician Assistants and Nurse Practitioners) who all work together to provide you with the care you need, when you need it.  We recommend signing up for the patient portal called "MyChart".  Sign up information is provided on this After Visit Summary.  MyChart is used to connect with patients for Virtual Visits (Telemedicine).  Patients are able to view lab/test results, encounter notes, upcoming appointments, etc.  Non-urgent messages can be sent to your provider as well.   To learn more about what you can do with MyChart, go to [email or web address].    Your next appointment:   1 year(s)  Provider:   Minus Breeding, MD

## 2022-03-10 NOTE — Telephone Encounter (Signed)
   PA submitted through [employer] portal

## 2022-03-23 ENCOUNTER — Encounter: Payer: Self-pay | Admitting: Internal Medicine

## 2022-03-23 ENCOUNTER — Ambulatory Visit: Payer: Commercial Managed Care - PPO | Admitting: Internal Medicine

## 2022-03-23 VITALS — BP 142/80 | HR 68 | Ht 72.0 in | Wt 287.4 lb

## 2022-03-23 DIAGNOSIS — J45901 Unspecified asthma with (acute) exacerbation: Secondary | ICD-10-CM

## 2022-03-23 DIAGNOSIS — Z87891 Personal history of nicotine dependence: Secondary | ICD-10-CM | POA: Diagnosis not present

## 2022-03-23 DIAGNOSIS — J4521 Mild intermittent asthma with (acute) exacerbation: Secondary | ICD-10-CM | POA: Diagnosis not present

## 2022-03-23 DIAGNOSIS — R0609 Other forms of dyspnea: Secondary | ICD-10-CM | POA: Diagnosis not present

## 2022-03-23 HISTORY — DX: Unspecified asthma with (acute) exacerbation: J45.901

## 2022-03-23 MED ORDER — BREZTRI AEROSPHERE 160-9-4.8 MCG/ACT IN AERO: 2.0000 | INHALATION_SPRAY | Freq: Two times a day (BID) | RESPIRATORY_TRACT | 6 refills | Status: DC

## 2022-03-23 NOTE — Assessment & Plan Note (Signed)
Stopped smoking Jan 2024  - CT chest 01/23/22  in setting of AB flare with small linear area of atx along R mediastinum likely a small mucus plug so 03/23/2022 rec just start LDSCT yearly   I don't believe this linear area is of any concern but stronlgy rec LDSCT  Low-dose CT lung cancer screening is recommended for patients who are 77-62 years of age with a 20+ pack-year history of smoking and who are currently smoking or quit <=15 years ago. No coughing up blood  No unintentional weight loss of > 15 pounds in the last 6 months - pt is eligible for scanning yearly until 2039 > referred          Each maintenance medication was reviewed in detail including emphasizing most importantly the difference between maintenance and prns and under what circumstances the prns are to be triggered using an action plan format where appropriate.  Total time for H and P, chart review, counseling, reviewing hfa  device(s) and generating customized AVS unique to this office visit / same day charting = 45 min new pt eval

## 2022-03-23 NOTE — Patient Instructions (Addendum)
My office will be contacting you by phone for referral to lung cancer screening program  and PFTS - if you don't hear back from my office within one week please call us back or notify us thru MyChart and we'll address it right away.    Breztri 1-2  puffs every 12 hours as needed until you have your pfts   Only use your albuterol as a rescue medication to be used if you can't catch your breath by resting or doing a relaxed purse lip breathing pattern.  - The less you use it, the better it will work when you need it. - Ok to use up to 2 puffs  every 4 hours if you must but if you find you need it all the time then you need to be on Breztri or Trelegy  - Don't leave home without it !!  (think of it like starter fluid  for your car)   Also Ok to try albuterol 15 min before an activity (on alternating days)  that you know would usually make you short of breath and see if it makes any difference and if makes none then don't take albuterol after activity unless you can't catch your breath as this means it's the resting that helps, not the albuterol.

## 2022-03-23 NOTE — Assessment & Plan Note (Signed)
Flared dec 2023 > quit smoking 02/2022 and stopped all inhalers late Jan 2024 s recurrence  - 03/23/2022  After extensive coaching inhaler device,  effectiveness =    60%(short ti)   Now that he's quit smoking he's been able to stop all inhalers and still clear on exam so ok to use saba prn but if starts needing more than twice daily would start back on maint rx with either trelegy 100 or breztri - the latter more effective as a prn since tends to stop his meds when he doesn't think they are helping and the breztri has a more brisk onset of action.  Needs f/u pft's next available.

## 2022-03-23 NOTE — Progress Notes (Signed)
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 Copd  p developing cough and sob the month of Dec 2023 > resolved    History of Present Illness  03/23/2022  Pulmonary/ 1st office eval/ Norvell Ureste / [place] off telegy x 2 weeks / wt 287  Chief Complaint  Patient presents with   Consult     Onset of sob was 01/2022.  Rx Trelegy and albuterol as needed he states that he can't tel the difference.   Does have SOB, cough and wheeze  Dyspnea:  MMRC1 = can walk nl pace, flat grade, can't hurry or go uphills or steps s sob   Cough: none  Sleep: cpap sleeps fine  SABA use: none  02: none  Lung cancer screen: planned   No obvious day to day or daytime pattern/variability or assoc excess/ purulent sputum or mucus plugs or hemoptysis or cp or chest tightness, subjective wheeze or overt sinus or hb symptoms.   Sleeping now  without nocturnal  or early am exacerbation  of respiratory  c/o's or need for noct saba. Also denies any obvious fluctuation of symptoms with weather or environmental changes or other aggravating or alleviating factors except as outlined above   No unusual exposure hx or h/o childhood pna/ asthma or knowledge of premature birth.  Current Allergies, Complete Past Medical History, Past Surgical History, Family History, and Social History were reviewed in [employer] record.  ROS  The following are not active complaints unless bolded Hoarseness, sore throat, dysphagia, dental problems, itching, sneezing,  nasal congestion or discharge of excess mucus or purulent secretions, ear ache,   fever, chills, sweats, unintended wt loss or wt gain, classically pleuritic or exertional cp,  orthopnea pnd or arm/hand swelling  or leg swelling, presyncope, palpitations, abdominal pain, anorexia, nausea,  vomiting, diarrhea  or change in bowel habits or change in bladder habits, change in stools or change in urine, dysuria, hematuria,  rash, arthralgias, visual complaints, headache, numbness, weakness or ataxia or problems with walking or coordination,  change in mood or  memory.           Past Medical History:  Diagnosis Date   Allergy    occasionally    CAD in native artery    a. 2008 - Single-vessel CAD with stent placement to the circumflex  b. 2012 - DES to Diag.  c. 06/2014 - RCA 25% stenosis, LAD 30% stenosis, ramus intermediate 45% stenosis, D1 95% stenosis treated with DES  d.01/2015 - nonobstructive CAD   GERD (gastroesophageal reflux disease)    Hyperlipidemia, mixed    Hypertension    Myocardial infarction ([place]) 2008   Overweight(278.02)    Sleep apnea    CPAP    Outpatient Medications Prior to Visit  Medication Sig Dispense Refill   albuterol (VENTOLIN HFA) 108 (90 Base) MCG/ACT inhaler INHALE 2 PUFFS INTO LUNGS EVERY 6 HOURS AS NEEDED FOR WHEEZING OR SHORTNESS OF BREATH 18 g 0   amLODipine (NORVASC) 10 MG tablet Take 1 tablet by mouth once daily 90 tablet 0   aspirin 81 MG chewable tablet Chew 81 mg by mouth daily.     atorvastatin (LIPITOR) 80 MG tablet Take 1 tablet (80 mg total) by mouth daily. 90 tablet 3   buPROPion [facility]  XL) 150 MG 24 hr tablet TAKE 1 TABLET BY MOUTH ONCE DAILY IN THE MORNING 90 tablet 0   Fluticasone-Umeclidin-Vilant (TRELEGY ELLIPTA) 100-62.5-25 MCG/ACT AEPB Inhale 1 puff into the lungs daily. 1 each 0   hydrochlorothiazide (HYDRODIURIL) 25 MG tablet Take 1 tablet by mouth once daily 90 tablet 0   lisinopril (ZESTRIL) 40 MG tablet Take 40 mg by mouth daily.     metoprolol tartrate (LOPRESSOR) 50 MG tablet Take 1 tablet (50 mg total) by mouth 2 (two) times daily. 180 tablet 3   nitroGLYCERIN (NITROSTAT) 0.4 MG SL tablet Place 1 tablet (0.4 mg total) under the tongue every 5 (five) minutes x 3 doses as needed for chest pain. 25 tablet 1    omeprazole (PRILOSEC) 20 MG capsule Take 1 capsule by mouth once daily 30 capsule 1   varenicline (CHANTIX CONTINUING MONTH PAK) 1 MG tablet Take 1 tablet (1 mg total) by mouth 2 (two) times daily. 60 tablet 1   No facility-administered medications prior to visit.     Objective:     BP (!) 142/80   Pulse 68   Ht 6' (1.829 m)   Wt 287 lb 6.4 oz (130.4 kg)   SpO2 94%   BMI 38.98 kg/m   SpO2: 94 %  Mod obese (by BMI) pleasant amb wm nad   HEENT : Oropharynx  clear        NECK :  without  apparent JVD/ palpable Nodes/TM    LUNGS: no acc muscle use,  Nl contour chest which is clear to A and P bilaterally without cough on insp or exp maneuvers   CV:  RRR  no s3 or murmur or increase in P2, and no edema   ABD:  obese soft and nontender with nl inspiratory excursion in the supine position. No bruits or organomegaly appreciated   MS:  Nl gait/ ext warm without deformities Or obvious joint restrictions  calf tenderness, cyanosis or clubbing    SKIN: warm and dry without lesions    NEURO:  alert, approp, nl sensorium with  no motor or cerebellar deficits apparent.     I personally reviewed images and agree with radiology impression as follows:   Chest CT w contrast    01/23/22    1. Band of atelectasis in the medial RIGHT upper lobe. Unclear etiology. No obstructing lesion identified. No suspicious pulmonary nodule. 2. No mediastinal lymphadenopathy.      Assessment   Asthmatic bronchitis with acute exacerbation Flared dec 2023 > quit smoking 02/2022 and stopped all inhalers late Jan 2024 s recurrence  - 03/23/2022  After extensive coaching inhaler device,  effectiveness =    60%(short ti)   Now that he's quit smoking he's been able to stop all inhalers and still clear on exam so ok to use saba prn but if starts needing more than twice daily would start back on maint rx with either trelegy 100 or breztri - the latter more effective as a prn since tends to stop his meds  when he doesn't think they are helping and the breztri has a more brisk onset of action.  Needs f/u pft's next available.  Former smoker Stopped smoking Jan 2024  - CT chest 01/23/22  in setting of AB flare with small linear area of atx along R mediastinum likely a small mucus plug so 03/23/2022 rec just start LDSCT yearly   I don't believe this linear area is of any concern but stronlgy rec LDSCT  Low-dose CT lung cancer screening is recommended for patients who are 70-35 years of age with a 20+ pack-year history of smoking and who are currently smoking or quit <=15 years ago. No coughing up blood  No unintentional weight loss of > 15 pounds in the last 6 months - pt is eligible for scanning yearly until 2039 > referred          Each maintenance medication was reviewed in detail including emphasizing most importantly the difference between maintenance and prns and under what circumstances the prns are to be triggered using an action plan format where appropriate.  Total time for H and P, chart review, counseling, reviewing hfa  device(s) and generating customized AVS unique to this office visit / same day charting = 45 min new pt eval            Christinia Gully, MD 03/23/2022

## 2022-03-24 ENCOUNTER — Other Ambulatory Visit: Payer: Self-pay | Admitting: Family Medicine

## 2022-03-24 ENCOUNTER — Other Ambulatory Visit (HOSPITAL_COMMUNITY): Payer: Self-pay

## 2022-03-24 NOTE — Telephone Encounter (Signed)
[place] NTBS 30 days given 02/14/22

## 2022-03-27 ENCOUNTER — Encounter: Payer: Self-pay | Admitting: Family Medicine

## 2022-03-27 ENCOUNTER — Ambulatory Visit: Payer: Commercial Managed Care - PPO | Admitting: Family Medicine

## 2022-03-27 VITALS — BP 140/82 | HR 72 | Temp 97.9°F | Ht 72.0 in | Wt 294.0 lb

## 2022-03-27 DIAGNOSIS — I2510 Atherosclerotic heart disease of native coronary artery without angina pectoris: Secondary | ICD-10-CM

## 2022-03-27 DIAGNOSIS — I10 Essential (primary) hypertension: Secondary | ICD-10-CM | POA: Diagnosis not present

## 2022-03-27 DIAGNOSIS — E1165 Type 2 diabetes mellitus with hyperglycemia: Secondary | ICD-10-CM | POA: Diagnosis not present

## 2022-03-27 DIAGNOSIS — L720 Epidermal cyst: Secondary | ICD-10-CM | POA: Insufficient documentation

## 2022-03-27 DIAGNOSIS — Z87891 Personal history of nicotine dependence: Secondary | ICD-10-CM

## 2022-03-27 DIAGNOSIS — Z9861 Coronary angioplasty status: Secondary | ICD-10-CM

## 2022-03-27 HISTORY — DX: Epidermal cyst: L72.0

## 2022-03-27 LAB — BAYER DCA HB A1C WAIVED: HB A1C (BAYER DCA - WAIVED): 8.2 % — ABNORMAL HIGH (ref 4.8–5.6)

## 2022-03-27 MED ORDER — NITROGLYCERIN 0.4 MG SL SUBL: 0.4000 mg | SUBLINGUAL_TABLET | SUBLINGUAL | 1 refills | Status: DC | PRN

## 2022-03-27 MED ORDER — HYDROCHLOROTHIAZIDE 25 MG PO TABS: 25.0000 mg | ORAL_TABLET | Freq: Every day | ORAL | 3 refills | Status: DC

## 2022-03-27 MED ORDER — BUPROPION HCL ER (XL) 150 MG PO TB24: 150.0000 mg | ORAL_TABLET | Freq: Every morning | ORAL | 3 refills | Status: DC

## 2022-03-27 MED ORDER — AMLODIPINE BESYLATE 10 MG PO TABS: 10.0000 mg | ORAL_TABLET | Freq: Every day | ORAL | 3 refills | Status: DC

## 2022-03-27 MED ORDER — OZEMPIC (0.25 OR 0.5 MG/DOSE) 2 MG/3ML ~~LOC~~ SOPN: PEN_INJECTOR | SUBCUTANEOUS | 0 refills | Status: DC

## 2022-03-27 MED ORDER — VARENICLINE TARTRATE 1 MG PO TABS: 1.0000 mg | ORAL_TABLET | Freq: Two times a day (BID) | ORAL | 1 refills | Status: DC

## 2022-03-27 NOTE — Progress Notes (Signed)
Subjective: CC:Dm PCP: Janora Norlander, DO [id number] Scott Hatfield is a 62 y.o. male presenting to clinic today for:  1. Type 2 Diabetes with hypertension, hyperlipidemia:  Patient notes that he has been off of the Ozempic for 2 months now.  His insurance refused to cover it despite history of type 2 diabetes and CAD.  They essentially wanted him to have an A1c within the last 6 months of over 7.  He denies any polydipsia, polyuria, chest pain, shortness of breath, blurred vision or dizziness.  He saw the pulmonologist since her last visit and will be having pulmonary function test performed in May.  Currently not using the [facility] as he has not been symptomatic.  He does report to me that he has been successfully off of cigarettes since January 3 is very proud of this.  He continues to use the Chantix and would like to have an additional month or so refills to reduce risk of rebound  Last eye exam: Needs Last foot exam: Needs Last A1c:  Lab Results  Component Value Date   HGBA1C 8.2 (H) 03/27/2022   Nephropathy screen indicated?:  Needs Last flu, zoster and/or pneumovax:  Immunization History  Administered Date(s) Administered   Influenza, Seasonal, Injecte, Preservative Fre 12/03/2018   Influenza,inj,Quad PF,6+ Mos 12/08/2014, 04/04/2016, 12/25/2016, 12/28/2017   Tdap 12/28/2017    ROS: Has a lump on his back that he would like me to look at   ROS: Per HPI  Allergies  Allergen Reactions   Metformin And Related Nausea Only   Past Medical History:  Diagnosis Date   Allergy    occasionally    CAD in native artery    a. 2008 - Single-vessel CAD with stent placement to the circumflex  b. 2012 - DES to Diag.  Scott. 06/2014 - RCA 25% stenosis, LAD 30% stenosis, ramus intermediate 45% stenosis, D1 95% stenosis treated with DES  d.01/2015 - nonobstructive CAD   GERD (gastroesophageal reflux disease)    Hyperlipidemia, mixed    Hypertension    Myocardial infarction ([place]) 2008    Overweight(278.02)    Sleep apnea    CPAP    Current Outpatient Medications:    albuterol (VENTOLIN HFA) 108 (90 Base) MCG/ACT inhaler, INHALE 2 PUFFS INTO LUNGS EVERY 6 HOURS AS NEEDED FOR WHEEZING OR SHORTNESS OF BREATH, Disp: 18 g, Rfl: 0   aspirin 81 MG chewable tablet, Chew 81 mg by mouth daily., Disp: , Rfl:    atorvastatin (LIPITOR) 80 MG tablet, Take 1 tablet (80 mg total) by mouth daily., Disp: 90 tablet, Rfl: 3   Budeson-Glycopyrrol-Formoterol (BREZTRI AEROSPHERE) 160-9-4.8 MCG/ACT AERO, Inhale 2 puffs into the lungs in the morning and at bedtime., Disp: 10.7 g, Rfl: 6   lisinopril (ZESTRIL) 40 MG tablet, Take 40 mg by mouth daily., Disp: , Rfl:    metoprolol tartrate (LOPRESSOR) 50 MG tablet, Take 1 tablet (50 mg total) by mouth 2 (two) times daily., Disp: 180 tablet, Rfl: 3   omeprazole (PRILOSEC) 20 MG capsule, Take 1 capsule by mouth once daily, Disp: 30 capsule, Rfl: 1   Semaglutide,0.25 or 0.5MG/DOS, (OZEMPIC, 0.25 OR 0.5 MG/DOSE,) 2 MG/3ML SOPN, Inject 0.25 mg into the skin every 7 (seven) days for 28 days, THEN 0.5 mg every 7 (seven) days., Disp: 9 mL, Rfl: 0   amLODipine (NORVASC) 10 MG tablet, Take 1 tablet (10 mg total) by mouth daily., Disp: 90 tablet, Rfl: 3   buPROPion (WELLBUTRIN XL) 150 MG 24 hr tablet,  Take 1 tablet (150 mg total) by mouth every morning., Disp: 90 tablet, Rfl: 3   hydrochlorothiazide (HYDRODIURIL) 25 MG tablet, Take 1 tablet (25 mg total) by mouth daily., Disp: 90 tablet, Rfl: 3   nitroGLYCERIN (NITROSTAT) 0.4 MG SL tablet, Place 1 tablet (0.4 mg total) under the tongue every 5 (five) minutes x 3 doses as needed for chest pain., Disp: 25 tablet, Rfl: 1   varenicline (CHANTIX CONTINUING MONTH PAK) 1 MG tablet, Take 1 tablet (1 mg total) by mouth 2 (two) times daily., Disp: 60 tablet, Rfl: 1 Social History   Socioeconomic History   Marital status: Married    Spouse name: Not on file   Number of children: Not on file   Years of education: Not on file    Highest education level: Not on file  Occupational History   Occupation: Full time    Employer: [employer]  Tobacco Use   Smoking status: Former    Types: Cigarettes    Start date: 03/20/2012    Quit date: 02/15/2022    Years since quitting: 0.1   Smokeless tobacco: Never  Vaping Use   Vaping Use: Never used  Substance and Sexual Activity   Alcohol use: Yes    Alcohol/week: 0.0 standard drinks of alcohol    Comment: occ   Drug use: No   Sexual activity: Not on file  Other Topics Concern   Not on file  Social History Narrative   Married   No regular exercise   Social Determinants of Health   Financial Resource Strain: Not on file  Food Insecurity: Not on file  Transportation Needs: Not on file  Physical Activity: Not on file  Stress: Not on file  Social Connections: Not on file  Intimate Partner Violence: Not on file   Family History  Problem Relation Age of Onset   Heart disease Mother    Colon polyps Father    Coronary artery disease Other    Colon polyps Sister    Colon cancer Neg Hx    Esophageal cancer Neg Hx    Rectal cancer Neg Hx    Stomach cancer Neg Hx     Objective: Office vital signs reviewed. BP (!) 140/82   Pulse 72   Temp 97.9 F (36.6 [place])   Ht 6' (1.829 m)   Wt 294 lb (133.4 kg)   SpO2 94%   BMI 39.87 kg/m   Physical Examination:  General: Awake, alert, morbidly obese, No acute distress HEENT:  sclera white, MMM Cardio: regular rate and rhythm, S1S2 heard, no murmurs appreciated Pulm: clear to auscultation bilaterally, no wheezes, rhonchi or rales; normal work of breathing on room air Skin: Grape sized epidermoid cyst noted at the right lower back.  Central punctum present.  No appreciable erythema, fluctuance or induration Neuro: see DM foot Diabetic Foot Exam - Simple   Simple Foot Form Diabetic Foot exam was performed with the following findings: Yes 03/27/2022  5:55 PM  Visual Inspection No deformities, no ulcerations, no  other skin breakdown bilaterally: Yes Sensation Testing Intact to touch and monofilament testing bilaterally: Yes Pulse Check Posterior Tibialis and Dorsalis pulse intact bilaterally: Yes Comments     Assessment/ Plan: 62 y.o. male   Uncontrolled type 2 diabetes mellitus with hyperglycemia ([place]) - Plan: Microalbumin / creatinine urine ratio, Bayer DCA Hb A1c Waived, Semaglutide,0.25 or 0.5MG/DOS, (OZEMPIC, 0.25 OR 0.5 MG/DOSE,) 2 MG/3ML SOPN  CAD- S/P PCI '08, 2012, Dx DES 07/03/14 - Plan: amLODipine ([place])  10 MG tablet, nitroGLYCERIN (NITROSTAT) 0.4 MG SL tablet  Essential hypertension - Plan: amLODipine (NORVASC) 10 MG tablet, hydrochlorothiazide (HYDRODIURIL) 25 MG tablet  Morbid obesity (HCC) - Plan: buPROPion (WELLBUTRIN XL) 150 MG 24 hr tablet  Former heavy tobacco smoker - Plan: buPROPion (WELLBUTRIN XL) 150 MG 24 hr tablet, varenicline (CHANTIX CONTINUING MONTH PAK) 1 MG tablet  Epidermoid cyst of skin of back  Diabetes certainly uncontrolled and has orally gone up quite a bit since he was being treated with Ozempic.  His A1c now jumping to 8.2.  I can only imagine what the level would be if he had gone a full 3 months since his last medication treatment.  He unfortunately has been intolerant of metformin so that was not reinitiated today but I did place him back on the Ozempic and we will be resubmitting his prior authorization given these recurrent findings.  Blood pressure is controlled.  No changes.  Medications have been renewed  I congratulated him on smoking cessation.  Medications have been renewed  Lesion of concern appears consistent with an epidermoid cyst.  It does not appear to be complicated at this time with no evidence of infection or active inflammation.  Glad to place referral back to general surgery for excision if it becomes symptomatic  Orders Placed This Encounter  Procedures   Microalbumin / creatinine urine ratio   Bayer DCA Hb A1c Waived   Meds  ordered this encounter  Medications   Semaglutide,0.25 or 0.5MG/DOS, (OZEMPIC, 0.25 OR 0.5 MG/DOSE,) 2 MG/3ML SOPN    Sig: Inject 0.25 mg into the skin every 7 (seven) days for 28 days, THEN 0.5 mg every 7 (seven) days.    Dispense:  9 mL    Refill:  0   amLODipine (NORVASC) 10 MG tablet    Sig: Take 1 tablet (10 mg total) by mouth daily.    Dispense:  90 tablet    Refill:  3   buPROPion (WELLBUTRIN XL) 150 MG 24 hr tablet    Sig: Take 1 tablet (150 mg total) by mouth every morning.    Dispense:  90 tablet    Refill:  3   hydrochlorothiazide (HYDRODIURIL) 25 MG tablet    Sig: Take 1 tablet (25 mg total) by mouth daily.    Dispense:  90 tablet    Refill:  3   nitroGLYCERIN (NITROSTAT) 0.4 MG SL tablet    Sig: Place 1 tablet (0.4 mg total) under the tongue every 5 (five) minutes x 3 doses as needed for chest pain.    Dispense:  25 tablet    Refill:  1   varenicline (CHANTIX CONTINUING MONTH PAK) 1 MG tablet    Sig: Take 1 tablet (1 mg total) by mouth 2 (two) times daily.    Dispense:  60 tablet    Refill:  Patrick, DO [place] [phone]

## 2022-03-27 NOTE — Telephone Encounter (Signed)
APPT SCHEDULED FOR 03/27/2022

## 2022-03-29 LAB — MICROALBUMIN / CREATININE URINE RATIO
Creatinine, Urine: 36.4 mg/dL
Microalb/Creat Ratio: <8 mg/g creat (ref 0–29)
Microalbumin, Urine: <3 ug/mL

## 2022-03-30 NOTE — Telephone Encounter (Signed)
Unfortunately, I'm not able to read the information in the screenshot. Has your office received any word back on this?

## 2022-03-31 NOTE — Telephone Encounter (Signed)
Research officer, trade union.   Ozempic will not clear insurance. Must assume that it was denied.

## 2022-04-07 NOTE — Telephone Encounter (Signed)
Prior authorization APPROVED from 03/31/2022-02/12/2098

## 2022-04-13 ENCOUNTER — Encounter: Payer: Self-pay | Admitting: Radiology

## 2022-04-14 ENCOUNTER — Other Ambulatory Visit: Payer: Self-pay | Admitting: Family Medicine

## 2022-04-14 DIAGNOSIS — K209 Esophagitis, unspecified without bleeding: Secondary | ICD-10-CM

## 2022-04-19 ENCOUNTER — Other Ambulatory Visit: Payer: Self-pay | Admitting: Family Medicine

## 2022-05-18 ENCOUNTER — Other Ambulatory Visit: Payer: Self-pay | Admitting: Family Medicine

## 2022-05-18 NOTE — Telephone Encounter (Signed)
[facility] patient Last office visit 03/27/22 Medication listed on med list as historical, requires provider approval

## 2022-05-29 ENCOUNTER — Encounter: Payer: Self-pay | Admitting: Family Medicine

## 2022-05-30 ENCOUNTER — Ambulatory Visit (INDEPENDENT_AMBULATORY_CARE_PROVIDER_SITE_OTHER): Payer: Commercial Managed Care - PPO | Admitting: Family Medicine

## 2022-05-30 ENCOUNTER — Encounter: Payer: Self-pay | Admitting: Family Medicine

## 2022-05-30 VITALS — BP 137/88 | HR 74 | Temp 98.7°F | Ht 72.0 in | Wt 294.0 lb

## 2022-05-30 DIAGNOSIS — R600 Localized edema: Secondary | ICD-10-CM | POA: Diagnosis not present

## 2022-05-30 DIAGNOSIS — R5382 Chronic fatigue, unspecified: Secondary | ICD-10-CM | POA: Diagnosis not present

## 2022-05-30 DIAGNOSIS — E1169 Type 2 diabetes mellitus with other specified complication: Secondary | ICD-10-CM | POA: Diagnosis not present

## 2022-05-30 DIAGNOSIS — Z9861 Coronary angioplasty status: Secondary | ICD-10-CM

## 2022-05-30 DIAGNOSIS — I2510 Atherosclerotic heart disease of native coronary artery without angina pectoris: Secondary | ICD-10-CM | POA: Diagnosis not present

## 2022-05-30 LAB — BAYER DCA HB A1C WAIVED: HB A1C (BAYER DCA - WAIVED): 7 % — ABNORMAL HIGH (ref 4.8–5.6)

## 2022-05-30 MED ORDER — FUROSEMIDE 40 MG PO TABS: 40.0000 mg | ORAL_TABLET | Freq: Every day | ORAL | 3 refills | Status: DC | PRN

## 2022-05-30 MED ORDER — LISINOPRIL 40 MG PO TABS: ORAL_TABLET | ORAL | 3 refills | Status: DC

## 2022-05-30 NOTE — Patient Instructions (Signed)
Chronic Venous Insufficiency Chronic venous insufficiency is a condition where the leg veins cannot effectively pump blood from the legs to the heart. This happens when the vein walls are either stretched, weakened, or damaged, or when the valves inside the vein are damaged. With the right treatment, you should be able to continue with an active life. This condition is also called venous stasis. What are the causes? Common causes of this condition include: High blood pressure inside the veins (venous hypertension). Sitting or standing too long, causing increased blood pressure in the leg veins. A blood clot that blocks blood flow in a vein (deep vein thrombosis, DVT). Inflammation of a vein (phlebitis) that causes a blood clot to form. Tumors in the pelvis that cause blood to back up. What increases the risk? The following factors may make you more likely to develop this condition: Having a family history of this condition. Obesity. Pregnancy. Living without enough regular physical activity or exercise (sedentary lifestyle). Smoking. Having a job that requires long periods of standing or sitting in one place. Being a certain age. Women in their 40s and 50s and men in their 70s are more likely to develop this condition. What are the signs or symptoms? Symptoms of this condition include: Veins that are enlarged, bulging, or twisted (varicose veins). Skin breakdown or ulcers. Reddened skin or dark discoloration of skin on the leg between the knee and ankle. Brown, smooth, tight, and painful skin just above the ankle, usually on the inside of the leg (lipodermatosclerosis). Swelling of the legs. How is this diagnosed? This condition may be diagnosed based on: Your medical history. A physical exam. Tests, such as: A procedure that creates an image of a blood vessel and nearby organs and provides information about blood flow through the blood vessel (duplex ultrasound). A procedure that  tests blood flow (plethysmography). A procedure that looks at the veins using X-ray and dye (venogram). How is this treated? The goals of treatment are to help you return to an active life and to minimize pain or disability. Treatment depends on the severity of your condition, and it may include: Wearing compression stockings. These can help relieve symptoms and help prevent your condition from getting worse. However, they do not cure the condition. Sclerotherapy. This procedure involves an injection of a solution that shrinks damaged veins. Surgery. This may involve: Removing a diseased vein (vein stripping). Cutting off blood flow through the vein (laser ablation surgery). Repairing or reconstructing a valve within the affected vein. Follow these instructions at home:     Wear compression stockings as told by your health care provider. These stockings help to prevent blood clots and reduce swelling in your legs. Take over-the-counter and prescription medicines only as told by your health care provider. Stay active by exercising, walking, or doing different activities. Ask your health care provider what activities are safe for you and how much exercise you need. Drink enough fluid to keep your urine pale yellow. Do not use any products that contain nicotine or tobacco, such as cigarettes, e-cigarettes, and chewing tobacco. If you need help quitting, ask your health care provider. Keep all follow-up visits as told by your health care provider. This is important. Contact a health care provider if you: Have redness, swelling, or more pain in the affected area. See a red streak or line that goes up or down from the affected area. Have skin breakdown or skin loss in the affected area, even if the breakdown is small. Get   an injury in the affected area. Get help right away if: You get an injury and an open wound in the affected area. You have: Severe pain that does not get better with  medicine. Sudden numbness or weakness in the foot or ankle below the affected area. Trouble moving your foot or ankle. A fever. Worse or persistent symptoms. Chest pain. Shortness of breath. Summary Chronic venous insufficiency is a condition where the leg veins cannot effectively pump blood from the legs to the heart. Chronic venous insufficiency occurs when the vein walls become stretched, weakened, or damaged, or when valves within the vein are damaged. Treatment depends on how severe your condition is. It often involves wearing compression stockings and may involve having a procedure. Make sure you stay active by exercising, walking, or doing different activities. Ask your health care provider what activities are safe for you and how much exercise you need. This information is not intended to replace advice given to you by your health care provider. Make sure you discuss any questions you have with your health care provider. Document Revised: 04/12/2020 Document Reviewed: 04/13/2020 Elsevier Patient Education  2023 Elsevier Inc.  

## 2022-05-30 NOTE — Progress Notes (Signed)
Subjective: CC: Lower extremity edema PCP: Scott Ip, DO [id number] [name] is a 62 y.o. male presenting to clinic today for:  1.  Lower extremity edema Patient reports bilateral lower extremity edema that has been ongoing for the last couple of weeks.  The legs seem to go down each morning but get worse throughout the day.  He is not spending as much time in the vehicle as he was for work and is primarily at the office now.  He is compliant with his antihypertensives and has had no medication changes since last visit.  Denies excessive salt use.  Has reported some ongoing dyspnea on exertion that seem to initially be relieved by Trelegy.  He notes he needs more of this medication.  Has follow-up with both pulmonology and cardiology in the next couple of months   ROS: Per HPI  Allergies  Allergen Reactions   Metformin And Related Nausea Only   Past Medical History:  Diagnosis Date   Allergy    occasionally    CAD in native artery    a. 2008 - Single-vessel CAD with stent placement to the circumflex  b. 2012 - DES to Diag.  c. 06/2014 - RCA 25% stenosis, LAD 30% stenosis, ramus intermediate 45% stenosis, D1 95% stenosis treated with DES  d.01/2015 - nonobstructive CAD   GERD (gastroesophageal reflux disease)    Hyperlipidemia, mixed    Hypertension    Myocardial infarction (HCC) 2008   Overweight(278.02)    Sleep apnea    CPAP    Current Outpatient Medications:    albuterol (VENTOLIN HFA) 108 (90 Base) MCG/ACT inhaler, INHALE 2 PUFFS INTO LUNGS EVERY 6 HOURS AS NEEDED FOR WHEEZING OR SHORTNESS OF BREATH, Disp: 18 g, Rfl: 0   amLODipine (NORVASC) 10 MG tablet, Take 1 tablet (10 mg total) by mouth daily., Disp: 90 tablet, Rfl: 3   aspirin 81 MG chewable tablet, Chew 81 mg by mouth daily., Disp: , Rfl:    atorvastatin (LIPITOR) 80 MG tablet, Take 1 tablet (80 mg total) by mouth daily., Disp: 90 tablet, Rfl: 3   Budeson-Glycopyrrol-Formoterol (BREZTRI AEROSPHERE)  160-9-4.8 MCG/ACT AERO, Inhale 2 puffs into the lungs in the morning and at bedtime., Disp: 10.7 g, Rfl: 6   buPROPion (WELLBUTRIN XL) 150 MG 24 hr tablet, Take 1 tablet (150 mg total) by mouth every morning., Disp: 90 tablet, Rfl: 3   hydrochlorothiazide (HYDRODIURIL) 25 MG tablet, Take 1 tablet (25 mg total) by mouth daily., Disp: 90 tablet, Rfl: 3   lisinopril (ZESTRIL) 40 MG tablet, TAKE 1 TABLET BY MOUTH ONCE DAILY . APPOINTMENT REQUIRED FOR FUTURE REFILLS, Disp: 30 tablet, Rfl: 0   metoprolol tartrate (LOPRESSOR) 50 MG tablet, Take 1 tablet (50 mg total) by mouth 2 (two) times daily., Disp: 180 tablet, Rfl: 3   nitroGLYCERIN (NITROSTAT) 0.4 MG SL tablet, Place 1 tablet (0.4 mg total) under the tongue every 5 (five) minutes x 3 doses as needed for chest pain., Disp: 25 tablet, Rfl: 1   omeprazole (PRILOSEC) 20 MG capsule, Take 1 capsule by mouth once daily, Disp: 90 capsule, Rfl: 0   Semaglutide,0.25 or 0.5MG /DOS, (OZEMPIC, 0.25 OR 0.5 MG/DOSE,) 2 MG/3ML SOPN, Inject 0.25 mg into the skin every 7 (seven) days for 28 days, THEN 0.5 mg every 7 (seven) days., Disp: 9 mL, Rfl: 0   varenicline (CHANTIX CONTINUING MONTH PAK) 1 MG tablet, Take 1 tablet (1 mg total) by mouth 2 (two) times daily., Disp: 60 tablet, Rfl: 1  Social History   Socioeconomic History   Marital status: Married    Spouse name: Not on file   Number of children: Not on file   Years of education: Not on file   Highest education level: Not on file  Occupational History   Occupation: Full time    Employer: [employer]  Tobacco Use   Smoking status: Former    Types: Cigarettes    Start date: 03/20/2012    Quit date: 02/15/2022    Years since quitting: 0.2   Smokeless tobacco: Never  Vaping Use   Vaping Use: Never used  Substance and Sexual Activity   Alcohol use: Yes    Alcohol/week: 0.0 standard drinks of alcohol    Comment: occ   Drug use: No   Sexual activity: Not on file  Other Topics Concern   Not on file   Social History Narrative   Married   No regular exercise   Social Determinants of Health   Financial Resource Strain: Low Risk  (05/30/2022)   Overall Financial Resource Strain (CARDIA)    Difficulty of Paying Living Expenses: Not very hard  Food Insecurity: No Food Insecurity (05/30/2022)   Hunger Vital Sign    Worried About Running Out of Food in the Last Year: Never true    Ran Out of Food in the Last Year: Never true  Transportation Needs: No Transportation Needs (05/30/2022)   PRAPARE - Administrator, Civil Service (Medical): No    Lack of Transportation (Non-Medical): No  Physical Activity: Unknown (05/30/2022)   Exercise Vital Sign    Days of Exercise per Week: 0 days    Minutes of Exercise per Session: Not on file  Stress: No Stress Concern Present (05/30/2022)   [employer] of Occupational Health - Occupational Stress Questionnaire    Feeling of Stress : Not at all  Social Connections: Moderately Isolated (05/30/2022)   Social Connection and Isolation Panel [NHANES]    Frequency of Communication with Friends and Family: Twice a week    Frequency of Social Gatherings with Friends and Family: Twice a week    Attends Religious Services: Never    Diplomatic Services operational officer: No    Attends Engineer, structural: Not on file    Marital Status: Married  Catering manager Violence: Not on file   Family History  Problem Relation Age of Onset   Heart disease Mother    Colon polyps Father    Coronary artery disease Other    Colon polyps Sister    Colon cancer Neg Hx    Esophageal cancer Neg Hx    Rectal cancer Neg Hx    Stomach cancer Neg Hx     Objective: Office vital signs reviewed. BP 137/88   Pulse 74   Temp 98.7 F (37.1 C)   Ht 6' (1.829 m)   Wt 294 lb (133.4 kg)   SpO2 94%   BMI 39.87 kg/m   Physical Examination:  General: Awake, alert, morbidly obese, No acute distress HEENT: No JVD.  No exophthalmos.  No appreciable  goiter Cardio: regular rate and rhythm, S1S2 heard, no murmurs appreciated Pulm: clear to auscultation bilaterally, no wheezes, rhonchi or rales; normal work of breathing on room air Extremities: warm, well perfused, 2+ pitting edema to mid shins bilaterally, No cyanosis or clubbing; +2 pulses bilaterally.  Negative Homans.    Assessment/ Plan: 62 y.o. male   Bilateral leg edema - Plan: TSH, T4, Free, Brain  natriuretic peptide, Basic Metabolic Panel, CBC, EKG 12-Lead, furosemide (LASIX) 40 MG tablet, Basic metabolic panel, Compression stockings  Controlled type 2 diabetes mellitus with other specified complication, without long-term current use of insulin - Plan: Bayer DCA Hb A1c Waived  CAD S/P percutaneous coronary angioplasty - Plan: Brain natriuretic peptide, EKG 12-Lead  Chronic fatigue - Plan: Vitamin B12, Iron, TIBC and Ferritin Panel, EKG 12-Lead  Suspect this is venous insufficiency in the setting of morbid obesity.  I will collect a BNP given cardiac history.  His pulmonary exam was unremarkable and did not reveal any rales suggest fluid overload from a cardiac standpoint insufficiency in the setting of morbid obesity.  I will collect a BNP given cardiac history.  His pulmonary exam was unremarkable and did not reveal any rales suggest fluid overload from a cardiac standpoint.  I have recommended limiting salt, elevation of lower extremities, activity as tolerated.  Compression stockings for moderate compression ordered and sent to pharmacy.  Will give him Lasix but I discussed with him I only want him using this up to 3 days per fluid overload episode or if he gains more than 3 pounds in 1 day.  Sugar actually now controlled in less than 3 months with A1c back down to 7.0  Check iron panel, vitamin B12, CBC and BMP given reports of chronic fatigue.  Again I think this is likely due to chronic medical issues but will CC cardiology as FYI.  Perhaps he is due for stress  testing/ultrasound?  No orders of the defined types were placed in this encounter.  No orders of the defined types were placed in this encounter.    Scott Ip, DO [facility] [phone]

## 2022-05-31 LAB — BRAIN NATRIURETIC PEPTIDE: BNP: 16.6 pg/mL (ref 0.0–100.0)

## 2022-05-31 LAB — CBC
Hematocrit: 48.1 % (ref 37.5–51.0)
Hemoglobin: 16.3 g/dL (ref 13.0–17.7)
MCH: 30.9 pg (ref 26.6–33.0)
MCHC: 33.9 g/dL (ref 31.5–35.7)
MCV: 91 fL (ref 79–97)
Platelets: 239 10*3/uL (ref 150–450)
RBC: 5.27 x10E6/uL (ref 4.14–5.80)
RDW: 12.6 % (ref 11.6–15.4)
WBC: 6.9 10*3/uL (ref 3.4–10.8)

## 2022-05-31 LAB — TSH: TSH: 3.33 u[IU]/mL (ref 0.450–4.500)

## 2022-05-31 LAB — T4, FREE: Free T4: 1.23 ng/dL (ref 0.82–1.77)

## 2022-05-31 LAB — IRON,TIBC AND FERRITIN PANEL
Ferritin: 1043 ng/mL — ABNORMAL HIGH (ref 30–400)
Iron Saturation: 31 % (ref 15–55)
Iron: 83 ug/dL (ref 38–169)
Total Iron Binding Capacity: 269 ug/dL (ref 250–450)
UIBC: 186 ug/dL (ref 111–343)

## 2022-05-31 LAB — BASIC METABOLIC PANEL
BUN/Creatinine Ratio: 12 (ref 10–24)
BUN: 11 mg/dL (ref 8–27)
CO2: 24 mmol/L (ref 20–29)
Calcium: 9.3 mg/dL (ref 8.6–10.2)
Chloride: 101 mmol/L (ref 96–106)
Creatinine, Ser: 0.94 mg/dL (ref 0.76–1.27)
Glucose: 109 mg/dL — ABNORMAL HIGH (ref 70–99)
Potassium: 3.8 mmol/L (ref 3.5–5.2)
Sodium: 141 mmol/L (ref 134–144)
eGFR: 92 mL/min/{1.73_m2} (ref >59)

## 2022-05-31 LAB — VITAMIN B12: Vitamin B-12: 447 pg/mL (ref 232–1245)

## 2022-06-24 NOTE — Progress Notes (Unsigned)
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 Copd  p developing cough and sob the month of Dec 2023 > resolved    History of Present Illness  03/23/2022  Pulmonary/ 1st office eval/ Scott Hatfield / [place] [facility] off telegy x 2 weeks / wt 287  Chief Complaint  Patient presents with   Consult     Onset of sob was 01/2022.  Rx Trelegy and albuterol as needed he states that he can't tel the difference.   Does have SOB, cough and wheeze  Dyspnea:  MMRC1 = can walk nl pace, flat grade, can't hurry or go uphills or steps s sob   Cough: none  Sleep: cpap sleeps fine  SABA use: none  02: none  Lung cancer screen: planned  Rec My office will be contacting you by phone for referral to lung cancer screening program   Breztri 1-2  puffs every 12 hours as needed until you have your pfts  Only use your albuterol as a rescue medication  Also Ok to try albuterol 15 min before an activity (on alternating days)  that you know would usually make you short of breath     06/26/2022  f/u ov/Scott Hatfield re: GOLD 0   maint on trelegy  - no change vs breztri vs nothing  Chief Complaint  Patient presents with   Follow-up    Review PFT.  SOB with exertion.  Dyspnea:  does not do well digging or walking in heat  No change on trelegy = MMRC1 = can walk nl pace, flat grade, can't hurry or go uphills or steps s sob   Cough: none  Sleeping: cpap flat bed does fine  SABA use: not using at all / never pre- challenges  02: none     No obvious day to day or daytime variability or assoc excess/ purulent sputum or mucus plugs or hemoptysis or cp or chest tightness, subjective wheeze or overt sinus or hb symptoms.    Also denies any obvious fluctuation of symptoms with weather or environmental changes or other aggravating or alleviating factors except as outlined  above   No unusual exposure hx or h/o childhood pna/ asthma or knowledge of premature birth.  Current Allergies, Complete Past Medical History, Past Surgical History, Family History, and Social History were reviewed in [employer] record.  ROS  The following are not active complaints unless bolded Hoarseness, sore throat, dysphagia, dental problems, itching, sneezing,  nasal congestion or discharge of excess mucus or purulent secretions, ear ache,   fever, chills, sweats, unintended wt loss or wt gain, classically pleuritic or exertional cp,  orthopnea pnd or arm/hand swelling  or leg swelling, presyncope, palpitations, abdominal pain, anorexia, nausea, vomiting, diarrhea  or change in bowel habits or change in bladder habits, change in stools or change in urine, dysuria, hematuria,  rash, arthralgias, visual complaints, headache, numbness, weakness or ataxia or problems with walking or coordination,  change in mood or  memory.        Current Meds  Medication Sig   albuterol (VENTOLIN HFA) 108 (90 Base) MCG/ACT inhaler INHALE 2 PUFFS INTO LUNGS EVERY 6 HOURS AS NEEDED FOR WHEEZING OR SHORTNESS OF BREATH   amLODipine (NORVASC) 10 MG tablet Take 1 tablet (10 mg total) by mouth daily.  aspirin 81 MG chewable tablet Chew 81 mg by mouth daily.   atorvastatin (LIPITOR) 80 MG tablet Take 1 tablet (80 mg total) by mouth daily.   buPROPion (WELLBUTRIN XL) 150 MG 24 hr tablet Take 1 tablet (150 mg total) by mouth every morning.   Fluticasone-Umeclidin-Vilant (TRELEGY ELLIPTA) 200-62.5-25 MCG/ACT AEPB Inhale 1 puff into the lungs daily as needed (if shortness of breath).   furosemide (LASIX) 40 MG tablet Take 1 tablet (40 mg total) by mouth daily as needed for edema (or 3lb weight gain in 24 hours.).   hydrochlorothiazide (HYDRODIURIL) 25 MG tablet Take 1 tablet (25 mg total) by mouth daily.   lisinopril (ZESTRIL) 40 MG tablet TAKE 1 TABLET BY MOUTH ONCE DAILY .   metoprolol  tartrate (LOPRESSOR) 50 MG tablet Take 1 tablet (50 mg total) by mouth 2 (two) times daily.   nitroGLYCERIN (NITROSTAT) 0.4 MG SL tablet Place 1 tablet (0.4 mg total) under the tongue every 5 (five) minutes x 3 doses as needed for chest pain.   omeprazole (PRILOSEC) 20 MG capsule Take 1 capsule by mouth once daily            Past Medical History:  Diagnosis Date   Allergy    occasionally    CAD in native artery    a. 2008 - Single-vessel CAD with stent placement to the circumflex  b. 2012 - DES to Diag.  c. 06/2014 - RCA 25% stenosis, LAD 30% stenosis, ramus intermediate 45% stenosis, D1 95% stenosis treated with DES  d.01/2015 - nonobstructive CAD   GERD (gastroesophageal reflux disease)    Hyperlipidemia, mixed    Hypertension    Myocardial infarction (HCC) 2008   Overweight(278.02)    Sleep apnea    CPAP       Objective:    Wt Readings from Last 3 Encounters:  06/26/22 289 lb 3.2 oz (131.2 kg)  05/30/22 294 lb (133.4 kg)  03/27/22 294 lb (133.4 kg)    Vital signs reviewed  06/26/2022  - Note at rest 02 sats  95% on RA   General appearance:    obese pleasant wm nad      HEENT : Oropharynx  clear      Nasal turbinates nl    NECK :  without  apparent JVD/ palpable Nodes/TM    LUNGS: no acc muscle use,  Nl contour chest which is clear to A and P bilaterally without cough on insp or exp maneuvers   CV:  RRR  no s3 or murmur or increase in P2, and no edema   ABD:  quite obese but soft and nontender    MS:  Nl gait/ ext warm without deformities Or obvious joint restrictions  calf tenderness, cyanosis or clubbing    SKIN: warm and dry without lesions    NEURO:  alert, approp, nl sensorium with  no motor or cerebellar deficits apparent.     I personally reviewed images and agree with radiology impression as follows:   Chest CT w contrast    01/23/22  1. Band of atelectasis in the medial RIGHT upper lobe. Unclear etiology. No obstructing lesion identified.   2. No  mediastinal lymphadenopathy.      Assessment

## 2022-06-26 ENCOUNTER — Ambulatory Visit: Payer: Commercial Managed Care - PPO | Admitting: Internal Medicine

## 2022-06-26 ENCOUNTER — Encounter: Payer: Self-pay | Admitting: Internal Medicine

## 2022-06-26 ENCOUNTER — Ambulatory Visit (INDEPENDENT_AMBULATORY_CARE_PROVIDER_SITE_OTHER): Payer: Commercial Managed Care - PPO | Admitting: Internal Medicine

## 2022-06-26 VITALS — BP 136/78 | HR 80 | Temp 97.8°F | Ht 72.0 in | Wt 289.2 lb

## 2022-06-26 DIAGNOSIS — E1159 Type 2 diabetes mellitus with other circulatory complications: Secondary | ICD-10-CM

## 2022-06-26 DIAGNOSIS — R0609 Other forms of dyspnea: Secondary | ICD-10-CM

## 2022-06-26 DIAGNOSIS — I152 Hypertension secondary to endocrine disorders: Secondary | ICD-10-CM | POA: Diagnosis not present

## 2022-06-26 DIAGNOSIS — Z87891 Personal history of nicotine dependence: Secondary | ICD-10-CM | POA: Diagnosis not present

## 2022-06-26 DIAGNOSIS — J4521 Mild intermittent asthma with (acute) exacerbation: Secondary | ICD-10-CM

## 2022-06-26 LAB — PULMONARY FUNCTION TEST
DL/VA % pred: 122 %
DL/VA: 5.13 ml/min/mmHg/L
DLCO cor % pred: 100 %
DLCO cor: 29.34 ml/min/mmHg
DLCO unc % pred: 100 %
DLCO unc: 29.34 ml/min/mmHg
FEF 25-75 Post: 2.86 L/sec
FEF 25-75 Pre: 2.24 L/sec
FEF2575-%Change-Post: 27 %
FEF2575-%Pred-Post: 91 %
FEF2575-%Pred-Pre: 72 %
FEV1-%Change-Post: 5 %
FEV1-%Pred-Post: 74 %
FEV1-%Pred-Pre: 69 %
FEV1-Post: 2.84 L
FEV1-Pre: 2.68 L
FEV1FVC-%Change-Post: 1 %
FEV1FVC-%Pred-Pre: 102 %
FEV6-%Change-Post: 4 %
FEV6-%Pred-Post: 73 %
FEV6-%Pred-Pre: 70 %
FEV6-Post: 3.57 L
FEV6-Pre: 3.41 L
FEV6FVC-%Change-Post: 0 %
FEV6FVC-%Pred-Post: 104 %
FEV6FVC-%Pred-Pre: 103 %
FVC-%Change-Post: 3 %
FVC-%Pred-Post: 70 %
FVC-%Pred-Pre: 68 %
FVC-Post: 3.59 L
FVC-Pre: 3.46 L
Post FEV1/FVC ratio: 79 %
Post FEV6/FVC ratio: 99 %
Pre FEV1/FVC ratio: 77 %
Pre FEV6/FVC Ratio: 99 %
RV % pred: 141 %
RV: 3.35 L
TLC % pred: 94 %
TLC: 7.02 L

## 2022-06-26 MED ORDER — OLMESARTAN MEDOXOMIL 40 MG PO TABS: 40.0000 mg | ORAL_TABLET | Freq: Every day | ORAL | 3 refills | Status: DC

## 2022-06-26 NOTE — Patient Instructions (Signed)
Stop lisinopril and start olmesartan 40 mg one daily in its place   Also  Ok to try albuterol 15 min before an activity (on alternating days)  that you know would usually make you short of breath and see if it makes any difference and if makes none then don't take albuterol after activity unless you can't catch your breath as this means it's the resting that helps, not the albuterol.      To get the most out of exercise, you need to be continuously aware that you are short of breath, but never out of breath, for at least 30 minutes daily. As you improve, it will actually be easier for you to do the same amount of exercise  in  30 minutes so always push to the level where you are short of breath.     Make sure you check your oxygen saturations at highest level of activity once a month and let me know if you are losing ground with your activity tolerance or oxygen saturations   Pulmonary follow up is as needed

## 2022-06-26 NOTE — Progress Notes (Signed)
Full PFT completed today ? ?

## 2022-06-27 ENCOUNTER — Ambulatory Visit: Payer: Commercial Managed Care - PPO | Admitting: Family Medicine

## 2022-06-27 ENCOUNTER — Encounter: Payer: Self-pay | Admitting: Internal Medicine

## 2022-06-27 NOTE — Assessment & Plan Note (Signed)
Flared dec 2023 > quit smoking 02/2022 and stopped all inhalers late Jan 2024 s recurrence  - 03/23/2022  After extensive coaching inhaler device,  effectiveness =    60%(short ti)  - PFT's  06/26/2022   FEV1 2.84 (74 % ) ratio 0.79  p 5 % improvement from saba p nothing prior to study with   FV curve min concavity to exp curve  and ERV 10% at wt 289     He does not have any significant copd to explain poor ex tol but rather impressive reduciton in ERV typical of body habitus.  He could be prone to asthma flares with URI's but now that he's quit smoking he has no need for saba at all.  Therefore main focus now is on reconditioning/ wt loss/ with pulmonary f/u prn          Each maintenance medication was reviewed in detail including emphasizing most importantly the difference between maintenance and prns and under what circumstances the prns are to be triggered using an action plan format where appropriate.  Total time for H and P, chart review, counseling, reviewing hfa  device(s) and generating customized AVS unique to this office visit / same day charting > 30 min final summary f/u ov

## 2022-06-27 NOTE — Assessment & Plan Note (Addendum)
Stopped smoking Jan 2024  - CT chest 01/23/22  in setting of AB flare with small linear area of atx along R mediastinum likely a small mucus plug so 03/23/2022 rec just start LDSCT yearly as eligible thru 2039  Defer to PCP as part of annual eval unless otherwise requested thru this office as no further pulmonary f/u planned / explained to pt 06/26/2022

## 2022-06-27 NOTE — Assessment & Plan Note (Signed)
Trial of ARB instead of ACEi for unexplained DOE   ACE inhibitors are problematic in  pts with may different  airway complaints because  even experienced pulmonologists can't distinguish ace effects from copd/asthma.  By themselves they don't actually cause a problem, much like oxygen can't by itself start a fire, but they certainly serve as a powerful catalyst or enhancer for any "fire"  or inflammatory process in the upper airway, be it caused by an ET  tube or more commonly reflux (especially in the obese or pts with known GERD or who are on biphoshonates).    In the era of ARB near equivalency would try Benicar 40 mg daily x one month min and if no discernable change in DOE then fine to resume the ACEi.  F/u per PCP planned w/in next 3-4 m

## 2022-07-03 ENCOUNTER — Other Ambulatory Visit (HOSPITAL_COMMUNITY): Payer: Self-pay

## 2022-07-08 ENCOUNTER — Other Ambulatory Visit: Payer: Self-pay | Admitting: Family Medicine

## 2022-07-08 DIAGNOSIS — E1165 Type 2 diabetes mellitus with hyperglycemia: Secondary | ICD-10-CM

## 2022-07-23 ENCOUNTER — Other Ambulatory Visit: Payer: Self-pay | Admitting: Family Medicine

## 2022-07-23 DIAGNOSIS — E1165 Type 2 diabetes mellitus with hyperglycemia: Secondary | ICD-10-CM

## 2022-07-28 ENCOUNTER — Other Ambulatory Visit (HOSPITAL_COMMUNITY): Payer: Self-pay

## 2022-07-30 ENCOUNTER — Other Ambulatory Visit: Payer: Self-pay | Admitting: Family Medicine

## 2022-07-30 DIAGNOSIS — I10 Essential (primary) hypertension: Secondary | ICD-10-CM

## 2022-07-30 DIAGNOSIS — I2510 Atherosclerotic heart disease of native coronary artery without angina pectoris: Secondary | ICD-10-CM

## 2022-08-15 ENCOUNTER — Other Ambulatory Visit: Payer: Self-pay | Admitting: Family Medicine

## 2022-08-15 DIAGNOSIS — K209 Esophagitis, unspecified without bleeding: Secondary | ICD-10-CM

## 2022-09-14 ENCOUNTER — Other Ambulatory Visit: Payer: Self-pay | Admitting: Family Medicine

## 2022-09-14 DIAGNOSIS — K209 Esophagitis, unspecified without bleeding: Secondary | ICD-10-CM

## 2022-09-15 ENCOUNTER — Other Ambulatory Visit: Payer: Self-pay | Admitting: Family Medicine

## 2022-09-15 DIAGNOSIS — R600 Localized edema: Secondary | ICD-10-CM

## 2022-09-20 ENCOUNTER — Other Ambulatory Visit (HOSPITAL_COMMUNITY): Payer: Self-pay

## 2022-09-21 ENCOUNTER — Other Ambulatory Visit (HOSPITAL_COMMUNITY): Payer: Self-pay

## 2022-09-21 ENCOUNTER — Telehealth: Payer: Self-pay | Admitting: Pharmacy Technician

## 2022-09-21 NOTE — Telephone Encounter (Signed)
Pharmacy Patient Advocate Encounter  Received notification from [employer] that Prior Authorization for Ozempic (0.25 or 0.5 MG/DOSE) 2MG /3ML pen-injectors has been CANCELLED due to REFILL too soon. Billed on 08/27/22 and next available fill per insurance is 09/28/22   PA #/Case ID/Reference #: [id number]

## 2022-10-10 ENCOUNTER — Encounter: Payer: Self-pay | Admitting: Family Medicine

## 2022-10-10 ENCOUNTER — Ambulatory Visit: Payer: Commercial Managed Care - PPO | Admitting: Family Medicine

## 2022-10-10 VITALS — BP 138/76 | HR 76 | Temp 98.8°F | Ht 72.0 in | Wt 289.4 lb

## 2022-10-10 DIAGNOSIS — I2510 Atherosclerotic heart disease of native coronary artery without angina pectoris: Secondary | ICD-10-CM

## 2022-10-10 DIAGNOSIS — L918 Other hypertrophic disorders of the skin: Secondary | ICD-10-CM

## 2022-10-10 DIAGNOSIS — E1169 Type 2 diabetes mellitus with other specified complication: Secondary | ICD-10-CM

## 2022-10-10 DIAGNOSIS — Z7985 Long-term (current) use of injectable non-insulin antidiabetic drugs: Secondary | ICD-10-CM | POA: Diagnosis not present

## 2022-10-10 DIAGNOSIS — Z9861 Coronary angioplasty status: Secondary | ICD-10-CM

## 2022-10-10 LAB — BAYER DCA HB A1C WAIVED: HB A1C (BAYER DCA - WAIVED): 6.6 % — ABNORMAL HIGH (ref 4.8–5.6)

## 2022-10-10 MED ORDER — SEMAGLUTIDE (1 MG/DOSE) 4 MG/3ML ~~LOC~~ SOPN
1.0000 mg | PEN_INJECTOR | SUBCUTANEOUS | 3 refills | Status: DC
Start: 2022-10-10 — End: 2022-12-05

## 2022-10-10 NOTE — Patient Instructions (Signed)
Skin Tag Removal Wound Care instructions  . Sometimes bandages are not necessary.  If a bandage is used, leave the original bandage on for 24 hours if possible.  If the bandage becomes soaked or soiled before that time, it is OK to remove it and examine the wound.  A small amount of post-operative bleeding is normal.  If excessive bleeding occurs, remove the bandage, place gauze over the site and apply continuous pressure (no peeking) over the area for 20-30 minutes.  If this does not stop the bleeding, try again for 40 minutes.  If this does not work, please call our clinic as soon as possible (even if after hours).    . Once a day, cleanse the wound with soap and water.  If a thick crust develops you may use a Q-tip dipped into dilute hydrogen peroxide (mix 1:1 with water) to dissolve it.  Hydrogen peroxide can slow the healing process, so use it only as needed.  After washing, apply Vaseline jelly or Polysporin ointment.  For best healing, the wound should be covered with a layer of ointment at all times.  This may mean re-applying the ointment several times a day.  For open wounds, continue until it has healed.    . If you have any swelling, keep the area elevated.  . Some redness, tenderness and white or yellow material in the wound is normal healing.  If the area becomes very sore and red, or develops a thick yellow-green material (pus), it may be infected; please notify us.    . Wound healing continues for up to one year following surgery.  It is not unusual to experience pain in the scar from time to time during the interval.  If the pain becomes severe or the scar thickens, you should notify the office.  A slight amount of redness in a scar is expected for the first six months.  After six months, the redness subsides and the scar will soften and fade.  The color difference becomes less noticeable with time.  If there are any problems, return for a post-op surgery check at your earliest  convenience.  . It is important to wear sunscreen daily with an SPF of 30 or higher over the treated sites and to wear sun-protective clothing.  . Please call our office for any questions or concerns.  

## 2022-10-10 NOTE — Progress Notes (Signed)
Subjective: CC: Skin tags PCP: Raliegh Ip, DO Scott Hatfield is a 62 y.o. male presenting to clinic today for:  1.  Multiple inflamed skin tags Patient reports that he has multiple inflamed skin tags underneath the right axilla and in the left groin.  He notes that these get caught on his clothing and sometimes bleed and get inflamed.  He will like to have these removed  2.  Type 2 diabetes with hypertension, hyperlipidemia and CAD Patient reports compliance with medications including Ozempic 0.5 mg.  Blood sugars have been looking okay with it but he still has struggled with weight.  Would like to advance the dose if possible.  No reports of chest pain, nausea, vomiting or abdominal pain.  Sometimes experiences shortness of breath with activity   ROS: Per HPI  Allergies  Allergen Reactions   Metformin And Related Nausea Only   Past Medical History:  Diagnosis Date   Allergy    occasionally    CAD in native artery    a. 2008 - Single-vessel CAD with stent placement to the circumflex  b. 2012 - DES to Diag.  c. 06/2014 - RCA 25% stenosis, LAD 30% stenosis, ramus intermediate 45% stenosis, D1 95% stenosis treated with DES  d.01/2015 - nonobstructive CAD   GERD (gastroesophageal reflux disease)    Hyperlipidemia, mixed    Hypertension    Myocardial infarction (HCC) 2008   Overweight(278.02)    Sleep apnea    CPAP    Current Outpatient Medications:    albuterol (VENTOLIN HFA) 108 (90 Base) MCG/ACT inhaler, INHALE 2 PUFFS INTO LUNGS EVERY 6 HOURS AS NEEDED FOR WHEEZING OR SHORTNESS OF BREATH, Disp: 18 g, Rfl: 0   amLODipine (NORVASC) 10 MG tablet, Take 1 tablet (10 mg total) by mouth daily., Disp: 90 tablet, Rfl: 3   aspirin 81 MG chewable tablet, Chew 81 mg by mouth daily., Disp: , Rfl:    atorvastatin (LIPITOR) 80 MG tablet, Take 1 tablet (80 mg total) by mouth daily., Disp: 90 tablet, Rfl: 3   buPROPion (WELLBUTRIN XL) 150 MG 24 hr tablet, Take 1 tablet (150 mg  total) by mouth every morning., Disp: 90 tablet, Rfl: 3   furosemide (LASIX) 40 MG tablet, TAKE 1 TABLET BY MOUTH ONCE DAILY AS NEEDED FOR EDEMA OR 3 LBS WEIGHT GAIN IN 24 HOURS, Disp: 30 tablet, Rfl: 0   hydrochlorothiazide (HYDRODIURIL) 25 MG tablet, Take 1 tablet (25 mg total) by mouth daily., Disp: 90 tablet, Rfl: 3   metoprolol tartrate (LOPRESSOR) 50 MG tablet, Take 1 tablet by mouth twice daily, Disp: 180 tablet, Rfl: 0   nitroGLYCERIN (NITROSTAT) 0.4 MG SL tablet, Place 1 tablet (0.4 mg total) under the tongue every 5 (five) minutes x 3 doses as needed for chest pain., Disp: 25 tablet, Rfl: 1   olmesartan (BENICAR) 40 MG tablet, Take 1 tablet (40 mg total) by mouth daily., Disp: 30 tablet, Rfl: 3   omeprazole (PRILOSEC) 20 MG capsule, Take 1 capsule (20 mg total) by mouth daily., Disp: 30 capsule, Rfl: 0   Semaglutide,0.25 or 0.5MG /DOS, (OZEMPIC, 0.25 OR 0.5 MG/DOSE,) 2 MG/3ML SOPN, Inject 0.5 mg into the skin once a week., Disp: 9 mL, Rfl: 0 Social History   Socioeconomic History   Marital status: Married    Spouse name: Not on file   Number of children: Not on file   Years of education: Not on file   Highest education level: Not on file  Occupational History  Occupation: Full time    Employer: [employer]  Tobacco Use   Smoking status: Former    Current packs/day: 0.00    Types: Cigarettes    Start date: 03/20/2012    Quit date: 02/15/2022    Years since quitting: 0.6   Smokeless tobacco: Never  Vaping Use   Vaping status: Never Used  Substance and Sexual Activity   Alcohol use: Yes    Alcohol/week: 0.0 standard drinks of alcohol    Comment: occ   Drug use: No   Sexual activity: Not on file  Other Topics Concern   Not on file  Social History Narrative   Married   No regular exercise   Social Determinants of Health   Financial Resource Strain: Low Risk  (05/30/2022)   Overall Financial Resource Strain (CARDIA)    Difficulty of Paying Living Expenses: Not very  hard  Food Insecurity: No Food Insecurity (05/30/2022)   Hunger Vital Sign    Worried About Running Out of Food in the Last Year: Never true    Ran Out of Food in the Last Year: Never true  Transportation Needs: No Transportation Needs (05/30/2022)   PRAPARE - Administrator, Civil Service (Medical): No    Lack of Transportation (Non-Medical): No  Physical Activity: Unknown (05/30/2022)   Exercise Vital Sign    Days of Exercise per Week: 0 days    Minutes of Exercise per Session: Not on file  Stress: No Stress Concern Present (05/30/2022)   [employer] of Occupational Health - Occupational Stress Questionnaire    Feeling of Stress : Not at all  Social Connections: Moderately Isolated (05/30/2022)   Social Connection and Isolation Panel [NHANES]    Frequency of Communication with Friends and Family: Twice a week    Frequency of Social Gatherings with Friends and Family: Twice a week    Attends Religious Services: Never    Database administrator or Organizations: No    Attends Engineer, structural: Not on file    Marital Status: Married  Intimate Partner Violence: Unknown (05/20/2021)   Received from [facility]   HITS    Physically Hurt: Not on file    Insult or Talk Down To: Not on file    Threaten Physical Harm: Not on file    Scream or Curse: Not on file   Family History  Problem Relation Age of Onset   Heart disease Mother    Colon polyps Father    Coronary artery disease Other    Colon polyps Sister    Colon cancer Neg Hx    Esophageal cancer Neg Hx    Rectal cancer Neg Hx    Stomach cancer Neg Hx     Objective: Office vital signs reviewed. There were no vitals taken for this visit.  Physical Examination:  General: Awake, alert, morbidly obese, No acute distress Cardio: Regular rate and rhythm Pulmonary: normal WOB on room air Skin: 2 skin tags noted under the right axilla, 3 along the left inner thigh/groin area and 1 along the left side  of the neck near the clavicle.  No active bleeding or erythema  PROCEDURE NOTE: Skin tag removal Patient given informed consent, signed copy in the chart.  Appropriate time out taken. Areas of concern cleansed with alcohol swabs.  Areas were anesthetized with ethyl chloride spray.   Once anaesthesia obtained, skin tags were removed using sterile scissors.  6 skin tags were removed in total (2 from  right axilla, 3 from left groin, 1 from left neck).  The patient tolerated the procedure well.  Minimal bleeding.  Patient given post procedure instructions.   Assessment/ Plan: 62 y.o. male   Multiple acquired skin tags  Controlled type 2 diabetes mellitus with other specified complication, without long-term current use of insulin (HCC) - Plan: Basic Metabolic Panel, Bayer DCA Hb A1c Waived, Semaglutide, 1 MG/DOSE, 4 MG/3ML SOPN  CAD S/P percutaneous coronary angioplasty - Plan: Semaglutide, 1 MG/DOSE, 4 MG/3ML SOPN  Long-term current use of injectable noninsulin antidiabetic medication  Removed with sterile scissors.  No immediate complications.  Check A1c.  Ozempic advanced in the setting of cardiovascular disease, morbid obesity.  Not yet meeting clinical goals.  Would like to see him back in 3 months for recheck, sooner if concerns arise  Raliegh Ip, DO [facility] [phone]

## 2022-10-11 LAB — BASIC METABOLIC PANEL
BUN/Creatinine Ratio: 11 (ref 10–24)
BUN: 11 mg/dL (ref 8–27)
CO2: 22 mmol/L (ref 20–29)
Calcium: 9.3 mg/dL (ref 8.6–10.2)
Chloride: 101 mmol/L (ref 96–106)
Creatinine, Ser: 0.98 mg/dL (ref 0.76–1.27)
Glucose: 119 mg/dL — ABNORMAL HIGH (ref 70–99)
Potassium: 4.1 mmol/L (ref 3.5–5.2)
Sodium: 139 mmol/L (ref 134–144)
eGFR: 87 mL/min/{1.73_m2} (ref >59)

## 2022-10-12 ENCOUNTER — Other Ambulatory Visit: Payer: Self-pay | Admitting: Internal Medicine

## 2022-10-13 ENCOUNTER — Telehealth: Payer: Self-pay

## 2022-10-13 ENCOUNTER — Other Ambulatory Visit (HOSPITAL_COMMUNITY): Payer: Self-pay

## 2022-10-13 NOTE — Telephone Encounter (Signed)
*  Primary  Pharmacy Patient Advocate Encounter   Received notification from CoverMyMeds that prior authorization for OZEMPIC 1 MG/DOSE (4 MG/3 ML)  is required/requested.   Insurance verification completed.   The patient is insured through [employer] .   Per test claim: PA required; PA submitted to [employer] via Prompt PA Key/confirmation #/EOC [id number] Status is pending

## 2022-10-20 LAB — HM DIABETES EYE EXAM

## 2022-10-24 ENCOUNTER — Other Ambulatory Visit (HOSPITAL_COMMUNITY): Payer: Self-pay

## 2022-10-25 ENCOUNTER — Other Ambulatory Visit (HOSPITAL_COMMUNITY): Payer: Self-pay

## 2022-10-25 NOTE — Telephone Encounter (Signed)
Pharmacy Patient Advocate Encounter  Received notification from [employer] that Prior Authorization for Ozempic (1 MG/DOSE) 4MG /3ML pen-injectors has been CANCELLED due to This request has been cancelled as this is a duplicate request. This medication has previously had an outcome determination, the member has a prior authorization already in process or under review, OR the member has a prior authorization on file. Please contact [employer] at [phone] if additional information is needed.  PA #/Case ID/Reference #: [id number]

## 2022-10-25 NOTE — Telephone Encounter (Signed)
Called [employer] to check status of PA. Per representative, PA was previously approved in February until further notice. Patient will have to call [employer] at [phone] to enroll in the assistance program in order to get Ozempic.

## 2022-11-26 ENCOUNTER — Other Ambulatory Visit: Payer: Self-pay | Admitting: Family Medicine

## 2022-11-26 DIAGNOSIS — R600 Localized edema: Secondary | ICD-10-CM

## 2022-11-29 ENCOUNTER — Telehealth: Payer: Self-pay | Admitting: Family Medicine

## 2022-11-29 ENCOUNTER — Other Ambulatory Visit (HOSPITAL_COMMUNITY): Payer: Self-pay

## 2022-11-29 NOTE — Telephone Encounter (Signed)
Patient aware and verbalizes understanding. 

## 2022-11-29 NOTE — Telephone Encounter (Signed)
Please see previous encounter. Patient has an active approved prior authorization for the Ozempic.   Patient will need to call [phone] to complete enrollment into program for Ozempic.

## 2022-12-05 ENCOUNTER — Telehealth: Payer: Self-pay | Admitting: Pharmacist

## 2022-12-05 DIAGNOSIS — E1169 Type 2 diabetes mellitus with other specified complication: Secondary | ICD-10-CM

## 2022-12-05 DIAGNOSIS — I2510 Atherosclerotic heart disease of native coronary artery without angina pectoris: Secondary | ICD-10-CM

## 2022-12-05 MED ORDER — SEMAGLUTIDE (1 MG/DOSE) 4 MG/3ML ~~LOC~~ SOPN: 1.0000 mg | PEN_INJECTOR | SUBCUTANEOUS | 3 refills | Status: DC

## 2022-12-05 NOTE — Telephone Encounter (Signed)
OZEMPIC PA APPROVED Ozempic 3 month supply sent to [facility] specialty mail order per patient request

## 2022-12-15 ENCOUNTER — Ambulatory Visit (INDEPENDENT_AMBULATORY_CARE_PROVIDER_SITE_OTHER): Payer: Commercial Managed Care - PPO

## 2022-12-15 ENCOUNTER — Ambulatory Visit: Payer: Commercial Managed Care - PPO | Admitting: Family Medicine

## 2022-12-15 VITALS — BP 138/79 | HR 82 | Temp 98.9°F | Ht 72.0 in | Wt 280.0 lb

## 2022-12-15 DIAGNOSIS — R051 Acute cough: Secondary | ICD-10-CM

## 2022-12-15 DIAGNOSIS — R062 Wheezing: Secondary | ICD-10-CM | POA: Diagnosis not present

## 2022-12-15 DIAGNOSIS — R0989 Other specified symptoms and signs involving the circulatory and respiratory systems: Secondary | ICD-10-CM | POA: Diagnosis not present

## 2022-12-15 MED ORDER — AZITHROMYCIN 250 MG PO TABS
ORAL_TABLET | ORAL | 0 refills | Status: AC
Start: 2022-12-15 — End: 2022-12-20

## 2022-12-15 MED ORDER — AMOXICILLIN-POT CLAVULANATE 875-125 MG PO TABS
1.0000 | ORAL_TABLET | Freq: Two times a day (BID) | ORAL | 0 refills | Status: DC
Start: 2022-12-15 — End: 2023-05-28

## 2022-12-15 NOTE — Progress Notes (Signed)
Subjective:  Patient ID: Scott Hatfield, male    DOB: 01/29/1961, 62 y.o.   MRN: [id number]  Patient Care Team: Raliegh Ip, DO as PCP - General (Family Medicine) Nyoka Cowden, MD as Consulting Physician (Pulmonary Disease) Rollene Rotunda, MD as Consulting Physician (Cardiology)   Chief Complaint:  head/chest congestion (X 2 weeks)  HPI: Scott Hatfield is a 62 y.o. male presenting on 12/15/2022 for head/chest congestion (X 2 weeks) Reports head and chest congestion, rhinorrhea, cough, lightheadedness. Productive cough, light yellow. Taking mucinex, advil cold and sinus. States that he has felt he was getting better a few times and then got worse. States that it is worse overnight.  States that it started almost 3 weeks ago.  Denies COPD history. Former smoker, quit 02/14/22. Has not been using albuterol inhaler.  States that these symptoms are different from when he was seen in January for possible COPD flare. He was evaluated by pulmonology and determined to not have COPD.   Relevant past medical, surgical, family, and social history reviewed and updated as indicated.  Allergies and medications reviewed and updated. Data reviewed: Chart in Epic.  Past Medical History:  Diagnosis Date   Allergy    occasionally    CAD in native artery    a. 2008 - Single-vessel CAD with stent placement to the circumflex  b. 2012 - DES to Diag.  c. 06/2014 - RCA 25% stenosis, LAD 30% stenosis, ramus intermediate 45% stenosis, D1 95% stenosis treated with DES  d.01/2015 - nonobstructive CAD   GERD (gastroesophageal reflux disease)    Hyperlipidemia, mixed    Hypertension    Myocardial infarction (HCC) 2008   Overweight(278.02)    Sleep apnea    CPAP    Past Surgical History:  Procedure Laterality Date   CARDIAC CATHETERIZATION N/A 07/03/2014   Procedure: Left Heart Cath and Coronary Angiography;  Surgeon: Peter M Swaziland, MD;  Location: [facility] INVASIVE CV LAB;  Service: Cardiovascular;   Laterality: N/A;   CARDIAC CATHETERIZATION N/A 07/03/2014   Procedure: Coronary Stent Intervention;  Surgeon: Peter M Swaziland, MD;  Location: [facility] INVASIVE CV LAB;  Service: Cardiovascular;  Laterality: N/A;   CARDIAC CATHETERIZATION N/A 01/26/2015   Procedure: Left Heart Cath and Coronary Angiography;  Surgeon: Peter M Swaziland, MD;  Location: [facility] INVASIVE CV LAB;  Service: Cardiovascular;  Laterality: N/A;   CORONARY ANGIOPLASTY     2016     3 stents   CORONARY STENT PLACEMENT  2008, 2012   MASS EXCISION N/A 10/24/2019   Procedure: EXCISION; CYST; RIGHT THIGH; PERINEUM;  Surgeon: Lucretia Roers, MD;  Location: [facility];  Service: General;  Laterality: N/A;   wrist  surgery      Social History   Socioeconomic History   Marital status: Married    Spouse name: Not on file   Number of children: Not on file   Years of education: Not on file   Highest education level: 11th grade  Occupational History   Occupation: Full time    Employer: [employer]  Tobacco Use   Smoking status: Former    Current packs/day: 0.00    Types: Cigarettes    Start date: 03/20/2012    Quit date: 02/15/2022    Years since quitting: 0.8   Smokeless tobacco: Never  Vaping Use   Vaping status: Never Used  Substance and Sexual Activity   Alcohol use: Yes    Alcohol/week: 0.0 standard drinks of alcohol  Comment: occ   Drug use: No   Sexual activity: Not on file  Other Topics Concern   Not on file  Social History Narrative   Married   No regular exercise   Social Determinants of Health   Financial Resource Strain: Low Risk  (12/15/2022)   Overall Financial Resource Strain (CARDIA)    Difficulty of Paying Living Expenses: Not hard at all  Food Insecurity: No Food Insecurity (12/15/2022)   Hunger Vital Sign    Worried About Running Out of Food in the Last Year: Never true    Ran Out of Food in the Last Year: Never true  Transportation Needs: No Transportation Needs (12/15/2022)   PRAPARE -  Administrator, Civil Service (Medical): No    Lack of Transportation (Non-Medical): No  Physical Activity: Unknown (12/15/2022)   Exercise Vital Sign    Days of Exercise per Week: Patient declined    Minutes of Exercise per Session: Not on file  Stress: No Stress Concern Present (12/15/2022)   [employer] of Occupational Health - Occupational Stress Questionnaire    Feeling of Stress : Not at all  Social Connections: Moderately Isolated (12/15/2022)   Social Connection and Isolation Panel [NHANES]    Frequency of Communication with Friends and Family: Three times a week    Frequency of Social Gatherings with Friends and Family: Three times a week    Attends Religious Services: Never    Active Member of Clubs or Organizations: No    Attends Banker Meetings: Not on file    Marital Status: Married  Intimate Partner Violence: Unknown (05/20/2021)   Received from [employer], [facility]   HITS    Physically Hurt: Not on file    Insult or Talk Down To: Not on file    Threaten Physical Harm: Not on file    Scream or Curse: Not on file    Outpatient Encounter Medications as of 12/15/2022  Medication Sig   albuterol (VENTOLIN HFA) 108 (90 Base) MCG/ACT inhaler INHALE 2 PUFFS INTO LUNGS EVERY 6 HOURS AS NEEDED FOR WHEEZING OR SHORTNESS OF BREATH   amLODipine (NORVASC) 10 MG tablet Take 1 tablet (10 mg total) by mouth daily.   aspirin 81 MG chewable tablet Chew 81 mg by mouth daily.   atorvastatin (LIPITOR) 80 MG tablet Take 1 tablet (80 mg total) by mouth daily.   buPROPion (WELLBUTRIN XL) 150 MG 24 hr tablet Take 1 tablet (150 mg total) by mouth every morning.   furosemide (LASIX) 40 MG tablet TAKE 1 TABLET BY MOUTH ONCE DAILY AS NEEDED FOR EDEMA OR 3 LBS WEIGHT GAIN IN 24 HOURS.   hydrochlorothiazide (HYDRODIURIL) 25 MG tablet Take 1 tablet (25 mg total) by mouth daily.   metoprolol tartrate (LOPRESSOR) 50 MG tablet Take 1 tablet by mouth twice daily    nitroGLYCERIN (NITROSTAT) 0.4 MG SL tablet Place 1 tablet (0.4 mg total) under the tongue every 5 (five) minutes x 3 doses as needed for chest pain.   olmesartan (BENICAR) 40 MG tablet Take 1 tablet by mouth once daily   omeprazole (PRILOSEC) 20 MG capsule Take 1 capsule (20 mg total) by mouth daily.   Semaglutide, 1 MG/DOSE, 4 MG/3ML SOPN Inject 1 mg as directed once a week. DX: E11.65   No facility-administered encounter medications on file as of 12/15/2022.    Allergies  Allergen Reactions   Metformin And Related Nausea Only    Review of Systems As per HPI  Objective:  BP 138/79   Pulse 82   Temp 98.9 F (37.2 C)   Ht 6' (1.829 m)   Wt 280 lb (127 kg)   SpO2 93%   BMI 37.97 kg/m    Wt Readings from Last 3 Encounters:  12/15/22 280 lb (127 kg)  10/10/22 289 lb 6.4 oz (131.3 kg)  06/26/22 289 lb 3.2 oz (131.2 kg)    Physical Exam Constitutional:      General: He is awake. He is not in acute distress.    Appearance: Normal appearance. He is well-developed and well-groomed. He is obese. He is not ill-appearing, toxic-appearing or diaphoretic.  HENT:     Right Ear: No laceration, drainage, swelling or tenderness. No middle ear effusion. There is no impacted cerumen. No foreign body. No mastoid tenderness. No PE tube. No hemotympanum. Tympanic membrane is not injected, scarred, perforated, erythematous, retracted or bulging.     Left Ear: No laceration, drainage, swelling or tenderness.  No middle ear effusion. There is no impacted cerumen. No foreign body. No mastoid tenderness. No PE tube. No hemotympanum. Tympanic membrane is not injected, scarred, perforated, erythematous, retracted or bulging.     Nose: Congestion and rhinorrhea present. Rhinorrhea is clear.     Right Sinus: Maxillary sinus tenderness and frontal sinus tenderness present.     Left Sinus: Maxillary sinus tenderness and frontal sinus tenderness present.     Mouth/Throat:     Lips: No lesions.     Mouth:  Mucous membranes are moist.     Tongue: No lesions.     Palate: No mass.     Pharynx: Posterior oropharyngeal erythema present.     Tonsils: No tonsillar exudate or tonsillar abscesses.  Cardiovascular:     Rate and Rhythm: Normal rate and regular rhythm.     Pulses: Normal pulses.          Radial pulses are 2+ on the right side and 2+ on the left side.       Posterior tibial pulses are 2+ on the right side and 2+ on the left side.     Heart sounds: Normal heart sounds. No murmur heard.    No gallop.  Pulmonary:     Effort: Pulmonary effort is normal. No respiratory distress.     Breath sounds: No stridor. Examination of the right-upper field reveals wheezing, rhonchi and rales. Examination of the left-upper field reveals wheezing, rhonchi and rales. Examination of the right-middle field reveals wheezing, rhonchi and rales. Examination of the left-middle field reveals wheezing, rhonchi and rales. Examination of the right-lower field reveals wheezing, rhonchi and rales. Examination of the left-lower field reveals wheezing, rhonchi and rales. Wheezing, rhonchi and rales present.     Comments: Abnormal breath sounds did not improve with coughing Abdominal:     General: Bowel sounds are normal.     Tenderness: There is no abdominal tenderness.  Musculoskeletal:     Cervical back: Full passive range of motion without pain and neck supple.     Right lower leg: No edema.     Left lower leg: No edema.  Skin:    General: Skin is warm.     Capillary Refill: Capillary refill takes less than 2 seconds.  Neurological:     General: No focal deficit present.     Mental Status: He is alert, oriented to person, place, and time and easily aroused. Mental status is at baseline.     GCS: GCS eye subscore is 4. GCS  verbal subscore is 5. GCS motor subscore is 6.     Motor: No weakness.  Psychiatric:        Attention and Perception: Attention and perception normal.        Mood and Affect: Mood and affect  normal.        Speech: Speech normal.        Behavior: Behavior normal. Behavior is cooperative.        Thought Content: Thought content normal. Thought content does not include homicidal or suicidal ideation. Thought content does not include homicidal or suicidal plan.        Cognition and Memory: Cognition and memory normal.        Judgment: Judgment normal.     Results for orders placed or performed in visit on 10/25/22  HM DIABETES EYE EXAM  Result Value Ref Range   HM Diabetic Eye Exam No Retinopathy No Retinopathy       10/10/2022    3:20 PM 05/30/2022    3:12 PM 03/27/2022   10:07 AM 02/17/2022    8:55 AM 07/20/2021    9:44 AM  Depression screen PHQ 2/9  Decreased Interest 0 0 0 0 0  Down, Depressed, Hopeless 0 0 0 0 0  PHQ - 2 Score 0 0 0 0 0  Altered sleeping 0 0 0    Tired, decreased energy 0 0 0    Change in appetite 0 0 0    Feeling bad or failure about yourself  0 0 0    Trouble concentrating 0 0 0    Moving slowly or fidgety/restless 0 0 0    Suicidal thoughts 0 0 0    PHQ-9 Score 0 0 0    Difficult doing work/chores Not difficult at all Not difficult at all Not difficult at all         05/30/2022    3:13 PM 03/27/2022   10:07 AM 07/20/2021    9:44 AM 06/08/2021    3:23 PM  GAD 7 : Generalized Anxiety Score  Nervous, Anxious, on Edge 0 0 0 0  Control/stop worrying 0 0 0 0  Worry too much - different things 0 0 0 0  Trouble relaxing 0 0 0 0  Restless 0 0 0 0  Easily annoyed or irritable 0 0 0 0  Afraid - awful might happen 0 0 0 0  Total GAD 7 Score 0 0 0 0  Anxiety Difficulty Not difficult at all Not difficult at all Not difficult at all Not difficult at all   Pertinent labs & imaging results that were available during my care of the patient were reviewed by me and considered in my medical decision making.  Assessment & Plan:  Yoon was seen today for head/chest congestion.  Diagnoses and all orders for this visit:  Wheezing Imaging as below. Will  communicate results to patient once available. Will await results to determine next steps.  Will start empiric antibiotics as below.  Reviewed PFTs from 06/29/2022 Reviewed note from [facility] 06/29/22.  Patient has Rx for albuterol inhaler. Encouraged patient to use at home. Patient to call if he needs refill.  -     DG Chest 2 View; Future -     amoxicillin-clavulanate (AUGMENTIN) 875-125 MG tablet; Take 1 tablet by mouth 2 (two) times daily. -     azithromycin (ZITHROMAX) 250 MG tablet; Take 2 tablets on day 1, then 1 tablet daily on days 2 through 5  Acute  cough As above  -     DG Chest 2 View; Future -     amoxicillin-clavulanate (AUGMENTIN) 875-125 MG tablet; Take 1 tablet by mouth 2 (two) times daily. -     azithromycin (ZITHROMAX) 250 MG tablet; Take 2 tablets on day 1, then 1 tablet daily on days 2 through 5  Chest congestion As above.  -     DG Chest 2 View; Future -     amoxicillin-clavulanate (AUGMENTIN) 875-125 MG tablet; Take 1 tablet by mouth 2 (two) times daily. -     azithromycin (ZITHROMAX) 250 MG tablet; Take 2 tablets on day 1, then 1 tablet daily on days 2 through 5   Continue all other maintenance medications.  Follow up plan: Return if symptoms worsen or fail to improve.   Continue healthy lifestyle choices, including diet (rich in fruits, vegetables, and lean proteins, and low in salt and simple carbohydrates) and exercise (at least 30 minutes of moderate physical activity daily).  Written and verbal instructions provided   The above assessment and management plan was discussed with the patient. The patient verbalized understanding of and has agreed to the management plan. Patient is aware to call the clinic if they develop any new symptoms or if symptoms persist or worsen. Patient is aware when to return to the clinic for a follow-up visit. Patient educated on when it is appropriate to go to the emergency department.   Neale Burly, DNP-FNP [facility] [address] [place], [place] [address] [phone]

## 2022-12-19 NOTE — Progress Notes (Signed)
Chronic changes persistent on imaging. Recommend patient follow up with Pulmonology, Dr. Sherene Sires. If symptoms continue, follow up in person.

## 2022-12-26 ENCOUNTER — Other Ambulatory Visit: Payer: Self-pay | Admitting: Family Medicine

## 2022-12-26 DIAGNOSIS — R600 Localized edema: Secondary | ICD-10-CM

## 2023-01-24 ENCOUNTER — Encounter: Payer: Self-pay | Admitting: Family Medicine

## 2023-01-24 ENCOUNTER — Ambulatory Visit: Payer: Commercial Managed Care - PPO | Admitting: Family Medicine

## 2023-01-24 VITALS — BP 109/68 | HR 81 | Temp 98.6°F | Ht 72.0 in | Wt 270.2 lb

## 2023-01-24 DIAGNOSIS — E1169 Type 2 diabetes mellitus with other specified complication: Secondary | ICD-10-CM | POA: Diagnosis not present

## 2023-01-24 DIAGNOSIS — Z9861 Coronary angioplasty status: Secondary | ICD-10-CM

## 2023-01-24 DIAGNOSIS — I2510 Atherosclerotic heart disease of native coronary artery without angina pectoris: Secondary | ICD-10-CM | POA: Diagnosis not present

## 2023-01-24 DIAGNOSIS — E785 Hyperlipidemia, unspecified: Secondary | ICD-10-CM

## 2023-01-24 DIAGNOSIS — I152 Hypertension secondary to endocrine disorders: Secondary | ICD-10-CM

## 2023-01-24 DIAGNOSIS — E1159 Type 2 diabetes mellitus with other circulatory complications: Secondary | ICD-10-CM

## 2023-01-24 LAB — BAYER DCA HB A1C WAIVED: HB A1C (BAYER DCA - WAIVED): 6 % — ABNORMAL HIGH (ref 4.8–5.6)

## 2023-01-24 NOTE — Progress Notes (Signed)
Subjective: CC:DM PCP: Raliegh Ip, DO ZOX:[id number] [name] is a 62 y.o. male presenting to clinic today for:  1. Type 2 Diabetes with hypertension, hyperlipidemia and CAD:  Orts compliance with medications.  Doing very well on Ozempic.  Denies any chest pain, shortness of breath, nausea, vomiting.  No unwanted GI side effects.  He is seeing good weight loss with the medication and overall just feeling better since he has been on the increased dose.  He is compliant with his statin, blood pressure medications.  Diabetes Health Maintenance Due  Topic Date Due   FOOT EXAM  03/28/2023   HEMOGLOBIN A1C  07/25/2023   OPHTHALMOLOGY EXAM  10/20/2023    Last A1c:  Lab Results  Component Value Date   HGBA1C 6.0 (H) 01/24/2023   ROS: Per HPI  Allergies  Allergen Reactions   Metformin And Related Nausea Only   Past Medical History:  Diagnosis Date   Allergy    occasionally    CAD in native artery    a. 2008 - Single-vessel CAD with stent placement to the circumflex  b. 2012 - DES to Diag.  c. 06/2014 - RCA 25% stenosis, LAD 30% stenosis, ramus intermediate 45% stenosis, D1 95% stenosis treated with DES  d.01/2015 - nonobstructive CAD   GERD (gastroesophageal reflux disease)    Hyperlipidemia, mixed    Hypertension    Myocardial infarction (HCC) 2008   Overweight(278.02)    Sleep apnea    CPAP    Current Outpatient Medications:    albuterol (VENTOLIN HFA) 108 (90 Base) MCG/ACT inhaler, INHALE 2 PUFFS INTO LUNGS EVERY 6 HOURS AS NEEDED FOR WHEEZING OR SHORTNESS OF BREATH, Disp: 18 g, Rfl: 0   amLODipine (NORVASC) 10 MG tablet, Take 1 tablet (10 mg total) by mouth daily., Disp: 90 tablet, Rfl: 3   amoxicillin-clavulanate (AUGMENTIN) 875-125 MG tablet, Take 1 tablet by mouth 2 (two) times daily., Disp: 14 tablet, Rfl: 0   aspirin 81 MG chewable tablet, Chew 81 mg by mouth daily., Disp: , Rfl:    atorvastatin (LIPITOR) 80 MG tablet, Take 1 tablet (80 mg total) by mouth  daily., Disp: 90 tablet, Rfl: 3   buPROPion (WELLBUTRIN XL) 150 MG 24 hr tablet, Take 1 tablet (150 mg total) by mouth every morning., Disp: 90 tablet, Rfl: 3   furosemide (LASIX) 40 MG tablet, TAKE 1 TABLET BY MOUTH ONCE DAILY AS NEEDED FOR EDEMA OR 3 LBS WEIGHT GAIN IN 24 HOURS, Disp: 30 tablet, Rfl: 0   hydrochlorothiazide (HYDRODIURIL) 25 MG tablet, Take 1 tablet (25 mg total) by mouth daily., Disp: 90 tablet, Rfl: 3   metoprolol tartrate (LOPRESSOR) 50 MG tablet, Take 1 tablet by mouth twice daily, Disp: 180 tablet, Rfl: 0   nitroGLYCERIN (NITROSTAT) 0.4 MG SL tablet, Place 1 tablet (0.4 mg total) under the tongue every 5 (five) minutes x 3 doses as needed for chest pain., Disp: 25 tablet, Rfl: 1   olmesartan (BENICAR) 40 MG tablet, Take 1 tablet by mouth once daily, Disp: 30 tablet, Rfl: 5   omeprazole (PRILOSEC) 20 MG capsule, Take 1 capsule (20 mg total) by mouth daily., Disp: 30 capsule, Rfl: 0   Semaglutide, 1 MG/DOSE, 4 MG/3ML SOPN, Inject 1 mg as directed once a week. DX: E11.65, Disp: 9 mL, Rfl: 3 Social History   Socioeconomic History   Marital status: Married    Spouse name: Not on file   Number of children: Not on file   Years of education:  Not on file   Highest education level: 11th grade  Occupational History   Occupation: Full time    Employer: [employer]  Tobacco Use   Smoking status: Former    Current packs/day: 0.00    Types: Cigarettes    Start date: 03/20/2012    Quit date: 02/15/2022    Years since quitting: 0.9   Smokeless tobacco: Never  Vaping Use   Vaping status: Never Used  Substance and Sexual Activity   Alcohol use: Yes    Alcohol/week: 0.0 standard drinks of alcohol    Comment: occ   Drug use: No   Sexual activity: Not on file  Other Topics Concern   Not on file  Social History Narrative   Married   No regular exercise   Social Determinants of Health   Financial Resource Strain: Low Risk  (12/15/2022)   Overall Financial Resource Strain  (CARDIA)    Difficulty of Paying Living Expenses: Not hard at all  Food Insecurity: No Food Insecurity (12/15/2022)   Hunger Vital Sign    Worried About Running Out of Food in the Last Year: Never true    Ran Out of Food in the Last Year: Never true  Transportation Needs: No Transportation Needs (12/15/2022)   PRAPARE - Administrator, Civil Service (Medical): No    Lack of Transportation (Non-Medical): No  Physical Activity: Unknown (12/15/2022)   Exercise Vital Sign    Days of Exercise per Week: Patient declined    Minutes of Exercise per Session: Not on file  Stress: No Stress Concern Present (12/15/2022)   [employer] of Occupational Health - Occupational Stress Questionnaire    Feeling of Stress : Not at all  Social Connections: Moderately Isolated (12/15/2022)   Social Connection and Isolation Panel [NHANES]    Frequency of Communication with Friends and Family: Three times a week    Frequency of Social Gatherings with Friends and Family: Three times a week    Attends Religious Services: Never    Active Member of Clubs or Organizations: No    Attends Banker Meetings: Not on file    Marital Status: Married  Intimate Partner Violence: Unknown (05/20/2021)   Received from [employer] [facility], [facility]   HITS    Physically Hurt: Not on file    Insult or Talk Down To: Not on file    Threaten Physical Harm: Not on file    Scream or Curse: Not on file   Family History  Problem Relation Age of Onset   Heart disease Mother    Colon polyps Father    Coronary artery disease Other    Colon polyps Sister    Colon cancer Neg Hx    Esophageal cancer Neg Hx    Rectal cancer Neg Hx    Stomach cancer Neg Hx     Objective: Office vital signs reviewed. BP 109/68   Pulse 81   Temp 98.6 F (37 C)   Ht 6' (1.829 m)   Wt 270 lb 3.2 oz (122.6 kg)   SpO2 95%   BMI 36.65 kg/m   Physical Examination:  General: Awake, alert, obese, No acute distress HEENT:  sclera white, MMM Cardio: regular rate and rhythm, S1S2 heard, no murmurs appreciated Pulm: clear to auscultation bilaterally, no wheezes, rhonchi or rales; normal work of breathing on room air  Assessment/ Plan: 62 y.o. male   Controlled type 2 diabetes mellitus with other specified complication, without long-term current use of  insulin (HCC) - Plan: Bayer DCA Hb A1c Waived  CAD S/P percutaneous coronary angioplasty  Hyperlipidemia associated with type 2 diabetes mellitus (HCC)  Hypertension associated with type 2 diabetes mellitus (HCC)   Sugar remains under excellent control with A1c down to 6.0.  His BMI has come down from 39.87-36.65, with a 25 pound weight loss since February.  He will continue Ozempic 1 mg weekly as directed I will plan to advance it should he plateau from a weight standpoint given his history of MI.  Would like to reduce BMI as much as possible and hopefully see continued improvement in blood pressure, cholesterol and sugar.  Continue all medications as directed.  Follow-up with Raynelle Fanning for patient assistance program renewal if needed.    He can follow-up with me in 4 months, sooner if concerns arise  Raliegh Ip, DO [facility] [phone]

## 2023-01-28 ENCOUNTER — Other Ambulatory Visit: Payer: Self-pay | Admitting: Family Medicine

## 2023-01-28 DIAGNOSIS — R600 Localized edema: Secondary | ICD-10-CM

## 2023-02-16 ENCOUNTER — Other Ambulatory Visit: Payer: Self-pay

## 2023-02-16 DIAGNOSIS — Z122 Encounter for screening for malignant neoplasm of respiratory organs: Secondary | ICD-10-CM

## 2023-02-16 DIAGNOSIS — Z87891 Personal history of nicotine dependence: Secondary | ICD-10-CM

## 2023-03-04 ENCOUNTER — Other Ambulatory Visit: Payer: Self-pay | Admitting: Family Medicine

## 2023-03-04 DIAGNOSIS — I2510 Atherosclerotic heart disease of native coronary artery without angina pectoris: Secondary | ICD-10-CM

## 2023-03-04 DIAGNOSIS — I10 Essential (primary) hypertension: Secondary | ICD-10-CM

## 2023-03-05 NOTE — Progress Notes (Signed)
Patient called to cancel lung cancer screening SDMV and LDCT and does not wish to reschedule. Referral closed at this time.

## 2023-03-07 ENCOUNTER — Ambulatory Visit (HOSPITAL_COMMUNITY): Payer: Commercial Managed Care - PPO

## 2023-03-07 ENCOUNTER — Inpatient Hospital Stay: Payer: Commercial Managed Care - PPO | Admitting: Oncology

## 2023-04-06 ENCOUNTER — Other Ambulatory Visit: Payer: Self-pay | Admitting: Cardiology

## 2023-04-06 ENCOUNTER — Other Ambulatory Visit: Payer: Self-pay | Admitting: Internal Medicine

## 2023-05-11 ENCOUNTER — Other Ambulatory Visit: Payer: Self-pay | Admitting: Cardiology

## 2023-05-15 ENCOUNTER — Other Ambulatory Visit: Payer: Self-pay | Admitting: Family Medicine

## 2023-05-15 DIAGNOSIS — I10 Essential (primary) hypertension: Secondary | ICD-10-CM

## 2023-05-15 DIAGNOSIS — Z87891 Personal history of nicotine dependence: Secondary | ICD-10-CM

## 2023-05-15 DIAGNOSIS — I2510 Atherosclerotic heart disease of native coronary artery without angina pectoris: Secondary | ICD-10-CM

## 2023-05-28 ENCOUNTER — Encounter: Payer: Self-pay | Admitting: Family Medicine

## 2023-05-28 ENCOUNTER — Ambulatory Visit: Payer: Commercial Managed Care - PPO | Admitting: Family Medicine

## 2023-05-28 VITALS — BP 115/77 | HR 83 | Ht 72.0 in | Wt 263.0 lb

## 2023-05-28 DIAGNOSIS — E785 Hyperlipidemia, unspecified: Secondary | ICD-10-CM

## 2023-05-28 DIAGNOSIS — I152 Hypertension secondary to endocrine disorders: Secondary | ICD-10-CM

## 2023-05-28 DIAGNOSIS — Z87891 Personal history of nicotine dependence: Secondary | ICD-10-CM

## 2023-05-28 DIAGNOSIS — Z9861 Coronary angioplasty status: Secondary | ICD-10-CM

## 2023-05-28 DIAGNOSIS — J301 Allergic rhinitis due to pollen: Secondary | ICD-10-CM

## 2023-05-28 DIAGNOSIS — E1169 Type 2 diabetes mellitus with other specified complication: Secondary | ICD-10-CM

## 2023-05-28 DIAGNOSIS — K760 Fatty (change of) liver, not elsewhere classified: Secondary | ICD-10-CM | POA: Diagnosis not present

## 2023-05-28 DIAGNOSIS — Z7985 Long-term (current) use of injectable non-insulin antidiabetic drugs: Secondary | ICD-10-CM

## 2023-05-28 DIAGNOSIS — E1159 Type 2 diabetes mellitus with other circulatory complications: Secondary | ICD-10-CM | POA: Diagnosis not present

## 2023-05-28 DIAGNOSIS — K209 Esophagitis, unspecified without bleeding: Secondary | ICD-10-CM

## 2023-05-28 DIAGNOSIS — I2510 Atherosclerotic heart disease of native coronary artery without angina pectoris: Secondary | ICD-10-CM

## 2023-05-28 LAB — BAYER DCA HB A1C WAIVED: HB A1C (BAYER DCA - WAIVED): 5.7 % — ABNORMAL HIGH (ref 4.8–5.6)

## 2023-05-28 MED ORDER — OMEPRAZOLE 20 MG PO CPDR
20.0000 mg | DELAYED_RELEASE_CAPSULE | Freq: Every day | ORAL | 4 refills | Status: DC
Start: 2023-05-28 — End: 2023-12-31

## 2023-05-28 MED ORDER — ATORVASTATIN CALCIUM 80 MG PO TABS: 80.0000 mg | ORAL_TABLET | Freq: Every day | ORAL | 4 refills | Status: DC

## 2023-05-28 MED ORDER — FUROSEMIDE 40 MG PO TABS: 40.0000 mg | ORAL_TABLET | Freq: Every day | ORAL | 4 refills | Status: DC

## 2023-05-28 MED ORDER — FLUTICASONE PROPIONATE 50 MCG/ACT NA SUSP: 2.0000 | Freq: Every day | NASAL | 6 refills | Status: AC

## 2023-05-28 MED ORDER — HYDROCHLOROTHIAZIDE 25 MG PO TABS: 25.0000 mg | ORAL_TABLET | Freq: Every day | ORAL | 4 refills | Status: DC

## 2023-05-28 MED ORDER — SEMAGLUTIDE (2 MG/DOSE) 8 MG/3ML ~~LOC~~ SOPN
2.0000 mg | PEN_INJECTOR | SUBCUTANEOUS | 3 refills | Status: DC
Start: 2023-05-28 — End: 2023-12-31

## 2023-05-28 MED ORDER — METOPROLOL TARTRATE 50 MG PO TABS: 50.0000 mg | ORAL_TABLET | Freq: Two times a day (BID) | ORAL | 4 refills | Status: DC

## 2023-05-28 MED ORDER — BUPROPION HCL ER (XL) 150 MG PO TB24: 150.0000 mg | ORAL_TABLET | Freq: Every morning | ORAL | 4 refills | Status: DC

## 2023-05-28 MED ORDER — DESLORATADINE 5 MG PO TABS: 5.0000 mg | ORAL_TABLET | Freq: Every day | ORAL | 3 refills | Status: DC | PRN

## 2023-05-28 MED ORDER — AMLODIPINE BESYLATE 10 MG PO TABS
10.0000 mg | ORAL_TABLET | Freq: Every day | ORAL | 4 refills | Status: DC
Start: 2023-05-28 — End: 2023-12-31

## 2023-05-28 MED ORDER — OLMESARTAN MEDOXOMIL 40 MG PO TABS: 40.0000 mg | ORAL_TABLET | Freq: Every day | ORAL | 4 refills | Status: DC

## 2023-05-28 NOTE — Patient Instructions (Signed)
 Please call Scott Hatfield to schedule your Colonoscopy. [phone].  You can get your shingles vaccine and your pneumonia vaccine at anytime. Just schedule a nurse visit when ready.

## 2023-05-28 NOTE — Progress Notes (Signed)
 Subjective: CC:DM PCP: Raliegh Ip, DO WJX:BJYNWGN [name] is a 63 y.o. male presenting to clinic today for:  1. Type 2 Diabetes with hypertension, hyperlipidemia w/ CAD:  Patient reports compliance with all medications.  He sadly has gone back to smoking but does want to try and get off of them at some point.  Diabetes Health Maintenance Due  Topic Date Due   FOOT EXAM  03/28/2023   HEMOGLOBIN A1C  07/25/2023   OPHTHALMOLOGY EXAM  10/20/2023    Last A1c:  Lab Results  Component Value Date   HGBA1C 6.0 (H) 01/24/2023    ROS: Denies dizziness, LOC, polyuria, polydipsia, unintended weight loss/gain, foot ulcerations, numbness or tingling in extremities, shortness of breath or chest pain.  2.  Allergic rhinitis Reports allergy symptoms, nasal congestion a little bit of postnasal drainage causing cough.  Not currently taking any nasal sprays or allergy pills but would like to start.  No hemoptysis, fevers or labored breathing.  He uses albuterol inhaler if he knows he is going to go out and do something physical and this does seem to open them up  ROS: Per HPI  Allergies  Allergen Reactions   Metformin And Related Nausea Only   Past Medical History:  Diagnosis Date   Allergy    occasionally    CAD in native artery    a. 2008 - Single-vessel CAD with stent placement to the circumflex  b. 2012 - DES to Diag.  c. 06/2014 - RCA 25% stenosis, LAD 30% stenosis, ramus intermediate 45% stenosis, D1 95% stenosis treated with DES  d.01/2015 - nonobstructive CAD   GERD (gastroesophageal reflux disease)    Hyperlipidemia, mixed    Hypertension    Myocardial infarction (HCC) 2008   Overweight(278.02)    Sleep apnea    CPAP    Current Outpatient Medications:    albuterol (VENTOLIN HFA) 108 (90 Base) MCG/ACT inhaler, INHALE 2 PUFFS INTO LUNGS EVERY 6 HOURS AS NEEDED FOR WHEEZING OR SHORTNESS OF BREATH, Disp: 18 g, Rfl: 0   amLODipine (NORVASC) 10 MG tablet, Take 1 tablet by  mouth once daily, Disp: 90 tablet, Rfl: 0   amoxicillin-clavulanate (AUGMENTIN) 875-125 MG tablet, Take 1 tablet by mouth 2 (two) times daily., Disp: 14 tablet, Rfl: 0   aspirin 81 MG chewable tablet, Chew 81 mg by mouth daily., Disp: , Rfl:    atorvastatin (LIPITOR) 80 MG tablet, Take 1 tablet by mouth once daily, Disp: 15 tablet, Rfl: 0   buPROPion (WELLBUTRIN XL) 150 MG 24 hr tablet, TAKE 1 TABLET BY MOUTH ONCE DAILY IN THE MORNING, Disp: 90 tablet, Rfl: 0   furosemide (LASIX) 40 MG tablet, TAKE 1 TABLET BY MOUTH ONCE DAILY AS NEEDED FOR EDEMA OR 3 LBS WEIGHT GAIN IN 24 HOURS, Disp: 90 tablet, Rfl: 1   hydrochlorothiazide (HYDRODIURIL) 25 MG tablet, Take 1 tablet by mouth once daily, Disp: 90 tablet, Rfl: 0   metoprolol tartrate (LOPRESSOR) 50 MG tablet, Take 1 tablet by mouth twice daily, Disp: 180 tablet, Rfl: 0   nitroGLYCERIN (NITROSTAT) 0.4 MG SL tablet, Place 1 tablet (0.4 mg total) under the tongue every 5 (five) minutes x 3 doses as needed for chest pain., Disp: 25 tablet, Rfl: 1   olmesartan (BENICAR) 40 MG tablet, Take 1 tablet by mouth once daily, Disp: 30 tablet, Rfl: 5   omeprazole (PRILOSEC) 20 MG capsule, Take 1 capsule (20 mg total) by mouth daily., Disp: 30 capsule, Rfl: 0   Semaglutide,  1 MG/DOSE, 4 MG/3ML SOPN, Inject 1 mg as directed once a week. DX: E11.65, Disp: 9 mL, Rfl: 3 Social History   Socioeconomic History   Marital status: Married    Spouse name: Not on file   Number of children: Not on file   Years of education: Not on file   Highest education level: 11th grade  Occupational History   Occupation: Full time    Employer: [employer]  Tobacco Use   Smoking status: Former    Current packs/day: 0.00    Types: Cigarettes    Start date: 03/20/2012    Quit date: 02/15/2022    Years since quitting: 1.2   Smokeless tobacco: Never  Vaping Use   Vaping status: Never Used  Substance and Sexual Activity   Alcohol use: Yes    Alcohol/week: 0.0 standard drinks  of alcohol    Comment: occ   Drug use: No   Sexual activity: Not on file  Other Topics Concern   Not on file  Social History Narrative   Married   No regular exercise   Social Drivers of Health   Financial Resource Strain: Low Risk  (05/24/2023)   Overall Financial Resource Strain (CARDIA)    Difficulty of Paying Living Expenses: Not very hard  Food Insecurity: No Food Insecurity (05/24/2023)   Hunger Vital Sign    Worried About Running Out of Food in the Last Year: Never true    Ran Out of Food in the Last Year: Never true  Transportation Needs: No Transportation Needs (05/24/2023)   PRAPARE - Administrator, Civil Service (Medical): No    Lack of Transportation (Non-Medical): No  Physical Activity: Insufficiently Active (05/24/2023)   Exercise Vital Sign    Days of Exercise per Week: 2 days    Minutes of Exercise per Session: 10 min  Stress: No Stress Concern Present (05/24/2023)   [employer] of Occupational Health - Occupational Stress Questionnaire    Feeling of Stress : Only a little  Social Connections: Moderately Isolated (05/24/2023)   Social Connection and Isolation Panel [NHANES]    Frequency of Communication with Friends and Family: Twice a week    Frequency of Social Gatherings with Friends and Family: Twice a week    Attends Religious Services: Never    Database administrator or Organizations: No    Attends Engineer, structural: Not on file    Marital Status: Married  Intimate Partner Violence: Unknown (05/20/2021)   Received from [employer], [facility]   HITS    Physically Hurt: Not on file    Insult or Talk Down To: Not on file    Threaten Physical Harm: Not on file    Scream or Curse: Not on file   Family History  Problem Relation Age of Onset   Heart disease Mother    Colon polyps Father    Coronary artery disease Other    Colon polyps Sister    Colon cancer Neg Hx    Esophageal cancer Neg Hx    Rectal cancer Neg Hx     Stomach cancer Neg Hx     Objective: Office vital signs reviewed. BP 115/77   Pulse 83   Ht 6' (1.829 m)   Wt 263 lb (119.3 kg)   SpO2 93%   BMI 35.67 kg/m   Physical Examination:  General: Awake, alert, obese, smells of tobacco.  No acute distress HEENT: Moist mucous membranes.  TMs intact bilaterally.  No oropharyngeal masses or exudates Cardio: regular rate and rhythm, S1S2 heard, no murmurs appreciated Pulm: clear to auscultation bilaterally, no wheezes, rhonchi or rales; normal work of breathing on room air  Diabetic Foot Exam - Simple   Simple Foot Form Diabetic Foot exam was performed with the following findings: Yes 05/28/2023  4:13 PM  Visual Inspection See comments: Yes Sensation Testing Intact to touch and monofilament testing bilaterally: Yes Pulse Check Posterior Tibialis and Dorsalis pulse intact bilaterally: Yes Comments Onychomycotic changes to the nails with partial absence of the medial left great toenail      Assessment/ Plan: 63 y.o. male   Diabetes mellitus treated with injections of non-insulin medication (HCC) - Plan: Bayer DCA Hb A1c Waived, Microalbumin / creatinine urine ratio, CMP14+EGFR, Semaglutide, 2 MG/DOSE, 8 MG/3ML SOPN  Hyperlipidemia associated with type 2 diabetes mellitus (HCC) - Plan: LDL Cholesterol, Direct, CMP14+EGFR, Semaglutide, 2 MG/DOSE, 8 MG/3ML SOPN  Hypertension associated with type 2 diabetes mellitus (HCC) - Plan: CMP14+EGFR, amLODipine (NORVASC) 10 MG tablet, furosemide (LASIX) 40 MG tablet, hydrochlorothiazide (HYDRODIURIL) 25 MG tablet, metoprolol tartrate (LOPRESSOR) 50 MG tablet, olmesartan (BENICAR) 40 MG tablet, Semaglutide, 2 MG/DOSE, 8 MG/3ML SOPN  CAD S/P percutaneous coronary angioplasty - Plan: CMP14+EGFR, amLODipine (NORVASC) 10 MG tablet, metoprolol tartrate (LOPRESSOR) 50 MG tablet, Semaglutide, 2 MG/DOSE, 8 MG/3ML SOPN  Fatty liver - Plan: CMP14+EGFR, Semaglutide, 2 MG/DOSE, 8 MG/3ML SOPN  Morbid obesity  (HCC) - Plan: buPROPion (WELLBUTRIN XL) 150 MG 24 hr tablet  Former heavy tobacco smoker - Plan: buPROPion (WELLBUTRIN XL) 150 MG 24 hr tablet  Esophagitis - Plan: omeprazole (PRILOSEC) 20 MG capsule  Seasonal allergic rhinitis due to pollen - Plan: desloratadine (CLARINEX) 5 MG tablet, fluticasone (FLONASE) 50 MCG/ACT nasal spray   Sugar remains under good control with A1c of 5.7 today.  However, he is still in the morbidly obese category with his CAD history, hyperlipidemia and fatty liver so we are going to proceed with advancing the Ozempic dose to 2 mg weekly.  He will continue all other medications as prescribed and refills on all have been sent.  Direct LDL collected today but I would like a formal fasting lipid panel at next visit  Reinforced need for smoking cessation.  I have renewed his Wellbutrin.  Did not discuss esophagitis in detail but PPI needed renewal  For his allergic rhinitis I have sent over Clarinex, Flonase.  Again reinforce smoking cessation to reduce allergy symptoms  Eliodoro Guerin, DO [facility] [phone]

## 2023-05-29 ENCOUNTER — Encounter: Payer: Self-pay | Admitting: Family Medicine

## 2023-05-29 LAB — CMP14+EGFR
ALT: 22 IU/L (ref 0–44)
AST: 15 IU/L (ref 0–40)
Albumin: 4.3 g/dL (ref 3.9–4.9)
Alkaline Phosphatase: 81 IU/L (ref 44–121)
BUN/Creatinine Ratio: 13 (ref 10–24)
BUN: 13 mg/dL (ref 8–27)
Bilirubin Total: 0.7 mg/dL (ref 0.0–1.2)
CO2: 21 mmol/L (ref 20–29)
Calcium: 9 mg/dL (ref 8.6–10.2)
Chloride: 101 mmol/L (ref 96–106)
Creatinine, Ser: 1.03 mg/dL (ref 0.76–1.27)
Globulin, Total: 2.6 g/dL (ref 1.5–4.5)
Glucose: 85 mg/dL (ref 70–99)
Potassium: 4.3 mmol/L (ref 3.5–5.2)
Sodium: 138 mmol/L (ref 134–144)
Total Protein: 6.9 g/dL (ref 6.0–8.5)
eGFR: 82 mL/min/{1.73_m2} (ref >59)

## 2023-05-29 LAB — LDL CHOLESTEROL, DIRECT: LDL Direct: 63 mg/dL (ref 0–99)

## 2023-06-20 ENCOUNTER — Other Ambulatory Visit: Payer: Self-pay | Admitting: Family Medicine

## 2023-06-27 NOTE — Progress Notes (Unsigned)
 Cardiology Office Note:   Date:  06/28/2023  ID:  Scott Hatfield, DOB February 21, 1960, MRN [id number] PCP: Eliodoro Guerin, DO  [facility] Providers Cardiologist:  None {  History of Present Illness:   Scott Hatfield is a 63 y.o. male who presents for evaluation of coronary artery disease. He had a history of circumflex stenting in another town in 2008. He presented urgently in 10/12 with unstable angina and ST segment elevation and was found to have an occluded diagonal. He had a drug-eluting stent placed.  He was in the hospital in May of 2016 with unstable angina and was found to have 95% stenosis in the diagonal again treated with a drug-eluting stent.  He was again in the hospital in Dec of 2016.   He again had chest pain but cath at that time demonstrated non obstructive disease with patent stents.  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  He was managed medically.    Since I last saw him he has done well.  He cuts wood.  He tries to stay active although he does not exercise routinely.  Unfortunately he stopped smoking for about a year and a half but restarted.  He is lost weight on Wegovy .  The patient denies any new symptoms such as chest discomfort, neck or arm discomfort. There has been no new shortness of breath, PND or orthopnea. There have been no reported palpitations, presyncope or syncope. .   ROS: As stated in the HPI and negative for all other systems.  Studies Reviewed:    EKG:   EKG Interpretation Date/Time:  Thursday Jun 28 2023 16:05:57 EDT Ventricular Rate:  82 PR Interval:  186 QRS Duration:  86 QT Interval:  362 QTC Calculation: 422 R Axis:   58  Text Interpretation: Normal sinus rhythm Nonspecific T wave abnormality When compared with ECG of  4/`16/24 Confirmed by Eilleen Grates ([id number]) on 06/28/2023 4:20:12 PM    Risk Assessment/Calculations:              Physical Exam:   VS:  BP 126/78   Pulse 74    Ht 6' (1.829 m)   Wt 259 lb (117.5 kg)   SpO2 97%   BMI 35.13 kg/m    Wt Readings from Last 3 Encounters:  06/28/23 259 lb (117.5 kg)  05/28/23 263 lb (119.3 kg)  01/24/23 270 lb 3.2 oz (122.6 kg)     GEN: Well nourished, well developed in no acute distress NECK: No JVD; No carotid bruits CARDIAC: RRR, no murmurs, rubs, gallops RESPIRATORY:  Clear to auscultation without rales, wheezing or rhonchi  ABDOMEN: Soft, non-tender, non-distended EXTREMITIES:  No edema; No deformity   ASSESSMENT AND PLAN:   CAD, NATIVE VESSEL:  The patient has no new sypmtoms.  No further cardiovascular testing is indicated.  We will continue with aggressive risk reduction and meds as listed.  HYPERTENSION, UNSPECIFIED:  The blood pressure is at target. No change in medications is indicated. We will continue with therapeutic lifestyle changes (TLC).   HYPERLIPIDEMIA-MIXED :  LDL was 63.  No change in therapy.  He will get an LP(a) drawn at the next blood draw.  TOBACCO ABUSE : I offered him Chantix .  He is unfortunately not committed to stopping smoking at this point but will call me if he wants another prescription.  This seemed to work in the past.  OVERWEIGHT/OBESITY : I am proud of his weight loss  and encouraged more of the same.   SLEEP APNEA : He continues to use CPAP.  No change in therapy.    Follow up with me in 2 years  Signed, Eilleen Grates, MD

## 2023-06-28 ENCOUNTER — Encounter: Payer: Self-pay | Admitting: Cardiology

## 2023-06-28 ENCOUNTER — Ambulatory Visit: Admitting: Cardiology

## 2023-06-28 VITALS — BP 126/78 | HR 74 | Ht 72.0 in | Wt 259.0 lb

## 2023-06-28 DIAGNOSIS — G4733 Obstructive sleep apnea (adult) (pediatric): Secondary | ICD-10-CM | POA: Diagnosis not present

## 2023-06-28 DIAGNOSIS — E785 Hyperlipidemia, unspecified: Secondary | ICD-10-CM

## 2023-06-28 DIAGNOSIS — I2510 Atherosclerotic heart disease of native coronary artery without angina pectoris: Secondary | ICD-10-CM | POA: Diagnosis not present

## 2023-06-28 DIAGNOSIS — Z720 Tobacco use: Secondary | ICD-10-CM

## 2023-06-28 NOTE — Patient Instructions (Signed)
 Medication Instructions:   Your physician recommends that you continue on your current medications as directed. Please refer to the Current Medication list given to you today.  Labwork: Lipid -Lipoprotein "a" in November  Testing/Procedures: None   Follow-Up: 2 years Dr.Hochrein  Any Other Special Instructions Will Be Listed Below (If Applicable).  If you need a refill on your cardiac medications before your next appointment, please call your pharmacy.

## 2023-07-31 ENCOUNTER — Telehealth: Payer: Self-pay | Admitting: Cardiology

## 2023-07-31 ENCOUNTER — Telehealth: Payer: Self-pay

## 2023-07-31 DIAGNOSIS — I2510 Atherosclerotic heart disease of native coronary artery without angina pectoris: Secondary | ICD-10-CM

## 2023-07-31 NOTE — Telephone Encounter (Signed)
 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 Pt stated he had faxed twice but had received no answer from us . He was concerned he would get fired due to needing paperwork filled out today or tomorrow. Spoke to triage nurse Clarance Cristal.

## 2023-07-31 NOTE — Telephone Encounter (Signed)
 Pt is needing DOT paperwork filled out asap.

## 2023-07-31 NOTE — Telephone Encounter (Signed)
See phone note from 6/17.

## 2023-07-31 NOTE — Telephone Encounter (Signed)
 Received DOT paperwork from front desk on 6/17. Pt was called. Pt aware that he needs a POET. POET ordered. Instructions sent via MyChart. Attestation pended. Pt verbalized understanding. All questions if any were answered.

## 2023-08-01 ENCOUNTER — Telehealth (HOSPITAL_COMMUNITY): Payer: Self-pay | Admitting: *Deleted

## 2023-08-01 ENCOUNTER — Telehealth: Payer: Self-pay

## 2023-08-01 DIAGNOSIS — I2510 Atherosclerotic heart disease of native coronary artery without angina pectoris: Secondary | ICD-10-CM

## 2023-08-01 NOTE — Telephone Encounter (Signed)
-----   Message from Eilleen Grates sent at 08/01/2023  4:35 PM EDT ----- This guy needs his POET (Plain Old Exercise Treadmill) but I don't see an attestation to sign. ----- Message ----- From: Jami Mcclintock Sent: 08/01/2023   7:50 AM EDT To: Eilleen Grates, MD  Good Morning!  Will you please sign the ATTESTATION for patients scheduled GXT tomorrow am. Thank you so much!

## 2023-08-01 NOTE — Telephone Encounter (Signed)
 Patient given instructions for upcoming stress test.  Argentina Bees, RN

## 2023-08-02 ENCOUNTER — Ambulatory Visit: Payer: Self-pay | Admitting: Cardiology

## 2023-08-02 ENCOUNTER — Telehealth: Payer: Self-pay

## 2023-08-02 ENCOUNTER — Ambulatory Visit (HOSPITAL_COMMUNITY)
Admission: RE | Admit: 2023-08-02 | Discharge: 2023-08-02 | Disposition: A | Discharge status: Home / Self Care | Source: Ambulatory Visit | Attending: Cardiology | Admitting: Cardiology

## 2023-08-02 ENCOUNTER — Telehealth: Payer: Self-pay | Admitting: Cardiology

## 2023-08-02 DIAGNOSIS — I2510 Atherosclerotic heart disease of native coronary artery without angina pectoris: Secondary | ICD-10-CM | POA: Insufficient documentation

## 2023-08-02 LAB — EXERCISE TOLERANCE TEST
Angina Index: 0
Base ST Depression (mm): 0 mm
Duke Treadmill Score: 8
Estimated workload: 10.1
Exercise duration (min): 8 min
Exercise duration (sec): 1 s
MPHR: 158 {beats}/min
Peak HR: 150 {beats}/min
Percent HR: 94 %
RPE: 18
Rest HR: 85 {beats}/min
ST Depression (mm): 0 mm

## 2023-08-02 NOTE — Telephone Encounter (Signed)
 Pt called and notified that Dr. Lavonne Prairie is not yet able to see the results of his stress test. Pt was told that as soon as he can see the results he will fill out the DOT paperwork and it will be faxed the the pt's preferred location. Pt verbalized understanding. All questions if any were answered.

## 2023-08-02 NOTE — Telephone Encounter (Signed)
 See telephone note on 6/19

## 2023-08-02 NOTE — Telephone Encounter (Signed)
 Spoke with pt and let him know that Dr. Lavonne Prairie has the paperwork to fill out and that I will call him when it is ready to pick up. Pt verbalized understanding. All questions if any were answered.

## 2023-08-02 NOTE — Telephone Encounter (Signed)
 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

## 2023-08-03 NOTE — Telephone Encounter (Signed)
 Correction. DOT paperwork faxed 6/20

## 2023-08-03 NOTE — Telephone Encounter (Signed)
 Pt calling back in about DOT paperwork. Stress results are in.

## 2023-08-03 NOTE — Telephone Encounter (Signed)
 Spoke with pt regarding his DOT paperwork. DOT paperwork was faxed to the pt's preferred number 6/19. Pt aware. Pt verbalized understanding. All questions if any were answered.

## 2023-08-05 ENCOUNTER — Other Ambulatory Visit: Payer: Self-pay | Admitting: Family Medicine

## 2023-08-12 ENCOUNTER — Other Ambulatory Visit: Payer: Self-pay | Admitting: Family Medicine

## 2023-08-19 ENCOUNTER — Other Ambulatory Visit: Payer: Self-pay | Admitting: Family Medicine

## 2023-12-24 ENCOUNTER — Other Ambulatory Visit: Payer: Self-pay | Admitting: *Deleted

## 2023-12-24 DIAGNOSIS — I2510 Atherosclerotic heart disease of native coronary artery without angina pectoris: Secondary | ICD-10-CM

## 2023-12-24 MED ORDER — NITROGLYCERIN 0.4 MG SL SUBL: 0.4000 mg | SUBLINGUAL_TABLET | SUBLINGUAL | 0 refills | Status: AC | PRN

## 2023-12-31 ENCOUNTER — Encounter: Payer: Self-pay | Admitting: Family Medicine

## 2023-12-31 ENCOUNTER — Ambulatory Visit: Payer: Self-pay | Admitting: Family Medicine

## 2023-12-31 VITALS — BP 120/71 | HR 78 | Temp 97.9°F | Ht 72.0 in | Wt 247.2 lb

## 2023-12-31 DIAGNOSIS — N3943 Post-void dribbling: Secondary | ICD-10-CM

## 2023-12-31 DIAGNOSIS — Z0000 Encounter for general adult medical examination without abnormal findings: Secondary | ICD-10-CM

## 2023-12-31 DIAGNOSIS — E1169 Type 2 diabetes mellitus with other specified complication: Secondary | ICD-10-CM

## 2023-12-31 DIAGNOSIS — D126 Benign neoplasm of colon, unspecified: Secondary | ICD-10-CM

## 2023-12-31 DIAGNOSIS — L723 Sebaceous cyst: Secondary | ICD-10-CM

## 2023-12-31 DIAGNOSIS — D1721 Benign lipomatous neoplasm of skin and subcutaneous tissue of right arm: Secondary | ICD-10-CM

## 2023-12-31 DIAGNOSIS — E1159 Type 2 diabetes mellitus with other circulatory complications: Secondary | ICD-10-CM

## 2023-12-31 DIAGNOSIS — I2510 Atherosclerotic heart disease of native coronary artery without angina pectoris: Secondary | ICD-10-CM

## 2023-12-31 DIAGNOSIS — K209 Esophagitis, unspecified without bleeding: Secondary | ICD-10-CM

## 2023-12-31 DIAGNOSIS — J301 Allergic rhinitis due to pollen: Secondary | ICD-10-CM

## 2023-12-31 DIAGNOSIS — Z7985 Long-term (current) use of injectable non-insulin antidiabetic drugs: Secondary | ICD-10-CM

## 2023-12-31 DIAGNOSIS — D369 Benign neoplasm, unspecified site: Secondary | ICD-10-CM

## 2023-12-31 DIAGNOSIS — Z0001 Encounter for general adult medical examination with abnormal findings: Secondary | ICD-10-CM

## 2023-12-31 DIAGNOSIS — E119 Type 2 diabetes mellitus without complications: Secondary | ICD-10-CM

## 2023-12-31 DIAGNOSIS — Z23 Encounter for immunization: Secondary | ICD-10-CM | POA: Diagnosis not present

## 2023-12-31 DIAGNOSIS — I152 Hypertension secondary to endocrine disorders: Secondary | ICD-10-CM

## 2023-12-31 DIAGNOSIS — E66811 Obesity, class 1: Secondary | ICD-10-CM

## 2023-12-31 DIAGNOSIS — F17200 Nicotine dependence, unspecified, uncomplicated: Secondary | ICD-10-CM

## 2023-12-31 DIAGNOSIS — E785 Hyperlipidemia, unspecified: Secondary | ICD-10-CM

## 2023-12-31 LAB — BAYER DCA HB A1C WAIVED: HB A1C (BAYER DCA - WAIVED): 5.5 % (ref 4.8–5.6)

## 2023-12-31 MED ORDER — OLMESARTAN MEDOXOMIL 40 MG PO TABS: 40.0000 mg | ORAL_TABLET | Freq: Every day | ORAL | 4 refills | Status: AC

## 2023-12-31 MED ORDER — OMEPRAZOLE 20 MG PO CPDR: 20.0000 mg | DELAYED_RELEASE_CAPSULE | Freq: Every day | ORAL | 4 refills | Status: AC

## 2023-12-31 MED ORDER — BUPROPION HCL ER (XL) 150 MG PO TB24: 150.0000 mg | ORAL_TABLET | Freq: Every morning | ORAL | 4 refills | Status: AC

## 2023-12-31 MED ORDER — ATORVASTATIN CALCIUM 80 MG PO TABS: 80.0000 mg | ORAL_TABLET | Freq: Every day | ORAL | 4 refills | Status: AC

## 2023-12-31 MED ORDER — DESLORATADINE 5 MG PO TABS: 5.0000 mg | ORAL_TABLET | Freq: Every day | ORAL | 4 refills | Status: AC | PRN

## 2023-12-31 MED ORDER — SEMAGLUTIDE (2 MG/DOSE) 8 MG/3ML ~~LOC~~ SOPN: 2.0000 mg | PEN_INJECTOR | SUBCUTANEOUS | 4 refills | Status: DC

## 2023-12-31 MED ORDER — FUROSEMIDE 40 MG PO TABS: 40.0000 mg | ORAL_TABLET | Freq: Every day | ORAL | 4 refills | Status: AC

## 2023-12-31 MED ORDER — METOPROLOL TARTRATE 50 MG PO TABS: 50.0000 mg | ORAL_TABLET | Freq: Two times a day (BID) | ORAL | 4 refills | Status: AC

## 2023-12-31 MED ORDER — AMLODIPINE BESYLATE 10 MG PO TABS: 10.0000 mg | ORAL_TABLET | Freq: Every day | ORAL | 4 refills | Status: AC

## 2023-12-31 MED ORDER — HYDROCHLOROTHIAZIDE 25 MG PO TABS: 25.0000 mg | ORAL_TABLET | Freq: Every day | ORAL | 4 refills | Status: AC

## 2023-12-31 NOTE — Patient Instructions (Addendum)
 Please schedule the following ASAP.  You are OVERDUE for:  Lung cancer screening.  Colon cancer screening.  Your colonoscopy showed PREcancerous polyps= they can turn into colon cancer.   I HIGHLY recommend you reconsider having this checked again. Diabetic eye exam. Referral to Dr Kallie placed for lipoma/ cyst removal

## 2023-12-31 NOTE — Progress Notes (Signed)
 Scott Hatfield is a 63 y.o. male presents to office today for annual physical exam examination.    Overall doing well but does want to discuss a couple of items that he needs a referral for.  He has a lesion on his right back that he thinks is a cyst.  He has had these recurrently and he has been previously able to express the fluid from the cyst but this time it is not coming out and is really itching him.  He also has a lipoma of the right upper arm that seems to be getting bigger and is becoming bothersome.  He has previously seen Dr. Morna Pander in [place] for lesion removals and he would like to have referral back to her to get these taken care of.  With regards to his diabetes, he is compliant with all medications including medicines for his cholesterol, blood pressure and CAD.  He does not report any hypoglycemic episodes.  No GI side effects from the semaglutide .  He continues to feel better on this medication, particularly with weight loss.  He does admit that he is smoking about 3/4 pack/day.  Denies any hemoptysis, unplanned weight loss, night sweats, peripheral claudication symptoms or difficulty swallowing.  No shortness of breath or chest pain.  He is overdue for lung cancer screening and colon cancer screening but reports reluctance to get proceed with colonoscopy.  He has not yet scheduled his diabetic eye exam but will do so.  Amenable to flu shot today but would like to hold off on pneumonia and shingles vaccinations  He reports occasional urinary dribbling but no other prostate symptoms  Health Maintenance Due  Topic Date Due   Lung Cancer Screening  01/24/2023   Diabetic kidney evaluation - Urine ACR  03/28/2023   Influenza Vaccine  09/14/2023   OPHTHALMOLOGY EXAM  10/20/2023   HEMOGLOBIN A1C  11/27/2023    Immunization History  Administered Date(s) Administered   Influenza, Seasonal, Injecte, Preservative Fre 12/03/2018   Influenza,inj,Quad PF,6+ Mos 12/08/2014,  04/04/2016, 12/25/2016, 12/28/2017   Moderna Sars-Covid-2 Vaccination 05/27/2019, 06/24/2019   Tdap 12/28/2017   Past Medical History:  Diagnosis Date   Allergy    occasionally    Asthmatic bronchitis with acute exacerbation 03/23/2022   Flared dec 2023 > quit smoking 02/2022 and stopped all inhalers late Jan 2024 s recurrence   - 03/23/2022  After extensive coaching inhaler device,  effectiveness =    60%(short ti)   - PFT's  06/26/2022   FEV1 2.84 (74 % ) ratio 0.79  p 5 % improvement from saba p nothing prior to study with   FV curve min concavity to exp curve  and ERV 10% at wt 289        Benign lipomatous neoplasm of skin, subcu of right arm 09/05/2018   CAD in native artery    a. 2008 - Single-vessel CAD with stent placement to the circumflex  b. 2012 - DES to Diag.  c. 06/2014 - RCA 25% stenosis, LAD 30% stenosis, ramus intermediate 45% stenosis, D1 95% stenosis treated with DES  d.01/2015 - nonobstructive CAD   Epidermoid cyst of skin of back 03/27/2022   GERD (gastroesophageal reflux disease)    Hyperlipidemia, mixed    Hypertension    Myocardial infarction (HCC) 2008   Overweight(278.02)    Sebaceous cyst 09/05/2018   right inner thigh     Sleep apnea    CPAP   Social History   Socioeconomic History  Marital status: Married    Spouse name: Not on file   Number of children: Not on file   Years of education: Not on file   Highest education level: 11th grade  Occupational History   Occupation: Full time    Employer: [employer]  Tobacco Use   Smoking status: Former    Current packs/day: 0.00    Types: Cigarettes    Start date: 03/20/2012    Quit date: 02/15/2022    Years since quitting: 1.8   Smokeless tobacco: Never  Vaping Use   Vaping status: Never Used  Substance and Sexual Activity   Alcohol use: Yes    Alcohol/week: 0.0 standard drinks of alcohol    Comment: occ   Drug use: No   Sexual activity: Not on file  Other Topics Concern   Not on file  Social  History Narrative   Married   No regular exercise   Social Drivers of Health   Financial Resource Strain: Low Risk  (12/27/2023)   Overall Financial Resource Strain (CARDIA)    Difficulty of Paying Living Expenses: Not very hard  Food Insecurity: No Food Insecurity (12/27/2023)   Hunger Vital Sign    Worried About Running Out of Food in the Last Year: Never true    Ran Out of Food in the Last Year: Never true  Transportation Needs: No Transportation Needs (12/27/2023)   PRAPARE - Administrator, Civil Service (Medical): No    Lack of Transportation (Non-Medical): No  Physical Activity: Unknown (12/27/2023)   Exercise Vital Sign    Days of Exercise per Week: 1 day    Minutes of Exercise per Session: Patient declined  Stress: No Stress Concern Present (12/27/2023)   [employer] of Occupational Health - Occupational Stress Questionnaire    Feeling of Stress: Not at all  Social Connections: Moderately Isolated (12/27/2023)   Social Connection and Isolation Panel    Frequency of Communication with Friends and Family: Twice a week    Frequency of Social Gatherings with Friends and Family: Once a week    Attends Religious Services: Patient declined    Database Administrator or Organizations: No    Attends Engineer, Structural: Not on file    Marital Status: Married  Intimate Partner Violence: Unknown (05/20/2021)   Received from [facility]   HITS    Physically Hurt: Not on file    Insult or Talk Down To: Not on file    Threaten Physical Harm: Not on file    Scream or Curse: Not on file   Past Surgical History:  Procedure Laterality Date   CARDIAC CATHETERIZATION N/A 07/03/2014   Procedure: Left Heart Cath and Coronary Angiography;  Surgeon: Peter M Jordan, MD;  Location[facility] INVASIVE CV LAB;  Service: Cardiovascular;  Laterality: N/A;   CARDIAC CATHETERIZATION N/A 07/03/2014   Procedure: Coronary Stent Intervention;  Surgeon: Peter M Jordan, MD;   Location: [facility] INVASIVE CV LAB;  Service: Cardiovascular;  Laterality: N/A;   CARDIAC CATHETERIZATION N/A 01/26/2015   Procedure: Left Heart Cath and Coronary Angiography;  Surgeon: Peter M Jordan, MD;  Location: [facility] INVASIVE CV LAB;  Service: Cardiovascular;  Laterality: N/A;   CORONARY ANGIOPLASTY     2016     3 stents   CORONARY STENT PLACEMENT  2008, 2012   MASS EXCISION N/A 10/24/2019   Procedure: EXCISION; CYST; RIGHT THIGH; PERINEUM;  Surgeon: Kallie Manuelita BROCKS, MD;  Location: [facility];  Service: General;  Laterality: N/A;   wrist  surgery     Family History  Problem Relation Age of Onset   Heart disease Mother    Colon polyps Father    Coronary artery disease Other    Colon polyps Sister    Colon cancer Neg Hx    Esophageal cancer Neg Hx    Rectal cancer Neg Hx    Stomach cancer Neg Hx     Current Outpatient Medications:    albuterol  (VENTOLIN  HFA) 108 (90 Base) MCG/ACT inhaler, INHALE 2 PUFFS INTO LUNGS EVERY 6 HOURS AS NEEDED FOR WHEEZING OR SHORTNESS OF BREATH, Disp: 18 g, Rfl: 0   aspirin  81 MG chewable tablet, Chew 81 mg by mouth daily., Disp: , Rfl:    fluticasone  (FLONASE ) 50 MCG/ACT nasal spray, Place 2 sprays into both nostrils daily., Disp: 16 g, Rfl: 6   nitroGLYCERIN  (NITROSTAT ) 0.4 MG SL tablet, Place 1 tablet (0.4 mg total) under the tongue every 5 (five) minutes x 3 doses as needed for chest pain., Disp: 25 tablet, Rfl: 0   amLODipine  (NORVASC ) 10 MG tablet, Take 1 tablet (10 mg total) by mouth daily., Disp: 90 tablet, Rfl: 4   atorvastatin  (LIPITOR) 80 MG tablet, Take 1 tablet (80 mg total) by mouth daily., Disp: 90 tablet, Rfl: 4   buPROPion  (WELLBUTRIN  XL) 150 MG 24 hr tablet, Take 1 tablet (150 mg total) by mouth every morning., Disp: 90 tablet, Rfl: 4   desloratadine  (CLARINEX ) 5 MG tablet, Take 1 tablet (5 mg total) by mouth daily as needed (allergies)., Disp: 90 tablet, Rfl: 4   furosemide  (LASIX ) 40 MG tablet, Take 1 tablet (40 mg total) by mouth daily., Disp:  90 tablet, Rfl: 4   hydrochlorothiazide  (HYDRODIURIL ) 25 MG tablet, Take 1 tablet (25 mg total) by mouth daily., Disp: 90 tablet, Rfl: 4   metoprolol  tartrate (LOPRESSOR ) 50 MG tablet, Take 1 tablet (50 mg total) by mouth 2 (two) times daily., Disp: 180 tablet, Rfl: 4   olmesartan  (BENICAR ) 40 MG tablet, Take 1 tablet (40 mg total) by mouth daily., Disp: 90 tablet, Rfl: 4   omeprazole  (PRILOSEC) 20 MG capsule, Take 1 capsule (20 mg total) by mouth daily., Disp: 90 capsule, Rfl: 4   Semaglutide , 2 MG/DOSE, 8 MG/3ML SOPN, Inject 2 mg as directed once a week., Disp: 9 mL, Rfl: 4  Allergies  Allergen Reactions   Metformin  And Related Nausea Only     ROS: Review of Systems Pertinent items noted in HPI and remainder of comprehensive ROS otherwise negative.    Physical exam BP 120/71   Pulse 78   Temp 97.9 F (36.6 C)   Ht 6' (1.829 m)   Wt 247 lb 4 oz (112.2 kg)   SpO2 96%   BMI 33.53 kg/m  General appearance: alert, cooperative, appears stated age, no distress, and moderately obese Head: Normocephalic, without obvious abnormality, atraumatic Eyes: negative findings: lids and lashes normal, conjunctivae and sclerae normal, corneas clear, and pupils equal, round, reactive to light and accomodation Ears: normal TM's and external ear canals both ears Nose: Nares normal. Septum midline. Mucosa normal. No drainage or sinus tenderness. Throat: Oropharynx without masses.  No sublingual masses.  Has false upper and lower teeth Neck: no adenopathy, no carotid bruit, supple, symmetrical, trachea midline, and thyroid  not enlarged, symmetric, no tenderness/mass/nodules Back: symmetric, no curvature. ROM normal. No CVA tenderness. Lungs: clear to auscultation bilaterally Chest wall: no tenderness Heart: regular rate and rhythm, S1, S2 normal, no murmur, click,  rub or gallop Abdomen: soft, non-tender; bowel sounds normal; no masses,  no organomegaly Extremities: extremities normal, atraumatic, no  cyanosis or edema Pulses: 2+ and symmetric Skin: Marble sized sebaceous cyst noted in the right flank.  He has a plum sized soft tissue mass consistent with lipoma in the right upper extremity just near the deltoid.  Multiple seborrheic keratoses noted throughout the back and trunk.  Has a healing area of ecchymosis along his left ring finger Lymph nodes: Supraclavicular and anterior cervical lymph nodes without enlargement Neurologic: Alert and oriented X 3, normal strength and tone. Normal symmetric reflexes. Normal coordination and gait      05/28/2023    3:28 PM 01/24/2023    3:51 PM 10/10/2022    3:20 PM  Depression screen PHQ 2/9  Decreased Interest 0 0 0  Down, Depressed, Hopeless 0 0 0  PHQ - 2 Score 0 0 0  Altered sleeping 0 0 0  Tired, decreased energy 0 0 0  Change in appetite 0 0 0  Feeling bad or failure about yourself  0 0 0  Trouble concentrating 0 0 0  Moving slowly or fidgety/restless 0 0 0  Suicidal thoughts 0 0 0  PHQ-9 Score 0  0  0   Difficult doing work/chores Not difficult at all Not difficult at all Not difficult at all     Data saved with a previous flowsheet row definition      05/28/2023    3:28 PM 01/24/2023    3:51 PM 05/30/2022    3:13 PM 03/27/2022   10:07 AM  GAD 7 : Generalized Anxiety Score  Nervous, Anxious, on Edge 0 0 0 0  Control/stop worrying 0 0 0 0  Worry too much - different things 0 0 0 0  Trouble relaxing 0 0 0 0  Restless 0 0 0 0  Easily annoyed or irritable 0 0 0 0  Afraid - awful might happen 0 0 0 0  Total GAD 7 Score 0 0 0 0  Anxiety Difficulty Not difficult at all Not difficult at all Not difficult at all Not difficult at all    No results found for this or any previous visit (from the past 2160 hours).   Assessment/ Plan: Scott Hatfield here for annual physical exam.   Annual physical exam  Lipoma of right upper extremity - Plan: Ambulatory referral to General Surgery  Sebaceous cyst - Plan: Ambulatory referral to  General Surgery  Diabetes mellitus treated with injections of non-insulin medication (HCC) - Plan: CMP14+EGFR, Bayer DCA Hb A1c Waived, Microalbumin / creatinine urine ratio, Semaglutide , 2 MG/DOSE, 8 MG/3ML SOPN  Hyperlipidemia associated with type 2 diabetes mellitus (HCC) - Plan: CMP14+EGFR, Lipid Panel, TSH, Semaglutide , 2 MG/DOSE, 8 MG/3ML SOPN  Hypertension associated with type 2 diabetes mellitus (HCC) - Plan: CMP14+EGFR, amLODipine  (NORVASC ) 10 MG tablet, furosemide  (LASIX ) 40 MG tablet, hydrochlorothiazide  (HYDRODIURIL ) 25 MG tablet, metoprolol  tartrate (LOPRESSOR ) 50 MG tablet, olmesartan  (BENICAR ) 40 MG tablet, Semaglutide , 2 MG/DOSE, 8 MG/3ML SOPN  Obesity (BMI 30.0-34.9) - Plan: CMP14+EGFR, VITAMIN D  25 Hydroxy (Vit-D Deficiency, Fractures), buPROPion  (WELLBUTRIN  XL) 150 MG 24 hr tablet  Current every day smoker - Plan: CMP14+EGFR, CBC with Differential, buPROPion  (WELLBUTRIN  XL) 150 MG 24 hr tablet, Ambulatory Referral Lung Cancer Screening  Pulmonary  CAD S/P percutaneous coronary angioplasty - Plan: CMP14+EGFR, Lipid Panel, TSH, amLODipine  (NORVASC ) 10 MG tablet, metoprolol  tartrate (LOPRESSOR ) 50 MG tablet, Semaglutide , 2 MG/DOSE, 8 MG/3ML SOPN  Seasonal allergic rhinitis due  to pollen - Plan: CMP14+EGFR, CBC with Differential, desloratadine  (CLARINEX ) 5 MG tablet  Esophagitis - Plan: CBC with Differential, omeprazole  (PRILOSEC) 20 MG capsule  Dribbling following urination - Plan: PSA  Adenomatous polyps - Plan: Ambulatory referral to Gastroenterology  Sessile serrated polyp of colon - Plan: Ambulatory referral to Gastroenterology   Stressed the importance of follow-up on his precancerous polyps.  I placed referral back to his gastroenterologist for repeat colonoscopy.  Referral placed for lung cancer screening as well as he is overdue for this.  I have renewed all medications as his other medical issues are chronic and stable  With regards to his diabetes, he will  get his eye exam set up.  Urine microalbumin collected along with fasting labs today.  I have placed referral to Dr. Kallie for consideration of excision of lipoma and cyst.  Influenza vaccination administered.  Declined shingles and pneumonia shot today  Counseled on healthy lifestyle choices, including diet (rich in fruits, vegetables and lean meats and low in salt and simple carbohydrates) and exercise (at least 30 minutes of moderate physical activity daily).  Patient to follow up 53m for DM  Na Waldrip M. Jolinda, DO

## 2024-01-01 ENCOUNTER — Ambulatory Visit: Payer: Self-pay | Admitting: Family Medicine

## 2024-01-01 DIAGNOSIS — E559 Vitamin D deficiency, unspecified: Secondary | ICD-10-CM

## 2024-01-01 LAB — VITAMIN D 25 HYDROXY (VIT D DEFICIENCY, FRACTURES): Vit D, 25-Hydroxy: 27.7 ng/mL — AB (ref 30.0–100.0)

## 2024-01-01 LAB — CBC WITH DIFFERENTIAL/PLATELET
Basophils Absolute: 0.1 x10E3/uL (ref 0.0–0.2)
Basos: 1 %
EOS (ABSOLUTE): 0.5 x10E3/uL — ABNORMAL HIGH (ref 0.0–0.4)
Eos: 7 %
Hematocrit: 50 % (ref 37.5–51.0)
Hemoglobin: 16.8 g/dL (ref 13.0–17.7)
Immature Grans (Abs): 0 x10E3/uL (ref 0.0–0.1)
Immature Granulocytes: 0 %
Lymphocytes Absolute: 2.1 x10E3/uL (ref 0.7–3.1)
Lymphs: 29 %
MCH: 32.6 pg (ref 26.6–33.0)
MCHC: 33.6 g/dL (ref 31.5–35.7)
MCV: 97 fL (ref 79–97)
Monocytes Absolute: 0.8 x10E3/uL (ref 0.1–0.9)
Monocytes: 11 %
Neutrophils Absolute: 3.7 x10E3/uL (ref 1.4–7.0)
Neutrophils: 52 %
Platelets: 199 x10E3/uL (ref 150–450)
RBC: 5.15 x10E6/uL (ref 4.14–5.80)
RDW: 12 % (ref 11.6–15.4)
WBC: 7.2 x10E3/uL (ref 3.4–10.8)

## 2024-01-01 LAB — CMP14+EGFR
ALT: 20 IU/L (ref 0–44)
AST: 13 IU/L (ref 0–40)
Albumin: 4.2 g/dL (ref 3.9–4.9)
Alkaline Phosphatase: 75 IU/L (ref 47–123)
BUN/Creatinine Ratio: 14 (ref 10–24)
BUN: 14 mg/dL (ref 8–27)
Bilirubin Total: 0.7 mg/dL (ref 0.0–1.2)
CO2: 22 mmol/L (ref 20–29)
Calcium: 9.3 mg/dL (ref 8.6–10.2)
Chloride: 103 mmol/L (ref 96–106)
Creatinine, Ser: 0.99 mg/dL (ref 0.76–1.27)
Globulin, Total: 2.4 g/dL (ref 1.5–4.5)
Glucose: 94 mg/dL (ref 70–99)
Potassium: 4.3 mmol/L (ref 3.5–5.2)
Sodium: 141 mmol/L (ref 134–144)
Total Protein: 6.6 g/dL (ref 6.0–8.5)
eGFR: 86 mL/min/1.73 (ref >59)

## 2024-01-01 LAB — LIPID PANEL
Chol/HDL Ratio: 3.1 ratio (ref 0.0–5.0)
Cholesterol, Total: 107 mg/dL (ref 100–199)
HDL: 34 mg/dL — ABNORMAL LOW (ref >39)
LDL Chol Calc (NIH): 56 mg/dL (ref 0–99)
Triglycerides: 84 mg/dL (ref 0–149)
VLDL Cholesterol Cal: 17 mg/dL (ref 5–40)

## 2024-01-01 LAB — PSA: Prostate Specific Ag, Serum: 0.3 ng/mL (ref 0.0–4.0)

## 2024-01-01 LAB — TSH: TSH: 2.41 u[IU]/mL (ref 0.450–4.500)

## 2024-01-01 MED ORDER — VITAMIN D (ERGOCALCIFEROL) 1.25 MG (50000 UNIT) PO CAPS: 50000.0000 [IU] | ORAL_CAPSULE | ORAL | 0 refills | Status: AC

## 2024-01-16 ENCOUNTER — Other Ambulatory Visit (HOSPITAL_COMMUNITY): Payer: Self-pay

## 2024-01-16 ENCOUNTER — Telehealth: Payer: Self-pay | Admitting: Pharmacy Technician

## 2024-01-16 NOTE — Telephone Encounter (Signed)
 Pharmacy Patient Advocate Encounter   Received notification from Onbase that prior authorization for Ozempic  2mg  is required/requested.   Insurance verification completed.   The patient is insured through [employer].   Per test claim: PA required; PA submitted to above mentioned insurance via Prompt PA Key/confirmation #/EOC [id number] Status is pending

## 2024-01-17 ENCOUNTER — Other Ambulatory Visit (HOSPITAL_COMMUNITY): Payer: Self-pay

## 2024-01-18 ENCOUNTER — Other Ambulatory Visit (HOSPITAL_COMMUNITY): Payer: Self-pay

## 2024-01-18 NOTE — Telephone Encounter (Signed)
 Pharmacy Patient Advocate Encounter  Received notification from [employer] that Prior Authorization for Ozempic  2mg   has been already been approved and this PA request was cancelled   PA #/Case ID/Reference #: Please see letter from [employer] in patient's media tab for questions.   After test billing Ozempic  it's refill too soon until 02/04/2024

## 2024-02-05 ENCOUNTER — Ambulatory Visit: Admitting: General Surgery

## 2024-02-05 ENCOUNTER — Encounter: Payer: Self-pay | Admitting: General Surgery

## 2024-02-05 VITALS — BP 120/78 | HR 81 | Temp 97.9°F | Resp 14 | Ht 72.0 in | Wt 250.0 lb

## 2024-02-05 DIAGNOSIS — L720 Epidermal cyst: Secondary | ICD-10-CM | POA: Diagnosis not present

## 2024-02-05 DIAGNOSIS — D1721 Benign lipomatous neoplasm of skin and subcutaneous tissue of right arm: Secondary | ICD-10-CM

## 2024-02-05 NOTE — Progress Notes (Signed)
 [facility] History and Physical  Reason for Referral: Cyst on right thigh, cyst on back, and lipoma on right upper arm Referring Physician: Jolinda Norene HERO, DO   Chief Complaint   New Patient (Initial Visit)     Scott Hatfield is a 63 y.o. male.  HPI:   Discussed the use of AI scribe software for clinical note transcription with the patient, who gave verbal consent to proceed.  History of Present Illness Scott Hatfield is a 63 year old male who presents for evaluation of a right upper arm lipoma and epidermal cysts of the back and right thigh.  He reports a mass on the right upper arm that has increased in size since his last visit.  He has not received prior treatment and is considering excision due to progressive enlargement.  He reports a cyst on his back. The cyst has drained once, releasing thick white keratinous material, but has not required antibiotics and has not caused recurrent infection. The lesion persists and is bothersome due to its presence.  He reports a lesion on the inner right thigh that recurs every few months. The lesion intermittently drains and ruptures, but consistently recurs. He has not required antibiotics and has not experienced significant complications aside from recurrence.     Past Medical History:  Diagnosis Date   Allergy    occasionally    Asthmatic bronchitis with acute exacerbation 03/23/2022   Flared dec 2023 > quit smoking 02/2022 and stopped all inhalers late Jan 2024 s recurrence   - 03/23/2022  After extensive coaching inhaler device,  effectiveness =    60%(short ti)   - PFT's  06/26/2022   FEV1 2.84 (74 % ) ratio 0.79  p 5 % improvement from saba p nothing prior to study with   FV curve min concavity to exp curve  and ERV 10% at wt 289        Benign lipomatous neoplasm of skin, subcu of right arm 09/05/2018   CAD in native artery    a. 2008 - Single-vessel CAD with stent placement to the circumflex  b. 2012  - DES to Diag.  c. 06/2014 - RCA 25% stenosis, LAD 30% stenosis, ramus intermediate 45% stenosis, D1 95% stenosis treated with DES  d.01/2015 - nonobstructive CAD   Epidermoid cyst of skin of back 03/27/2022   GERD (gastroesophageal reflux disease)    Hyperlipidemia, mixed    Hypertension    Myocardial infarction (HCC) 2008   Overweight(278.02)    Sebaceous cyst 09/05/2018   right inner thigh     Sleep apnea    CPAP    Past Surgical History:  Procedure Laterality Date   CARDIAC CATHETERIZATION N/A 07/03/2014   Procedure: Left Heart Cath and Coronary Angiography;  Surgeon: Peter M Jordan, MD;  Location: [facility] INVASIVE CV LAB;  Service: Cardiovascular;  Laterality: N/A;   CARDIAC CATHETERIZATION N/A 07/03/2014   Procedure: Coronary Stent Intervention;  Surgeon: Peter M Jordan, MD;  Location: [facility] INVASIVE CV LAB;  Service: Cardiovascular;  Laterality: N/A;   CARDIAC CATHETERIZATION N/A 01/26/2015   Procedure: Left Heart Cath and Coronary Angiography;  Surgeon: Peter M Jordan, MD;  Location: [facility] INVASIVE CV LAB;  Service: Cardiovascular;  Laterality: N/A;   CORONARY ANGIOPLASTY     2016     3 stents   CORONARY STENT PLACEMENT  2008, 2012   MASS EXCISION N/A 10/24/2019   Procedure: EXCISION; CYST; RIGHT THIGH; PERINEUM;  Surgeon: Kallie Manuelita JAYSON, MD;  Location: [facility];  Service: General;  Laterality: N/A;   wrist  surgery      Family History  Problem Relation Age of Onset   Heart disease Mother    Colon polyps Father    Colon polyps Sister    Coronary artery disease Other    Colon cancer Neg Hx    Esophageal cancer Neg Hx    Rectal cancer Neg Hx    Stomach cancer Neg Hx     Social History[1]  Medications: I have reviewed the patient's current medications. Allergies as of 02/05/2024       Reactions   Metformin  And Related Nausea Only        Medication List        Accurate as of February 05, 2024  9:05 AM. If you have any questions, ask your nurse or doctor.           albuterol  108 (90 Base) MCG/ACT inhaler Commonly known as: VENTOLIN  HFA INHALE 2 PUFFS INTO LUNGS EVERY 6 HOURS AS NEEDED FOR WHEEZING OR SHORTNESS OF BREATH   amLODipine  10 MG tablet Commonly known as: NORVASC  Take 1 tablet (10 mg total) by mouth daily.   aspirin  81 MG chewable tablet Chew 81 mg by mouth daily.   atorvastatin  80 MG tablet Commonly known as: LIPITOR Take 1 tablet (80 mg total) by mouth daily.   buPROPion  150 MG 24 hr tablet Commonly known as: WELLBUTRIN  XL Take 1 tablet (150 mg total) by mouth every morning.   desloratadine  5 MG tablet Commonly known as: Clarinex  Take 1 tablet (5 mg total) by mouth daily as needed (allergies).   fluticasone  50 MCG/ACT nasal spray Commonly known as: FLONASE  Place 2 sprays into both nostrils daily.   furosemide  40 MG tablet Commonly known as: LASIX  Take 1 tablet (40 mg total) by mouth daily.   hydrochlorothiazide  25 MG tablet Commonly known as: HYDRODIURIL  Take 1 tablet (25 mg total) by mouth daily.   metoprolol  tartrate 50 MG tablet Commonly known as: LOPRESSOR  Take 1 tablet (50 mg total) by mouth 2 (two) times daily.   nitroGLYCERIN  0.4 MG SL tablet Commonly known as: NITROSTAT  Place 1 tablet (0.4 mg total) under the tongue every 5 (five) minutes x 3 doses as needed for chest pain.   olmesartan  40 MG tablet Commonly known as: BENICAR  Take 1 tablet (40 mg total) by mouth daily.   omeprazole  20 MG capsule Commonly known as: PRILOSEC Take 1 capsule (20 mg total) by mouth daily.   Semaglutide  (2 MG/DOSE) 8 MG/3ML Sopn Inject 2 mg as directed once a week.   Vitamin D  (Ergocalciferol ) 1.25 MG (50000 UNIT) Caps capsule Commonly known as: DRISDOL  Take 1 capsule (50,000 Units total) by mouth every 7 (seven) days. X12 weeks. Then switch to OTC vit d 400IU daily.         ROS:  A comprehensive review of systems was negative except for: Musculoskeletal: positive for right upper arm lipoma, right thigh cyst and  back cyst   Blood pressure 120/78, pulse 81, temperature 97.9 F (36.6 C), temperature source Oral, resp. rate 14, height 6' (1.829 m), weight 250 lb (113.4 kg), SpO2 91%. Physical Exam Physical Exam GENERAL: Alert, cooperative, well developed, no acute distress. HEENT: Normocephalic, normal oropharynx, moist mucous membranes. CHEST: Clear to auscultation bilaterally, no wheezes, rhonchi, or crackles. CARDIOVASCULAR: Normal heart rate and rhythm ABDOMEN: Soft, non-tender, non-distended, without organomegaly, normal bowel sounds. EXTREMITIES: No cyanosis or edema. MUSCULOSKELETAL: 4-5 cm mobile lipoma on arm,  not attached to muscle. 1 cm sebaceous cyst on back with a head. Small persistent superficial cyst on leg, less than 1 cm. NEUROLOGICAL: Cranial nerves grossly intact, moves all extremities without gross motor or sensory deficit.  Results: None   Assessment & Plan Lipoma of right upper arm 4-5 cm mobile subcutaneous lipoma, superficial, not adherent to muscle, increased in size. Excision indicated for cosmetic reasons or if bothersome. - Explained excision options: office-based under local anesthesia or operating room with sedation. -Discussed risk of bleeding, infection, recurrence.   Epidermal cyst of back 1 cm superficial epidermal cyst with visible punctum, previously drained keratinous material. Suitable for excision with increased infection risk due to keratinous content. - Explained excision technique: elliptical incision, cyst removal, closure with non-absorbable sutures. - Reviewed increased infection risk due to keratinous material. - Planned to send excised tissue for pathology. - Sutures to be left in and removed later due to back location. - Discussed excision options: office-based or operating room, with possible combination with thigh cyst. -Discussed risk of bleeding, infection, recurrence.   Epidermal cyst of right thigh Small superficial epidermal cyst on inner  right thigh, intermittently draining, recurrent, no present infection. Excision appropriate. - Explained excision technique: elliptical incision, possible placement of a stitch or two, use of dressing. - Non-absorbable sutures may be used to allow for drainage if indicated. - Discussed excision options: office-based (possibly combined with back cyst) or operating room with other lesions. - Offered to check scheduling for office-based excision and coordinate per his preference.   He wants to proceed with the office procedures, will plan for procedures for the cyst and then suture removal at time of lipoma excision.  Future Appointments  Date Time Provider Department Center  03/27/2024  3:00 PM Kallie Manuelita BROCKS, MD [facility] None  04/10/2024  2:00 PM Kallie Manuelita BROCKS, MD [facility] None  04/23/2024  1:00 PM Kallie Manuelita BROCKS, MD [facility] None  06/30/2024  8:00 AM Jolinda Norene HERO, DO [facility] [phone]     All questions were answered to the satisfaction of the patient.   Manuelita BROCKS Kallie 02/05/2024, 9:05 AM           [1]  Social History Tobacco Use   Smoking status: Former    Current packs/day: 0.00    Types: Cigarettes    Start date: 03/20/2012    Quit date: 02/15/2022    Years since quitting: 1.9   Smokeless tobacco: Never  Vaping Use   Vaping status: Never Used  Substance Use Topics   Alcohol use: Yes    Alcohol/week: 0.0 standard drinks of alcohol    Comment: occ   Drug use: No

## 2024-02-05 NOTE — Patient Instructions (Signed)
 No need to hold any medication prior to the procedure.  You can take Tylenol  before the procedure.

## 2024-02-26 ENCOUNTER — Other Ambulatory Visit: Payer: Self-pay | Admitting: *Deleted

## 2024-02-26 DIAGNOSIS — E1169 Type 2 diabetes mellitus with other specified complication: Secondary | ICD-10-CM

## 2024-02-26 DIAGNOSIS — E119 Type 2 diabetes mellitus without complications: Secondary | ICD-10-CM

## 2024-02-26 DIAGNOSIS — E1159 Type 2 diabetes mellitus with other circulatory complications: Secondary | ICD-10-CM

## 2024-02-26 DIAGNOSIS — I2510 Atherosclerotic heart disease of native coronary artery without angina pectoris: Secondary | ICD-10-CM

## 2024-02-26 MED ORDER — SEMAGLUTIDE (2 MG/DOSE) 8 MG/3ML ~~LOC~~ SOPN: 2.0000 mg | PEN_INJECTOR | SUBCUTANEOUS | 1 refills | Status: AC

## 2024-03-27 ENCOUNTER — Ambulatory Visit: Admitting: General Surgery

## 2024-04-10 ENCOUNTER — Ambulatory Visit: Admitting: General Surgery

## 2024-04-23 ENCOUNTER — Encounter: Admitting: General Surgery

## 2024-06-30 ENCOUNTER — Ambulatory Visit: Admitting: Family Medicine
